# Patient Record
Sex: Male | Born: 1955
Health system: Southern US, Community
[De-identification: ages and names within clinical notes are randomized; demographics above are authoritative.]

## PROBLEM LIST (undated history)

## (undated) DIAGNOSIS — I495 Sick sinus syndrome: Secondary | ICD-10-CM

## (undated) DIAGNOSIS — E785 Hyperlipidemia, unspecified: Secondary | ICD-10-CM

## (undated) DIAGNOSIS — E119 Type 2 diabetes mellitus without complications: Secondary | ICD-10-CM

## (undated) DIAGNOSIS — D649 Anemia, unspecified: Secondary | ICD-10-CM

## (undated) DIAGNOSIS — I10 Essential (primary) hypertension: Secondary | ICD-10-CM

## (undated) DIAGNOSIS — Z9289 Personal history of other medical treatment: Secondary | ICD-10-CM

## (undated) DIAGNOSIS — K219 Gastro-esophageal reflux disease without esophagitis: Secondary | ICD-10-CM

## (undated) DIAGNOSIS — K8020 Calculus of gallbladder without cholecystitis without obstruction: Secondary | ICD-10-CM

## (undated) DIAGNOSIS — I480 Paroxysmal atrial fibrillation: Secondary | ICD-10-CM

## (undated) DIAGNOSIS — I2510 Atherosclerotic heart disease of native coronary artery without angina pectoris: Secondary | ICD-10-CM

## (undated) HISTORY — DX: Paroxysmal atrial fibrillation: I48.0

## (undated) HISTORY — DX: Sick sinus syndrome: I49.5

## (undated) HISTORY — DX: Gastro-esophageal reflux disease without esophagitis: K21.9

## (undated) HISTORY — DX: Calculus of gallbladder without cholecystitis without obstruction: K80.20

## (undated) HISTORY — DX: Personal history of other medical treatment: Z92.89

## (undated) HISTORY — DX: Anemia, unspecified: D64.9

## (undated) HISTORY — DX: Type 2 diabetes mellitus without complications: E11.9

## (undated) HISTORY — PX: OTHER SURGICAL HISTORY: SHX169

## (undated) HISTORY — PX: PACEMAKER INSERTION: SHX728

## (undated) HISTORY — DX: Essential (primary) hypertension: I10

## (undated) HISTORY — DX: Atherosclerotic heart disease of native coronary artery without angina pectoris: I25.10

## (undated) HISTORY — DX: Hyperlipidemia, unspecified: E78.5

## (undated) HISTORY — PX: CORONARY ARTERY BYPASS GRAFT: SHX141

---

## 2001-09-22 ENCOUNTER — Encounter: Payer: Self-pay | Admitting: General Surgery

## 2001-09-29 ENCOUNTER — Ambulatory Visit (HOSPITAL_COMMUNITY): Admission: RE | Admit: 2001-09-29 | Discharge: 2001-09-29 | Payer: Self-pay | Admitting: General Surgery

## 2001-09-29 ENCOUNTER — Encounter (INDEPENDENT_AMBULATORY_CARE_PROVIDER_SITE_OTHER): Payer: Self-pay | Admitting: *Deleted

## 2002-01-12 ENCOUNTER — Ambulatory Visit (HOSPITAL_COMMUNITY): Admission: RE | Admit: 2002-01-12 | Discharge: 2002-01-12 | Payer: Self-pay | Admitting: General Surgery

## 2002-01-12 ENCOUNTER — Encounter (INDEPENDENT_AMBULATORY_CARE_PROVIDER_SITE_OTHER): Payer: Self-pay | Admitting: *Deleted

## 2002-04-23 ENCOUNTER — Observation Stay (HOSPITAL_COMMUNITY): Admission: RE | Admit: 2002-04-23 | Discharge: 2002-04-24 | Payer: Self-pay | Admitting: General Surgery

## 2003-03-25 ENCOUNTER — Ambulatory Visit (HOSPITAL_COMMUNITY): Admission: RE | Admit: 2003-03-25 | Discharge: 2003-03-25 | Payer: Self-pay | Admitting: General Surgery

## 2006-08-25 ENCOUNTER — Inpatient Hospital Stay (HOSPITAL_COMMUNITY): Admission: EM | Admit: 2006-08-25 | Discharge: 2006-09-03 | Payer: Self-pay | Admitting: Emergency Medicine

## 2006-08-25 ENCOUNTER — Ambulatory Visit: Payer: Self-pay | Admitting: Cardiology

## 2006-08-26 ENCOUNTER — Encounter: Payer: Self-pay | Admitting: Cardiothoracic Surgery

## 2006-08-26 ENCOUNTER — Ambulatory Visit: Payer: Self-pay | Admitting: Cardiothoracic Surgery

## 2006-08-29 ENCOUNTER — Encounter: Payer: Self-pay | Admitting: Cardiology

## 2006-09-16 ENCOUNTER — Ambulatory Visit: Payer: Self-pay | Admitting: Cardiothoracic Surgery

## 2006-09-16 ENCOUNTER — Encounter: Admission: RE | Admit: 2006-09-16 | Discharge: 2006-09-16 | Payer: Self-pay | Admitting: Cardiothoracic Surgery

## 2006-09-19 ENCOUNTER — Ambulatory Visit: Payer: Self-pay | Admitting: Cardiology

## 2006-11-11 ENCOUNTER — Ambulatory Visit: Payer: Self-pay | Admitting: Cardiology

## 2006-11-11 LAB — CONVERTED CEMR LAB
ALT: 28 units/L (ref 0–53)
AST: 19 units/L (ref 0–37)
Albumin: 3.9 g/dL (ref 3.5–5.2)
Alkaline Phosphatase: 50 units/L (ref 39–117)
Bilirubin, Direct: 0.1 mg/dL (ref 0.0–0.3)
Cholesterol: 148 mg/dL (ref 0–200)
Direct LDL: 76.4 mg/dL
HDL: 42.9 mg/dL (ref >39.0)
Total Bilirubin: 0.6 mg/dL (ref 0.3–1.2)
Total CHOL/HDL Ratio: 3.4
Total Protein: 7.6 g/dL (ref 6.0–8.3)
Triglycerides: 201 mg/dL (ref 0–149)
VLDL: 40 mg/dL (ref 0–40)

## 2006-11-18 ENCOUNTER — Ambulatory Visit: Payer: Self-pay | Admitting: Cardiology

## 2006-12-02 ENCOUNTER — Encounter: Payer: Self-pay | Admitting: Cardiology

## 2006-12-02 ENCOUNTER — Ambulatory Visit: Payer: Self-pay

## 2007-05-19 ENCOUNTER — Ambulatory Visit: Payer: Self-pay | Admitting: Cardiology

## 2007-07-11 ENCOUNTER — Ambulatory Visit: Payer: Self-pay | Admitting: Cardiology

## 2007-07-21 ENCOUNTER — Ambulatory Visit: Payer: Self-pay

## 2007-12-15 ENCOUNTER — Ambulatory Visit: Payer: Self-pay | Admitting: Cardiology

## 2007-12-15 LAB — CONVERTED CEMR LAB
ALT: 27 units/L (ref 0–53)
AST: 19 units/L (ref 0–37)
Albumin: 3.6 g/dL (ref 3.5–5.2)
Alkaline Phosphatase: 40 units/L (ref 39–117)
Bilirubin, Direct: 0.1 mg/dL (ref 0.0–0.3)
Cholesterol: 138 mg/dL (ref 0–200)
HDL: 42.2 mg/dL (ref >39.0)
LDL Cholesterol: 62 mg/dL (ref 0–99)
Total Bilirubin: 0.7 mg/dL (ref 0.3–1.2)
Total CHOL/HDL Ratio: 3.3
Total Protein: 7.4 g/dL (ref 6.0–8.3)
Triglycerides: 168 mg/dL — ABNORMAL HIGH (ref 0–149)
VLDL: 34 mg/dL (ref 0–40)

## 2007-12-29 ENCOUNTER — Ambulatory Visit: Payer: Self-pay | Admitting: Cardiology

## 2008-01-11 ENCOUNTER — Ambulatory Visit: Payer: Self-pay | Admitting: Cardiology

## 2008-01-11 LAB — CONVERTED CEMR LAB
BUN: 17 mg/dL (ref 6–23)
CO2: 28 meq/L (ref 19–32)
Calcium: 10.2 mg/dL (ref 8.4–10.5)
Chloride: 101 meq/L (ref 96–112)
Creatinine, Ser: 0.9 mg/dL (ref 0.4–1.5)
GFR calc Af Amer: 114 mL/min
GFR calc non Af Amer: 94 mL/min
Glucose, Bld: 122 mg/dL — ABNORMAL HIGH (ref 70–99)
Potassium: 5.2 meq/L — ABNORMAL HIGH (ref 3.5–5.1)
Sodium: 137 meq/L (ref 135–145)

## 2008-02-02 ENCOUNTER — Ambulatory Visit: Payer: Self-pay | Admitting: Cardiology

## 2008-02-02 LAB — CONVERTED CEMR LAB
BUN: 13 mg/dL (ref 6–23)
CO2: 29 meq/L (ref 19–32)
Calcium: 9.7 mg/dL (ref 8.4–10.5)
Chloride: 105 meq/L (ref 96–112)
Creatinine, Ser: 1 mg/dL (ref 0.4–1.5)
GFR calc Af Amer: 101 mL/min
GFR calc non Af Amer: 83 mL/min
Glucose, Bld: 119 mg/dL — ABNORMAL HIGH (ref 70–99)
Potassium: 4.4 meq/L (ref 3.5–5.1)
Sodium: 139 meq/L (ref 135–145)

## 2008-02-09 ENCOUNTER — Ambulatory Visit: Payer: Self-pay | Admitting: Internal Medicine

## 2008-02-09 DIAGNOSIS — K219 Gastro-esophageal reflux disease without esophagitis: Secondary | ICD-10-CM | POA: Insufficient documentation

## 2008-02-09 DIAGNOSIS — I252 Old myocardial infarction: Secondary | ICD-10-CM | POA: Insufficient documentation

## 2008-02-09 DIAGNOSIS — E785 Hyperlipidemia, unspecified: Secondary | ICD-10-CM | POA: Insufficient documentation

## 2008-02-09 DIAGNOSIS — J019 Acute sinusitis, unspecified: Secondary | ICD-10-CM | POA: Insufficient documentation

## 2008-02-09 DIAGNOSIS — I495 Sick sinus syndrome: Secondary | ICD-10-CM | POA: Insufficient documentation

## 2008-02-09 DIAGNOSIS — I2510 Atherosclerotic heart disease of native coronary artery without angina pectoris: Secondary | ICD-10-CM | POA: Insufficient documentation

## 2008-02-09 DIAGNOSIS — I119 Hypertensive heart disease without heart failure: Secondary | ICD-10-CM | POA: Insufficient documentation

## 2008-02-09 LAB — CONVERTED CEMR LAB
ALT: 41 units/L (ref 0–53)
AST: 28 units/L (ref 0–37)
Albumin: 3.8 g/dL (ref 3.5–5.2)
Alkaline Phosphatase: 52 units/L (ref 39–117)
BUN: 13 mg/dL (ref 6–23)
Basophils Absolute: 0.1 10*3/uL (ref 0.0–0.1)
Basophils Relative: 1 % (ref 0.0–3.0)
Bilirubin Urine: NEGATIVE
Bilirubin, Direct: 0.1 mg/dL (ref 0.0–0.3)
CO2: 28 meq/L (ref 19–32)
Calcium: 9.9 mg/dL (ref 8.4–10.5)
Chloride: 106 meq/L (ref 96–112)
Cholesterol: 134 mg/dL (ref 0–200)
Creatinine, Ser: 1 mg/dL (ref 0.4–1.5)
Eosinophils Absolute: 0.3 10*3/uL (ref 0.0–0.7)
Eosinophils Relative: 3.5 % (ref 0.0–5.0)
GFR calc Af Amer: 101 mL/min
GFR calc non Af Amer: 83 mL/min
Glucose, Bld: 141 mg/dL — ABNORMAL HIGH (ref 70–99)
HCT: 40.7 % (ref 39.0–52.0)
HDL: 52.7 mg/dL (ref >39.0)
Hemoglobin, Urine: NEGATIVE
Hemoglobin: 13.9 g/dL (ref 13.0–17.0)
Hgb A1c MFr Bld: 6.4 % — ABNORMAL HIGH (ref 4.6–6.0)
Ketones, ur: NEGATIVE mg/dL
LDL Cholesterol: 55 mg/dL (ref 0–99)
Leukocytes, UA: NEGATIVE
Lymphocytes Relative: 33.8 % (ref 12.0–46.0)
MCHC: 34.2 g/dL (ref 30.0–36.0)
MCV: 92.7 fL (ref 78.0–100.0)
Monocytes Absolute: 0.8 10*3/uL (ref 0.1–1.0)
Monocytes Relative: 9.8 % (ref 3.0–12.0)
Neutro Abs: 4.3 10*3/uL (ref 1.4–7.7)
Neutrophils Relative %: 51.9 % (ref 43.0–77.0)
Nitrite: NEGATIVE
PSA: 0.26 ng/mL (ref 0.10–4.00)
Platelets: 235 10*3/uL (ref 150–400)
Potassium: 5.1 meq/L (ref 3.5–5.1)
RBC: 4.39 M/uL (ref 4.22–5.81)
RDW: 11.8 % (ref 11.5–14.6)
Sodium: 139 meq/L (ref 135–145)
Specific Gravity, Urine: 1.015 (ref 1.000–1.03)
TSH: 2.44 microintl units/mL (ref 0.35–5.50)
Total Bilirubin: 0.5 mg/dL (ref 0.3–1.2)
Total CHOL/HDL Ratio: 2.5
Total Protein, Urine: NEGATIVE mg/dL
Total Protein: 7.3 g/dL (ref 6.0–8.3)
Triglycerides: 133 mg/dL (ref 0–149)
Urine Glucose: NEGATIVE mg/dL
Urobilinogen, UA: 0.2 (ref 0.0–1.0)
VLDL: 27 mg/dL (ref 0–40)
WBC: 8.3 10*3/uL (ref 4.5–10.5)
pH: 5 (ref 5.0–8.0)

## 2008-03-01 ENCOUNTER — Ambulatory Visit: Payer: Self-pay | Admitting: Internal Medicine

## 2008-03-01 DIAGNOSIS — E119 Type 2 diabetes mellitus without complications: Secondary | ICD-10-CM | POA: Insufficient documentation

## 2008-04-05 ENCOUNTER — Ambulatory Visit: Payer: Self-pay | Admitting: Gastroenterology

## 2008-04-26 ENCOUNTER — Ambulatory Visit: Payer: Self-pay | Admitting: Gastroenterology

## 2008-04-26 LAB — HM COLONOSCOPY

## 2008-08-23 ENCOUNTER — Ambulatory Visit: Payer: Self-pay | Admitting: Internal Medicine

## 2008-08-23 LAB — CONVERTED CEMR LAB
BUN: 21 mg/dL (ref 6–23)
CO2: 28 meq/L (ref 19–32)
Calcium: 9.8 mg/dL (ref 8.4–10.5)
Chloride: 105 meq/L (ref 96–112)
Cholesterol: 141 mg/dL (ref 0–200)
Creatinine, Ser: 1.1 mg/dL (ref 0.4–1.5)
GFR calc non Af Amer: 74 mL/min (ref >60)
Glucose, Bld: 115 mg/dL — ABNORMAL HIGH (ref 70–99)
HDL: 53.6 mg/dL (ref >39.00)
Hgb A1c MFr Bld: 6.1 % (ref 4.6–6.5)
LDL Cholesterol: 61 mg/dL (ref 0–99)
Potassium: 5.1 meq/L (ref 3.5–5.1)
Sodium: 141 meq/L (ref 135–145)
Total CHOL/HDL Ratio: 3
Triglycerides: 130 mg/dL (ref 0.0–149.0)
VLDL: 26 mg/dL (ref 0.0–40.0)

## 2008-08-30 ENCOUNTER — Ambulatory Visit: Payer: Self-pay | Admitting: Internal Medicine

## 2008-08-30 DIAGNOSIS — R079 Chest pain, unspecified: Secondary | ICD-10-CM | POA: Insufficient documentation

## 2008-10-02 ENCOUNTER — Ambulatory Visit: Payer: Self-pay | Admitting: Internal Medicine

## 2008-10-02 DIAGNOSIS — R42 Dizziness and giddiness: Secondary | ICD-10-CM | POA: Insufficient documentation

## 2008-11-09 ENCOUNTER — Inpatient Hospital Stay (HOSPITAL_COMMUNITY): Admission: EM | Admit: 2008-11-09 | Discharge: 2008-11-12 | Payer: Self-pay | Admitting: Internal Medicine

## 2008-11-09 ENCOUNTER — Telehealth: Payer: Self-pay | Admitting: Family Medicine

## 2008-11-09 ENCOUNTER — Ambulatory Visit: Payer: Self-pay | Admitting: Diagnostic Radiology

## 2008-11-09 ENCOUNTER — Encounter: Payer: Self-pay | Admitting: Emergency Medicine

## 2008-11-09 ENCOUNTER — Ambulatory Visit: Payer: Self-pay | Admitting: Cardiovascular Disease

## 2008-11-11 ENCOUNTER — Encounter: Payer: Self-pay | Admitting: Internal Medicine

## 2008-11-12 ENCOUNTER — Encounter: Payer: Self-pay | Admitting: Internal Medicine

## 2008-11-18 ENCOUNTER — Encounter: Payer: Self-pay | Admitting: Internal Medicine

## 2008-11-26 ENCOUNTER — Telehealth (INDEPENDENT_AMBULATORY_CARE_PROVIDER_SITE_OTHER): Payer: Self-pay | Admitting: *Deleted

## 2008-11-27 ENCOUNTER — Encounter (HOSPITAL_COMMUNITY): Admission: RE | Admit: 2008-11-27 | Discharge: 2009-01-08 | Payer: Self-pay | Admitting: Internal Medicine

## 2008-11-27 ENCOUNTER — Ambulatory Visit: Payer: Self-pay | Admitting: Cardiology

## 2008-11-27 ENCOUNTER — Encounter: Payer: Self-pay | Admitting: Internal Medicine

## 2008-11-27 ENCOUNTER — Ambulatory Visit: Payer: Self-pay

## 2008-12-11 ENCOUNTER — Ambulatory Visit: Payer: Self-pay | Admitting: Cardiology

## 2008-12-11 DIAGNOSIS — I4891 Unspecified atrial fibrillation: Secondary | ICD-10-CM | POA: Insufficient documentation

## 2008-12-16 LAB — CONVERTED CEMR LAB
ALT: 30 units/L (ref 0–53)
AST: 18 units/L (ref 0–37)
Albumin: 4 g/dL (ref 3.5–5.2)
Alkaline Phosphatase: 36 units/L — ABNORMAL LOW (ref 39–117)
Bilirubin, Direct: 0 mg/dL (ref 0.0–0.3)
Cholesterol: 137 mg/dL (ref 0–200)
HDL: 46.1 mg/dL (ref >39.00)
LDL Cholesterol: 62 mg/dL (ref 0–99)
Total Bilirubin: 0.6 mg/dL (ref 0.3–1.2)
Total CHOL/HDL Ratio: 3
Total Protein: 7.3 g/dL (ref 6.0–8.3)
Triglycerides: 143 mg/dL (ref 0.0–149.0)
VLDL: 28.6 mg/dL (ref 0.0–40.0)

## 2009-02-14 ENCOUNTER — Ambulatory Visit: Payer: Self-pay | Admitting: Internal Medicine

## 2009-02-28 ENCOUNTER — Ambulatory Visit: Payer: Self-pay | Admitting: Internal Medicine

## 2009-02-28 LAB — CONVERTED CEMR LAB
BUN: 16 mg/dL (ref 6–23)
CO2: 27 meq/L (ref 19–32)
Calcium: 9.6 mg/dL (ref 8.4–10.5)
Chloride: 105 meq/L (ref 96–112)
Creatinine, Ser: 0.9 mg/dL (ref 0.4–1.5)
Creatinine,U: 88.2 mg/dL
GFR calc non Af Amer: 93.1 mL/min (ref >60)
Glucose, Bld: 134 mg/dL — ABNORMAL HIGH (ref 70–99)
Hgb A1c MFr Bld: 6.5 % (ref 4.6–6.5)
Microalb Creat Ratio: 5.7 mg/g (ref 0.0–30.0)
Microalb, Ur: 0.5 mg/dL (ref 0.0–1.9)
PSA: 0.3 ng/mL (ref 0.10–4.00)
Potassium: 4.5 meq/L (ref 3.5–5.1)
Sodium: 138 meq/L (ref 135–145)

## 2009-03-26 ENCOUNTER — Encounter (INDEPENDENT_AMBULATORY_CARE_PROVIDER_SITE_OTHER): Payer: Self-pay | Admitting: *Deleted

## 2009-04-21 ENCOUNTER — Encounter (INDEPENDENT_AMBULATORY_CARE_PROVIDER_SITE_OTHER): Payer: Self-pay | Admitting: *Deleted

## 2009-06-27 ENCOUNTER — Ambulatory Visit: Payer: Self-pay | Admitting: Cardiology

## 2009-06-27 LAB — CONVERTED CEMR LAB
ALT: 32 units/L (ref 0–53)
AST: 25 units/L (ref 0–37)
Albumin: 4.2 g/dL (ref 3.5–5.2)
Alkaline Phosphatase: 33 units/L — ABNORMAL LOW (ref 39–117)
Bilirubin, Direct: 0.2 mg/dL (ref 0.0–0.3)
Cholesterol: 156 mg/dL (ref 0–200)
Direct LDL: 64.8 mg/dL
HDL: 61.3 mg/dL (ref >39.00)
Total Bilirubin: 0.9 mg/dL (ref 0.3–1.2)
Total CHOL/HDL Ratio: 3
Total Protein: 7.8 g/dL (ref 6.0–8.3)
Triglycerides: 214 mg/dL — ABNORMAL HIGH (ref 0.0–149.0)
VLDL: 42.8 mg/dL — ABNORMAL HIGH (ref 0.0–40.0)

## 2009-09-05 ENCOUNTER — Ambulatory Visit: Payer: Self-pay | Admitting: Internal Medicine

## 2009-09-05 DIAGNOSIS — J3489 Other specified disorders of nose and nasal sinuses: Secondary | ICD-10-CM | POA: Insufficient documentation

## 2009-12-19 ENCOUNTER — Ambulatory Visit: Payer: Self-pay | Admitting: Internal Medicine

## 2010-01-14 ENCOUNTER — Telehealth: Payer: Self-pay | Admitting: Gastroenterology

## 2010-02-10 NOTE — Progress Notes (Signed)
Summary: Nuclear Pre-Procedure  Phone Note Outgoing Call Call back at [facility] Phone [phone]   Call placed by: Stanton Kidney, EMT-P,  November 26, 2008 1:42 PM Call placed to: Patient Action Taken: Phone Call Completed Summary of Call: Reviewed information on Myoview Information Sheet (see scanned document for further details).  Spoke with Pt.     Nuclear Med Background Indications for Stress Test: Evaluation for Ischemia, Graft Patency, Post Hospital  Indications Comments: 11/11/08 Syncope/symptomatic brady. 11/12/08 Post Op. (PTVP)> A.fib  History: CABG, Echo, Myocardial Infarction, Myocardial Perfusion Study, Pacemaker  History Comments: 8/08 MI: NSTEMI> CABGx4 7/09 MPS: EF= 69%, (-) ischemia 11/10 Echo: EF=60-65% 11/10 Pacemaker  Symptoms: Dizziness, Fatigue, Fatigue with Exertion, Light-Headedness, Near Syncope, Syncope    Nuclear Pre-Procedure Cardiac Risk Factors: Family History - CAD, Hypertension, Lipids, NIDDM, Smoker Height (in): 63

## 2010-02-10 NOTE — Cardiovascular Report (Signed)
Summary: Office Visit   Office Visit   Imported By: Roderic Ovens 02/18/2009 11:34:13  _____________________________________________________________________  External Attachment:    Type:   Image     Comment:   External Document

## 2010-02-10 NOTE — Letter (Signed)
Summary: [facility]  [address] [place], [place] [address]   Phone: [phone]  Fax: [phone]    Today's Date: October 02, 2008  Name of Patient: [name] [name] Authier  The above named patient had a medical visit today at:  2:15 pm.  Please take this into consideration when reviewing the time away from work/school.    Special Instructions:  [ x ] None  [  ] To be off the remainder of today, returning to the normal work / school schedule tomorrow.  [  ] To be off until the next scheduled appointment on ______________________.  [  ] Other ________________________________________________________________ ________________________________________________________________________   Sincerely yours,   Windell Norfolk

## 2010-02-10 NOTE — Assessment & Plan Note (Signed)
Summary: 9 MO F/U MEDTRONIC  Medications Added LISINOPRIL 40 MG TABS (LISINOPRIL) 1/2 po two times a day      Allergies Added: NKDA  Primary Myleen Brailsford:  Corwin Levins MD   History of Present Illness: The patient presents today for routine electrophysiology followup. He reports doing very well since last being seen in our clinic. The patient denies symptoms of palpitations, chest pain, shortness of breath, orthopnea, PND, lower extremity edema, dizziness, presyncope, syncope, or neurologic sequela. The patient is tolerating medications without difficulties and is otherwise without complaint today.   He states that his BP is elevated in the morning but improved later in the day after he takes his medicine.  Current Medications (verified): 1)  Metoprolol Succinate 100 Mg Xr24h-Tab (Metoprolol Succinate) .[name].. 1po Once Daily 2)  Lisinopril 40 Mg Tabs (Lisinopril) .[name].. 1 P Odaily 3)  Lipitor 80 Mg Tabs (Atorvastatin Calcium) .[name].. 1 Tab Daily 4)  Omega-3 Fish Oil 1200 Mg Caps (Omega-3 Fatty Acids) .[name].. 1 Tab Twice Daily 5)  Aspirin 325 Mg  Tabs (Aspirin) .[name].. 1 By Mouth Daily 6)  Acetaminophen 325 Mg  Tabs (Acetaminophen) .... As Needed 7)  Omeprazole 20 Mg Cpdr (Omeprazole) .... 2 By Mouth Once Daily 8)  Mupirocin 2 % Oint (Mupirocin) .... Use Asd Three Times A Day For 7 Days  Allergies (verified): No Known Drug Allergies  Past History:  Past Medical History: Reviewed history from 12/11/2008 and no changes required. 1. Coronary artery disease.  The patient presented in August 2008 with acute coronary syndrome.  He did have a bypass surgery.  He did have 3-vessel disease on heart catheterization and had bypass surgery with LIMA to the LAD, sequential vein graft to the obtuse marginal-1 and obtuse marginal-2, and vein graft to the RCA.  Adenosine myoview (11/10): EF 64%, normal wall motion, normal perfusion.  2. Echocardiogram, November 2008, EF 65-70%.  No regional wall motion abnormalities.   Trivial mitral regurgitation. 3. Gastroesophageal reflux disease. 4. Hypertension. 5. Hyperlipidemia. 6. History of tobacco abuse.  The patient quit smoking in 2008.  7. Sick sinus syndrome with presyncope: Medtronic dual chamber PCM placed 11/10.  8. DIABETES MELLITUS, TYPE II (ICD-250.00) 9. hx of post op mild anemia 8/08 10.  Paroxysmal atrial fibrillation: only episode noted briefly during hospitalizatoin for PCM placement in 11/10.   Past Surgical History: Reviewed history from 02/14/2009 and no changes required. Coronary artery bypass graft s/p rectal abscess 2005 - dr Carolynne Edouard - with interanal sphincterotomy Pacemaker implantation  Social History: Reviewed history from 02/09/2008 and no changes required. 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   Vital Signs:  Patient profile:   55 year old male Height:      64 inches Weight:      163 pounds Pulse rhythm:   68 BP sitting:   160 / 90  Vitals Entered By: Dennis Bast, RN, BSN (December 19, 2009 10:12 AM)  Physical Exam  General:  Well developed, well nourished, in no acute distress. Head:  normocephalic and atraumatic Eyes:  PERRLA/EOM intact; conjunctiva and lids normal. Mouth:  Teeth, gums and palate normal. Oral mucosa normal. Neck:  Neck supple, no JVD. No masses, thyromegaly or abnormal cervical nodes. Chest Wall:  pacemaker site is  well healed Lungs:  Clear bilaterally to auscultation and percussion. Heart:  RRR, no m/r/g Abdomen:  Bowel sounds positive; abdomen soft and non-tender without masses, organomegaly, or hernias noted. No hepatosplenomegaly. Msk:  Back normal, normal gait. Muscle strength and tone normal. Extremities:  No clubbing or cyanosis. Neurologic:  Alert and oriented x 3. Skin:  Intact without  lesions or rashes. Psych:  Normal affect.   PPM Specifications Following MD:  Hillis Range, MD     PPM Vendor:  Medtronic     PPM Model Number:  [id number]     PPM Serial Number:  [id number] PPM DOI:  11/11/2008     PPM Implanting MD:  Hillis Range, MD  Lead 1    Location: RA     DOI: 11/11/2008     Model #: [id number]     Serial #: [id number]     Status: active Lead 2    Location: RV     DOI: 11/11/2008     Model #: [id number]     Serial #: [id number]     Status: active  Magnet Response Rate:  BOL 85 ERI  65  Indications:  Sick sinus syndrome   PPM Follow Up Remote Check?  No Battery Voltage:  2.79 V     Battery Est. Longevity:  13.5 years     Pacer Dependent:  Yes       PPM Device Measurements Atrium  Impedance: 582 ohms, Threshold: 0.75 V at 0.4 msec Right Ventricle  Amplitude: 22.40 mV, Impedance: 500 ohms, Threshold: 0.75 V at 0.4 msec  Episodes MS Episodes:  0     Percent Mode Switch:  0     Coumadin:  No Ventricular High Rate:  0     Atrial Pacing:  80.9%     Ventricular Pacing:  0.2%  Parameters Mode:  MVP (R)     Lower Rate Limit:  60     Upper Rate Limit:  130 Paced AV Delay:  150     Sensed AV Delay:  120 Next Remote Date:  03/19/2010     Next Cardiology Appt Due:  12/12/2010 Tech Comments:  No parameter changes.  Device function normal.  Carelink transmissions every 3 months.  ROV 1 year with Dr. Johney Frame. Altha Harm, LPN  December 19, 2009 10:25 AM  MD Comments:  normal pacemkaer function no changes today  Impression & Recommendations:  Problem # 1:  BRADYCARDIA (ICD-427.89)  stable normal pacemaker function  His updated medication list for this problem includes:    Metoprolol Succinate 100 Mg Xr24h-tab (Metoprolol succinate) .[name]... 1po once daily    Lisinopril 40 Mg Tabs (Lisinopril) .[name]... 1 p odaily    Aspirin 325 Mg Tabs (Aspirin) .[name]... 1 by mouth daily  Problem # 2:  HYPERTENSION (ICD-401.9) above goal in the am, but per pt controlled in the afternoon I have  instructed him to split lisinopril and take 20mg  two times a day if his BP remains elevated, I would recommend HCTZ.  Patient Instructions: 1)   Your physician wants you to follow-up in:12 months with Dr Johney Frame   Bonita Quin will receive a reminder letter in the mail two months in advance. If you don't receive a letter, please call our office to schedule the follow-up appointment. 2)  Your physician has recommended you make the following change in your medication: take Lisinopril 1/2 in the am and 1/2 in the pm

## 2010-02-10 NOTE — Cardiovascular Report (Signed)
Summary: Office Visit   Office Visit   Imported By: Roderic Ovens 12/23/2008 15:42:18  _____________________________________________________________________  External Attachment:    Type:   Image     Comment:   External Document

## 2010-02-10 NOTE — Procedures (Signed)
Summary: Colonoscopy   Colonoscopy  Procedure date:  04/26/2008  Findings:      Location:  [facility].    Procedures Next Due Date:    Colonoscopy: 05/2009  COLONOSCOPY PROCEDURE REPORT PATIENT:  [name] [name], [name] [name]  MR#:  [id number] BIRTHDATE:   [birth year], 53 yrs. old   GENDER:   male ENDOSCOPIST:   Rachael Fee, MD Referred by: Oliver Barre, M.D. PROCEDURE DATE:  04/26/2008 PROCEDURE:  Colonoscopy, diagnostic ASA CLASS:   Class II INDICATIONS: colorectal cancer screening, average risk  MEDICATIONS:    Versed 7 mg IV, Fentanyl 50 mcg IV DESCRIPTION OF PROCEDURE:   After the risks benefits and alternatives of the procedure were thoroughly explained, informed consent was obtained.  Digital rectal exam was performed and revealed no rectal masses.   The LB CF-[id number] endoscope was introduced through the anus and advanced to the cecum, which was identified by both the appendix and ileocecal valve, without limitations.  The quality of the prep was poor, using MoviPrep.  The instrument was then slowly withdrawn as the colon was fully examined. <<PROCEDUREIMAGES>>        <<OLD IMAGES>> FINDINGS:  Poor prep limited this examination (see image1 and image4).  Internal and external hemorrhoids were found. These were small and non-thrombosed.  This was otherwise a normal examination of the colon (see image2, image3, and image5).   Retroflexed views in the rectum revealed no abnormalities.    The scope was then withdrawn from the patient and the procedure completed. COMPLICATIONS:   None  ENDOSCOPIC IMPRESSION:  1) Poor prep (exam limited), small lesions may have been missed  2) Internal and external hemorrhoids  3) Otherwise normal examination  RECOMMENDATIONS:  Given poor prep (he drank both MovePrep doses last night instead of splitting them as recommended) he will need repeat colonoscopy in 1 year.  REPEAT EXAM:   colonoscopy in 1  year  _______________________________ Rachael Fee, MD    CC: Oliver Barre, MD

## 2010-02-10 NOTE — Assessment & Plan Note (Signed)
Summary: PER CHECK OUT/SF      Allergies Added: NKDA  Primary Provider:  Corwin Levins MD   History of Present Illness: 55 yo with history of CAD s/p CABG and bradycardia s/p pacemaker presents for cardiology followup.  Patient has been doing well.  He is working full time.  No exertional dyspnea or chest pain.  No palpitations or lightheadedness. BP is mildly elevated today but he checks it frequently at home and he says that it has always been running systolic < 140.    Labs (12/10): LDL 62, HDL 46, LFTs normal  ECG: a-paced, v-sensed, lateral T wave inversions  Current Medications (verified): 1)  Metoprolol Tartrate 50 Mg Tabs (Metoprolol Tartrate) .... One Tablet Twice A Day 2)  Lisinopril 40 Mg Tabs (Lisinopril) .[name].. 1 P Odaily 3)  Lipitor 80 Mg Tabs (Atorvastatin Calcium) .[name].. 1 Tab Daily 4)  Omega-3 Fish Oil 1200 Mg Caps (Omega-3 Fatty Acids) .[name].. 1 Tab Daily 5)  Aspirin 325 Mg  Tabs (Aspirin) .[name].. 1 By Mouth Daily 6)  Acetaminophen 325 Mg  Tabs (Acetaminophen) .... As Needed  Allergies (verified): No Known Drug Allergies  Past History:  Past Medical History: Reviewed history from 12/11/2008 and no changes required. 1. Coronary artery disease.  The patient presented in August 2008 with acute coronary syndrome.  He did have a bypass surgery.  He did have 3-vessel disease on heart catheterization and had bypass surgery with LIMA to the LAD, sequential vein graft to the obtuse marginal-1 and obtuse marginal-2, and vein graft to the RCA.  Adenosine myoview (11/10): EF 64%, normal wall motion, normal perfusion.  2. Echocardiogram, November 2008, EF 65-70%.  No regional wall motion abnormalities.  Trivial mitral regurgitation. 3. Gastroesophageal reflux disease. 4. Hypertension. 5. Hyperlipidemia. 6. History of tobacco abuse.  The patient quit smoking in 2008.  7. Sick sinus syndrome with presyncope: Medtronic dual chamber PCM placed 11/10.  8. DIABETES MELLITUS, TYPE II  (ICD-250.00) 9. hx of post op mild anemia 8/08 10.  Paroxysmal atrial fibrillation: only episode noted briefly during hospitalizatoin for PCM placement in 11/10.   Family History: Reviewed history from 02/09/2008 and no changes required. family in [place] - history unknown, parents deceased  Social History: Reviewed history from 02/09/2008 and no changes required. 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 Neck:  Neck supple, no JVD. No masses, thyromegaly or abnormal cervical nodes. Lungs:  Clear bilaterally to auscultation and percussion. Heart:  Non-displaced PMI, chest non-tender; regular rate and rhythm, S1, S2 without murmurs, rubs or gallops. Carotid upstroke normal, no bruit. Pedals normal pulses. No edema, no varicosities. Abdomen:  Bowel sounds positive; abdomen soft and non-tender without masses, organomegaly, or hernias noted. No hepatosplenomegaly. Extremities:  No clubbing or cyanosis. Neurologic:  Alert and oriented x 3. Psych:  Normal affect.   PPM Specifications Following MD:  Hillis Range, MD  PPM Vendor:  Medtronic     PPM Model Number:  [id number]     PPM Serial Number:  [id number] PPM DOI:  11/11/2008     PPM Implanting MD:  Hillis Range, MD  Lead 1    Location: RA     DOI: 11/11/2008     Model #: [id number]     Serial #: [id number]     Status: active Lead 2    Location: RV     DOI: 11/11/2008     Model #: 6213     Serial #: [id number]     Status: active  Magnet Response Rate:  BOL 85 ERI   65  Indications:  Sick sinus syndrome   PPM Follow Up Pacer Dependent:  Yes      Episodes Coumadin:  No  Parameters Mode:  MVP (R)     Lower Rate Limit:  60     Upper Rate Limit:  130 Paced AV Delay:  150     Sensed AV Delay:  120  Impression & Recommendations:  Problem # 1:  CORONARY ARTERY DISEASE (ICD-414.00) Stable s/p CABG with no ischemic symptoms.  Continue ASA, ACEI, beta blocker, and statin.   Problem # 2:  HYPERLIPIDEMIA (ICD-272.4) Check lipids/LFTs.   Problem # 3:  HYPERTENSION (ICD-401.9) BP a bit elevated today but has been normal at home.  No change to meds at this time.   Problem # 4:  BRADYCARDIA (ICD-427.89) s/p pacemaker, getting regular followup.   Other Orders: EKG w/ Interpretation (93000) TLB-Lipid Panel (80061-LIPID) TLB-Hepatic/Liver Function Pnl (80076-HEPATIC)  Patient Instructions: 1)  Your physician recommends that you have lab today---lipid/liver profile 414.00  272.4 2)  Your physician wants you to follow-up in: 6 months with Dr Shirlee Latch.  You will receive a reminder letter in the mail two months in advance. If you don't receive a letter, please call our office to schedule the follow-up appointment.

## 2010-02-10 NOTE — Miscellaneous (Signed)
Summary: [facility] Previsit/prep  Clinical Lists Changes  Medications: Added new medication of MOVIPREP 100 GM  SOLR (PEG-KCL-NACL-NASULF-NA ASC-C) As per prep instructions. - Signed Rx of MOVIPREP 100 GM  SOLR (PEG-KCL-NACL-NASULF-NA ASC-C) As per prep instructions.;  #1 x 0;  Signed;  Entered by: Wyona Almas RN;  Authorized by: Rachael Fee MD;  Method used: Electronically to [facility] [address], [place], [place], [place]  [address], Ph: [phone], Fax: [phone] Observations: Added new observation of NKA: T (04/05/2008 9:51)    Prescriptions: MOVIPREP 100 GM  SOLR (PEG-KCL-NACL-NASULF-NA ASC-C) As per prep instructions.  #1 x 0   Entered by:   Wyona Almas RN   Authorized by:   Rachael Fee MD   Signed by:   Wyona Almas RN on 04/05/2008   Method used:   Electronically to        [facility] [address] (retail)       [address]       [place], [place]  [address]       Ph: [phone]       Fax: [phone]   RxID:   [id number]

## 2010-02-10 NOTE — Assessment & Plan Note (Signed)
Summary: 6 MO FU/$50/PN   Vital Signs:  Patient profile:   55 year old male Height:      61 inches Weight:      158.13 pounds BMI:     29.99 O2 Sat:      97 % Temp:     97.8 degrees F oral Pulse rate:   50 / minute BP sitting:   122 / 84  (left arm) Cuff size:   regular  Vitals Entered By: Windell Norfolk (August 30, 2008 9:13 AM) CC: 6 month f/u   CC:  6 month f/u.  History of Present Illness: overall doing well, has some mild fleeting sharp CP tender to palpate, worse to move left shoulder and pleuritic to the mid and left chest, but non radaiating, and no SOB, n/v, diaphoresis, palp or syncope.  No typical angina symtpoms.  Trying to follow lower chol diet, Pt denies polydipsia, polyuria, or low sugar symptoms such as shakiness improved with eating.  Overall good compliance with meds, trying to follow low chol diet, wt stable, little excercise however and cant seem to lose.  Has some mild nasal allergy symptoms as well with mild congestion and sneezing, itching for several weeks.  No reflux symptoms,  does treadmill at home at 3 miles per hour for one hour most days but afraid to go faster or longer   Problems Prior to Update: 1)  Coronary Artery Disease  (ICD-414.00) 2)  Hypertension  (ICD-401.9) 3)  Hyperlipidemia  (ICD-272.4) 4)  Bradycardia  (ICD-427.89) 5)  Gerd  (ICD-530.81) 6)  Diabetes Mellitus, Type II  (ICD-250.00) 7)  Acute Sinusitis, Unspecified  (ICD-461.9) 8)  Myocardial Infarction, Hx of  (ICD-412) 9)  Preventive Health Care  (ICD-V70.0)  Medications Prior to Update: 1)  Metoprolol Tartrate 50 Mg Tabs (Metoprolol Tartrate) .... 1/2 Tab Daily 2)  Lisinopril 40 Mg Tabs (Lisinopril) .[name].. 1  Tab Daily 3)  Lipitor 80 Mg Tabs (Atorvastatin Calcium) .[name].. 1 Tab Daily 4)  Omeprazole 20 Mg Tbec (Omeprazole) .[name].. 1 Tab Daily 5)  Omega-3 Fish Oil 1200 Mg Caps (Omega-3 Fatty Acids) .[name].. 1 Tab Daily 6)  Adult Aspirin Ec Low Strength 81 Mg Tbec (Aspirin) .[name].. 1po Once  Daily 7)  Azithromycin 250 Mg Tabs (Azithromycin) .... 2po Qd For 1 Day, Then 1po Qd For 4days, Then Stop  Current Medications (verified): 1)  Metoprolol Tartrate 50 Mg Tabs (Metoprolol Tartrate) .... 1/2 Tab Daily 2)  Lisinopril 40 Mg Tabs (Lisinopril) .[name].. 1  Tab Daily 3)  Lipitor 80 Mg Tabs (Atorvastatin Calcium) .[name].. 1 Tab Daily 4)  Omeprazole 20 Mg Tbec (Omeprazole) .[name].. 1 Tab Daily 5)  Omega-3 Fish Oil 1200 Mg Caps (Omega-3 Fatty Acids) .[name].. 1 Tab Daily 6)  Adult Aspirin Ec Low Strength 81 Mg Tbec (Aspirin) .[name].. 1po Once Daily 7)  Azithromycin 250 Mg Tabs (Azithromycin) .... 2po Qd For 1 Day, Then 1po Qd For 4days, Then Stop  Allergies (verified): No Known Drug Allergies  Past History:  Past Medical History: Last updated: 05/06/2008 CORONARY ARTERY DISEASE (ICD-414.00) HYPERTENSION (ICD-401.9) HYPERLIPIDEMIA (ICD-272.4) BRADYCARDIA (ICD-427.89) GERD (ICD-530.81) DIABETES MELLITUS, TYPE II (ICD-250.00) ACUTE SINUSITIS, UNSPECIFIED (ICD-461.9) MYOCARDIAL INFARCTION, HX OF (ICD-412) PREVENTIVE HEALTH CARE (ICD-V70.0) hx of post op mild anemia 8/08  Past Surgical History: Last updated: 02/09/2008 Coronary artery bypass graft s/p rectal abscess 2005 - dr Carolynne Edouard - with interanal sphincterotomy  Social History: Last updated: 02/09/2008 Married 3 children 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   Head:  normocephalic and atraumatic.   Eyes:  vision grossly intact, pupils equal, and pupils round.   Ears:  R ear normal and L ear normal.   Nose:  no external deformity and no nasal discharge.   Mouth:  no gingival abnormalities and pharynx pink and moist.   Neck:  supple and no masses.   Lungs:  normal  respiratory effort and normal breath sounds.   Heart:  normal rate and regular rhythm.   Abdomen:  soft, non-tender, and normal bowel sounds.   Msk:  no joint tenderness and no joint swelling.   Extremities:  no edema, no erythema    Impression & Recommendations:  Problem # 1:  CHEST PAIN (ICD-786.50) atypical c/w MSK - ok to follow for now, reassured  Problem # 2:  CORONARY ARTERY DISEASE (ICD-414.00)  His updated medication list for this problem includes:    Metoprolol Tartrate 50 Mg Tabs (Metoprolol tartrate) .[name]... 1/2 tab daily    Lisinopril 40 Mg Tabs (Lisinopril) .[name]... 1  tab daily    Adult Aspirin Ec Low Strength 81 Mg Tbec (Aspirin) .[name]... 1po once daily  Orders: Cardiology Referral (Cardiology) will be soon due for card eval, o/w stable overall by hx and exam, ok to continue meds/tx as is   Problem # 3:  HYPERTENSION (ICD-401.9)  His updated medication list for this problem includes:    Metoprolol Tartrate 50 Mg Tabs (Metoprolol tartrate) .[name]... 1/2 tab daily    Lisinopril 40 Mg Tabs (Lisinopril) .[name]... 1  tab daily  BP today: 122/84 Prior BP: 130/80 (03/01/2008)  Labs Reviewed: K+: 5.1 (08/23/2008) Creat: : 1.1 (08/23/2008)   Chol: 141 (08/23/2008)   HDL: 53.60 (08/23/2008)   LDL: 61 (08/23/2008)   TG: 130.0 (08/23/2008) stable overall by hx and exam, ok to continue meds/tx as is   Problem # 4:  DIABETES MELLITUS, TYPE II (ICD-250.00)  His updated medication list for this problem includes:    Lisinopril 40 Mg Tabs (Lisinopril) .[name]... 1  tab daily    Adult Aspirin Ec Low Strength 81 Mg Tbec (Aspirin) .[name]... 1po once daily does not require OHA at this time - cont diet and wt loss efforts  Problem # 5:  CORONARY ARTERY DISEASE (ICD-414.00)  His updated medication list for this problem includes:    Metoprolol Tartrate 50 Mg Tabs (Metoprolol tartrate) .[name]... 1/2 tab daily    Lisinopril 40 Mg Tabs (Lisinopril) .[name]... 1  tab daily    Adult Aspirin Ec Low Strength 81 Mg  Tbec (Aspirin) .[name]... 1po once daily will ask pt to make ROV to dr Shirlee Latch about dec 2010  Orders: Cardiology Referral (Cardiology)  Complete Medication List: 1)  Metoprolol Tartrate 50 Mg Tabs (Metoprolol tartrate) .... 1/2 tab daily 2)  Lisinopril 40 Mg Tabs (Lisinopril) .[name].. 1  tab daily 3)  Lipitor 80 Mg Tabs (Atorvastatin calcium) .[name].. 1 tab daily 4)  Omeprazole 20 Mg Tbec (Omeprazole) .[name].. 1 tab daily 5)  Omega-3 Fish Oil 1200 Mg Caps (Omega-3 fatty acids) .[name].. 1 tab daily 6)  Adult Aspirin Ec Low Strength 81 Mg Tbec (Aspirin) .[name].. 1po once daily 7)  Azithromycin 250 Mg Tabs (Azithromycin) .... 2po qd for 1 day, then 1po  qd for 4days, then stop  Patient Instructions: 1)  You will be contacted about the referral(s) to: cardiology for december 2010 2)  Continue all previous medications as before this visit  3)  Please schedule a follow-up appointment in 6 months with  CPX labs and: 4)  HbgA1C prior to visit, ICD-9: 250.02 5)  you can take OTC claritin for the allergies as needed

## 2010-02-10 NOTE — Letter (Signed)
Summary: Appointment - Reminder 2  [facility], Main Office  [address]   [place], [place] [address]   Phone: [phone]  Fax: [phone]     April 21, 2009 MRN: [id number]   Adain Grams [address] [place], [place]  [address]   Dear Mr. GASSETT,  Our records indicate that it is time to schedule a follow-up appointment with Dr. Shirlee Latch. It is very important that we reach you to schedule this appointment. We look forward to participating in your health care needs. Please contact us at the number listed above at your earliest convenience to schedule your appointment.  If you are unable to make an appointment at this time, give [place] a call so we can update our records.     Sincerely,    [facility] Scheduling Team

## 2010-02-10 NOTE — Procedures (Signed)
Summary: wound check medtronic      Allergies Added: NKDA  Current Medications (verified): 1)  Metoprolol Tartrate 50 Mg Tabs (Metoprolol Tartrate) .... 1/2 Tab Daily 2)  Lisinopril 20 Mg Tabs (Lisinopril) .[name].. 1po Once Daily 3)  Lipitor 80 Mg Tabs (Atorvastatin Calcium) .[name].. 1 Tab Daily 4)  Omeprazole 20 Mg Tbec (Omeprazole) .[name].. 1 Tab Daily 5)  Omega-3 Fish Oil 1200 Mg Caps (Omega-3 Fatty Acids) .[name].. 1 Tab Daily 6)  Adult Aspirin Ec Low Strength 81 Mg Tbec (Aspirin) .[name].. 1po Once Daily  Allergies (verified): No Known Drug Allergies  PPM Specifications Following MD:  Hillis Range, MD     PPM Vendor:  Medtronic     PPM Model Number:  [id number]     PPM Serial Number:  [id number] PPM DOI:  11/11/2008     PPM Implanting MD:  Hillis Range, MD  Lead 1    Location: RA     DOI: 11/11/2008     Model #: [id number]     Serial #: [id number]     Status: active Lead 2    Location: RV     DOI: 11/11/2008     Model #: [id number]     Serial #: [id number]     Status: active  Magnet Response Rate:  BOL 85 ERI  65  Indications:  Sick sinus syndrome   PPM Follow Up Remote Check?  No Battery Voltage:  2.79 V     Battery Est. Longevity:  8.5 YEARS     Pacer Dependent:  Yes       PPM Device Measurements Atrium  Amplitude: PACED AT 30 mV, Impedance: 628 ohms, Threshold: 0.5 V at 0.4 msec Right Ventricle  Amplitude: 22.4 mV, Impedance: 561 ohms, Threshold: 0.75 V at 0.4 msec  Episodes MS Episodes:  4     Percent Mode Switch:  <0.1%     Coumadin:  No Ventricular High Rate:  0     Atrial Pacing:  90.4%     Ventricular Pacing:  0.4%  Parameters Mode:  MVP (R)     Lower Rate Limit:  60     Upper Rate Limit:  130 Paced AV Delay:  150     Sensed AV Delay:  120 Next Cardiology Appt Due:  02/11/2009 Tech Comments:  Wound check appt today. Steri strips removed.  Wound without redness or edema.  Normal device function.  All mode swith episodes were less than 1 minute, no EGM's available.  Pt educated on wound care and  arm restrictions.  No changes made today.  ROV 2-11 JA as scheduled. Gypsy Balsam RN BSN  November 27, 2008 8:50 AM

## 2010-02-10 NOTE — Consult Note (Signed)
Summary: [facility]   Imported By: Roderic Ovens 12/03/2008 15:24:27  _____________________________________________________________________  External Attachment:    Type:   Image     Comment:   External Document

## 2010-02-10 NOTE — Assessment & Plan Note (Signed)
Summary: [name]  PHONE  STC   Vital Signs:  Patient Profile:   55 Years Old Male Height:     64 inches Weight:      156.13 pounds BMI:     26.90 Temp:     98.5 degrees F oral Pulse rate:   60 / minute Pulse rhythm:   regular Resp:     18 per minute BP sitting:   130 / 80  (left arm) Cuff size:   large  Vitals Entered By: Glendell Docker CMA (March 01, 2008 2:29 PM)                  Chief Complaint:  Cough.  History of Present Illness: c/o chronic dry cough for the past 2 weeks with intermitent throat clearing. patient states worse at night; lost 3 lbs from last visit ingtentionally with better diet and excercise; denies polys or low sugars, cbg's in low 100's, good compliance iwth meds, denies ST,wheezing, sob, doe, orthopnea, pnd or Lowdermilk edema    Updated Prior Medication List: METOPROLOL TARTRATE 50 MG TABS (METOPROLOL TARTRATE) 1/2 tab daily LISINOPRIL 40 MG TABS (LISINOPRIL) 1  tab daily LIPITOR 80 MG TABS (ATORVASTATIN CALCIUM) 1 tab daily OMEPRAZOLE 20 MG TBEC (OMEPRAZOLE) 1 tab daily OMEGA-3 FISH OIL 1200 MG CAPS (OMEGA-3 FATTY ACIDS) 1 tab daily ADULT ASPIRIN EC LOW STRENGTH 81 MG TBEC (ASPIRIN) 1po once daily AZITHROMYCIN 250 MG TABS (AZITHROMYCIN) 2po qd for 1 day, then 1po qd for 4days, then stop  Current Allergies (reviewed today): No known allergies   Past Medical History:    Reviewed history from 02/09/2008 and no changes required:       Hyperlipidemia       Hypertension       Coronary artery disease       Myocardial infarction, hx of       hx of post op mild anemia 8/08       GERD       Diabetes mellitus, type II  Past Surgical History:    Reviewed history from 02/09/2008 and no changes required:       Coronary artery bypass graft       s/p rectal abscess 2005 - dr Carolynne Edouard - with interanal sphincterotomy   Family History:    Reviewed history from 02/09/2008 and no changes required:       family in [place] - history unknown, parents  deceased  Social History:    Reviewed history from 02/09/2008 and no changes required:       Married       3 children       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, advertising        Former Smoker       Alcohol use-yes - social on the weekends       to [place] from [place] 1979    Review of Systems  The patient denies anorexia, fever, weight loss, weight gain, vision loss, decreased hearing, hoarseness, chest pain, syncope, dyspnea on exertion, peripheral edema, prolonged cough, headaches, hemoptysis, abdominal pain, melena, hematochezia, severe indigestion/heartburn, hematuria, incontinence, muscle weakness, suspicious skin lesions, transient blindness, difficulty walking, depression, unusual weight change, abnormal bleeding, enlarged lymph nodes, angioedema, and breast masses.         all otherwise negative    Physical Exam  General:     alert and well-developed., mild ill  Head:     Normocephalic and atraumatic without obvious abnormalities. No apparent alopecia or  balding. Eyes:     No corneal or conjunctival inflammation noted. EOMI. Perrla.  Ears:     bilat tm's red, sinus tender bilat Nose:     nasal dischargemucosal pallor and mucosal erythema.   Mouth:     pharyngeal erythema and fair dentition.   Neck:     supple and no masses.   Lungs:     normal respiratory effort and normal breath sounds.   Heart:     normal rate and regular rhythm.   Abdomen:     soft, non-tender, and normal bowel sounds.   Msk:     no joint tenderness and no joint swelling.   Extremities:     no edema, no ulcers  Neurologic:     cranial nerves II-XII intact and strength normal in all extremities.      Impression & Recommendations:  Problem # 1:  Preventive Health Care (ICD-V70.0) Overall doing well, up to date, counseled on routine health concerns for screening and prevention, immunizations up to date or declined, labs reviewed,also for colonoscopy  Orders: Gastroenterology Referral  (GI)   Problem # 2:  DIABETES MELLITUS, TYPE II (ICD-250.00)  His updated medication list for this problem includes:    Lisinopril 40 Mg Tabs (Lisinopril) .[name]... 1  tab daily    Adult Aspirin Ec Low Strength 81 Mg Tbec (Aspirin) .[name]... 1po once daily  Labs Reviewed: HgBA1c: 6.4 (02/09/2008)   Creat: 1.0 (02/09/2008)    stable overall by hx and exam, ok to continue meds/tx as is - curretnly ok with diet  Problem # 3:  HYPERTENSION (ICD-401.9)  His updated medication list for this problem includes:    Metoprolol Tartrate 50 Mg Tabs (Metoprolol tartrate) .[name]... 1/2 tab daily    Lisinopril 40 Mg Tabs (Lisinopril) .[name]... 1  tab daily stable overall by hx and exam, ok to continue meds/tx as is  BP today: 130/80 Prior BP: 160/90 (02/09/2008)  Labs Reviewed: Creat: 1.0 (02/09/2008) Chol: 134 (02/09/2008)   HDL: 52.7 (02/09/2008)   LDL: 55 (02/09/2008)   TG: 133 (02/09/2008)   Problem # 4:  HYPERLIPIDEMIA (ICD-272.4)  His updated medication list for this problem includes:    Lipitor 80 Mg Tabs (Atorvastatin calcium) .[name]... 1 tab daily  Labs Reviewed: Chol: 134 (02/09/2008)   HDL: 52.7 (02/09/2008)   LDL: 55 (02/09/2008)   TG: 133 (02/09/2008) SGOT: 28 (02/09/2008)   SGPT: 41 (02/09/2008) stable overall by hx and exam, ok to continue meds/tx as is    Problem # 5:  ACUTE SINUSITIS, UNSPECIFIED (ICD-461.9)  His updated medication list for this problem includes:    Azithromycin 250 Mg Tabs (Azithromycin) .[name]... 2po qd for 1 day, then 1po qd for 4days, then stop treat as above, f/u any worsening signs or symptoms   Complete Medication List: 1)  Metoprolol Tartrate 50 Mg Tabs (Metoprolol tartrate) .... 1/2 tab daily 2)  Lisinopril 40 Mg Tabs (Lisinopril) .[name].. 1  tab daily 3)  Lipitor 80 Mg Tabs (Atorvastatin calcium) .[name].. 1 tab daily 4)  Omeprazole 20 Mg Tbec (Omeprazole) .[name].. 1 tab daily 5)  Omega-3 Fish Oil 1200 Mg Caps (Omega-3 fatty acids) .[name].. 1 tab daily 6)  Adult Aspirin Ec Low  Strength 81 Mg Tbec (Aspirin) .[name].. 1po once daily 7)  Azithromycin 250 Mg Tabs (Azithromycin) .... 2po qd for 1 day, then 1po qd for 4days, then stop   Patient Instructions: 1)  Please take all new medications as prescribed 2)  Continue all medications that you  may have been taking previously 3)  You will be contacted about the referral(s) to: colonoscopy 4)  Please schedule a follow-up appointment in 6 months with : 5)  BMP prior to visit, ICD-9: 250.02 6)  Lipid Panel prior to visit, ICD-9: 7)  HbgA1C prior to visit, ICD-9:   Prescriptions: AZITHROMYCIN 250 MG TABS (AZITHROMYCIN) 2po qd for 1 day, then 1po qd for 4days, then stop  #6 x 1   Entered and Authorized by:   Corwin Levins MD   Signed by:   Corwin Levins MD on 03/01/2008   Method used:   Print then Give to Patient   RxID:   [id number]   Current Allergies (reviewed today): No known allergies

## 2010-02-10 NOTE — Assessment & Plan Note (Signed)
Summary: 6 mos f/u $50 cd   Vital Signs:  Patient profile:   55 year old male Height:      64 inches Weight:      160 pounds BMI:     27.56 O2 Sat:      98 % on Room air Temp:     98.1 degrees F oral Pulse rate:   68 / minute BP sitting:   130 / 72  (left arm)  Vitals Entered By: Lucious Groves (February 28, 2009 9:06 AM)  O2 Flow:  Room air  Preventive Care Screening  Last Flu Shot:    Date:  11/11/2008    Results:  given   CC: 6 MO. RTN OV--Pt states that he is doing somewhat better. No med refills needed at this time./kb Is Patient Diabetic? Yes Did you bring your meter with you today? No Pain Assessment Patient in pain? no        Primary Care Provider:  Corwin Levins MD  CC:  6 MO. RTN OV--Pt states that he is doing somewhat better. No med refills needed at this time./kb.  History of Present Illness: overall doing well, no complaints;  Pt denies CP, sob, doe, wheezing, orthopnea, pnd, worsening Betke edema, palps, dizziness or syncope   Pt denies new neuro symptoms such as headache, facial or extremity weakness   Problems Prior to Update: 1)  Atrial Fibrillation  (ICD-427.31) 2)  Dizziness  (ICD-780.4) 3)  Chest Pain  (ICD-786.50) 4)  Coronary Artery Disease  (ICD-414.00) 5)  Hypertension  (ICD-401.9) 6)  Hyperlipidemia  (ICD-272.4) 7)  Bradycardia  (ICD-427.89) 8)  Gerd  (ICD-530.81) 9)  Diabetes Mellitus, Type II  (ICD-250.00) 10)  Acute Sinusitis, Unspecified  (ICD-461.9) 11)  Myocardial Infarction, Hx of  (ICD-412) 12)  Preventive Health Care  (ICD-V70.0)  Medications Prior to Update: 1)  Metoprolol Tartrate 50 Mg Tabs (Metoprolol Tartrate) .... One Tablet Twice A Day 2)  Lisinopril 40 Mg Tabs (Lisinopril) .[name].. 1 P Odaily 3)  Lipitor 80 Mg Tabs (Atorvastatin Calcium) .[name].. 1 Tab Daily 4)  Omega-3 Fish Oil 1200 Mg Caps (Omega-3 Fatty Acids) .[name].. 1 Tab Daily 5)  Aspirin 325 Mg  Tabs (Aspirin) .[name].. 1 By Mouth Daily 6)  Acetaminophen 325 Mg  Tabs  (Acetaminophen) .... As Needed  Current Medications (verified): 1)  Metoprolol Tartrate 50 Mg Tabs (Metoprolol Tartrate) .... One Tablet Twice A Day 2)  Lisinopril 40 Mg Tabs (Lisinopril) .[name].. 1 P Odaily 3)  Lipitor 80 Mg Tabs (Atorvastatin Calcium) .[name].. 1 Tab Daily 4)  Omega-3 Fish Oil 1200 Mg Caps (Omega-3 Fatty Acids) .[name].. 1 Tab Daily 5)  Aspirin 325 Mg  Tabs (Aspirin) .[name].. 1 By Mouth Daily 6)  Acetaminophen 325 Mg  Tabs (Acetaminophen) .... As Needed  Allergies (verified): No Known Drug Allergies  Past History:  Past Medical History: Last updated: 12/11/2008 1. Coronary artery disease.  The patient presented in August 2008 with acute coronary syndrome.  He did have a bypass surgery.  He did have 3-vessel disease on heart catheterization and had bypass surgery with LIMA to the LAD, sequential vein graft to the obtuse marginal-1 and obtuse marginal-2, and vein graft to the RCA.  Adenosine myoview (11/10): EF 64%, normal wall motion, normal perfusion.  2. Echocardiogram, November 2008, EF 65-70%.  No regional wall motion abnormalities.  Trivial mitral regurgitation. 3. Gastroesophageal reflux disease. 4. Hypertension. 5. Hyperlipidemia. 6. History of tobacco abuse.  The patient quit smoking in 2008.  7. Sick  sinus syndrome with presyncope: Medtronic dual chamber PCM placed 11/10.  8. DIABETES MELLITUS, TYPE II (ICD-250.00) 9. hx of post op mild anemia 8/08 10.  Paroxysmal atrial fibrillation: only episode noted briefly during hospitalizatoin for PCM placement in 11/10.   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, advertising  Former Smoker Alcohol use-yes - social  on the weekends to [place] from [place] 1979  Risk Factors: Smoking Status: quit (02/09/2008)  Review of Systems  The patient denies anorexia, fever, weight loss, weight gain, vision loss, decreased hearing, hoarseness, chest pain, syncope, dyspnea on exertion, peripheral edema, prolonged cough, headaches, hemoptysis, abdominal pain, melena, hematochezia, severe indigestion/heartburn, hematuria, incontinence, muscle weakness, suspicious skin lesions, difficulty walking, depression, unusual weight change, abnormal bleeding, enlarged lymph nodes, and angioedema.         all otherwise negative per pt -   Physical Exam  General:  alert and overweight-appearing.   Head:  normocephalic and atraumatic.   Eyes:  vision grossly intact, pupils equal, and pupils round.   Ears:  R ear normal and L ear normal.   Nose:  no external deformity and no nasal discharge.   Mouth:  no gingival abnormalities and pharynx pink and moist.   Neck:  supple and no masses.   Lungs:  normal respiratory effort and normal breath sounds.   Heart:  normal rate and regular rhythm.   Abdomen:  soft, non-tender, and normal bowel sounds.   Msk:  no joint tenderness and no joint swelling.   Extremities:  no edema, no erythema  Neurologic:  cranial nerves II-XII intact and strength normal in all extremities.   Skin:  color normal and no rashes.     Impression & Recommendations:  Problem # 1:  Preventive Health Care (ICD-V70.0)  Overall doing well, age appropriate education and counseling updated and referral for appropriate preventive services done unless declined, immunizations up to date or declined, diet counseling done if overweight, urged to quit smoking if smokes , most recent labs reviewed and current ordered if appropriate, ecg reviewed or declined (interpretation per ECG scanned in the EMR if done); information regarding Medicare Prevention requirements given if appropriate   Orders: TLB-PSA (Prostate Specific  Antigen) (84153-PSA)  Complete Medication List: 1)  Metoprolol Tartrate 50 Mg Tabs (Metoprolol tartrate) .... One tablet twice a day 2)  Lisinopril 40 Mg Tabs (Lisinopril) .[name].. 1 p odaily 3)  Lipitor 80 Mg Tabs (Atorvastatin calcium) .[name].. 1 tab daily 4)  Omega-3 Fish Oil 1200 Mg Caps (Omega-3 fatty acids) .[name].. 1 tab daily 5)  Aspirin 325 Mg Tabs (Aspirin) .[name].. 1 by mouth daily 6)  Acetaminophen 325 Mg Tabs (Acetaminophen) .... As needed  Other Orders: TLB-Microalbumin/Creat Ratio, Urine (82043-MALB) TLB-A1C / Hgb A1C (Glycohemoglobin) (83036-A1C) TLB-BMP (Basic Metabolic Panel-BMET) (80048-METABOL)  Patient Instructions: 1)  Please go to the Lab in the basement for your blood  tests today 2)  Continue all previous medications as before this visit  3)  Please schedule a follow-up appointment in 6 months with: 4)  BMP prior to visit, ICD-9: 250.02 5)  Lipid Panel prior to visit, ICD-9: 6)  HbgA1C prior to visit, ICD-9: 7)  Hepatic Panel prior to visit, ICD-9: v58.69  Preventive Care Screening  Last Flu Shot:    Date:  11/11/2008    Results:  given

## 2010-02-10 NOTE — Letter (Signed)
Summary: Out of Work  [facility]  [address] [place], [place] [address]   Phone: [phone]  Fax: [phone]    October 02, 2008   Employee:  [name] [name]    To Whom It May Concern:   For Medical reasons, please excuse the above named employee from work for the following dates:  Start:    End:    If you need additional information, please feel free to contact our office.         Sincerely,    Windell Norfolk  Appended Document: Out of Work wrong note...Marland KitchenMarland Kitchenre-done second note see EMR

## 2010-02-10 NOTE — Assessment & Plan Note (Signed)
Summary: F6M/JML  Medications Added METOPROLOL TARTRATE 25 MG TABS (METOPROLOL TARTRATE) one  two times a day METOPROLOL TARTRATE 25 MG TABS (METOPROLOL TARTRATE) 1/2 two times a day METOPROLOL TARTRATE 50 MG TABS (METOPROLOL TARTRATE) one tablet twice a day LISINOPRIL 40 MG TABS (LISINOPRIL) 1 p odaily ASPIRIN 325 MG  TABS (ASPIRIN) 1 by mouth daily ACETAMINOPHEN 325 MG  TABS (ACETAMINOPHEN) as needed      Allergies Added: NKDA   Primary Provider:  Corwin Levins MD  CC:  no complaints.  History of Present Illness: 55 yo with history of CAD s/p CABG returns for cardiology followup.  Patient had episode of syncope in 11/10 and found on telemetry to have sinus bradycardia and sinus pauses (sick sinus syndrome).  Patient had a Medtronic dual chamber PCM placed.  Patient had a brief episode of atrial fibrillation in the hospital and declined coumadin.  Pacemaker check on 11/17 showed 4 mode switch episodes but all less than 1 minute and no EGMs available.  No definite recurrent atrial fibrillation.  Patient also had an adenosine myoview in 11/10 with no evidence for ischemia or infarction.    Patient is back at work.  He is feeling well, no more dizziness.  No exertional chest pain or dyspnea.    ECG: a-paced, v-sensed with inferior T wave inversions  Labs (8/10): creatinine 1.1, LDL 61, HDL 54  Current Medications (verified): 1)  Metoprolol Tartrate 50 Mg Tabs (Metoprolol Tartrate) .... One Tablet Twice A Day 2)  Lisinopril 40 Mg Tabs (Lisinopril) .[name].. 1 P Odaily 3)  Lipitor 80 Mg Tabs (Atorvastatin Calcium) .[name].. 1 Tab Daily 4)  Omega-3 Fish Oil 1200 Mg Caps (Omega-3 Fatty Acids) .[name].. 1 Tab Daily 5)  Aspirin 325 Mg  Tabs (Aspirin) .[name].. 1 By Mouth Daily 6)  Acetaminophen 325 Mg  Tabs (Acetaminophen) .... As Needed  Allergies (verified): No Known Drug Allergies  Past History:  Past Medical History: 1. Coronary artery disease.  The patient presented in August 2008 with acute  coronary syndrome.  He did have a bypass surgery.  He did have 3-vessel disease on heart catheterization and had bypass surgery with LIMA to the LAD, sequential vein graft to the obtuse marginal-1 and obtuse marginal-2, and vein graft to the RCA.  Adenosine myoview (11/10): EF 64%, normal wall motion, normal perfusion.  2. Echocardiogram, November 2008, EF 65-70%.  No regional wall motion abnormalities.  Trivial mitral regurgitation. 3. Gastroesophageal reflux disease. 4. Hypertension. 5. Hyperlipidemia. 6. History of tobacco abuse.  The patient quit smoking in 2008.  7. Sick sinus syndrome with presyncope: Medtronic dual chamber PCM placed 11/10.  8. DIABETES MELLITUS, TYPE II (ICD-250.00) 9. hx of post op mild anemia 8/08 10.  Paroxysmal atrial fibrillation: only episode noted briefly during hospitalizatoin for PCM placement in 11/10.   Family History: Reviewed history from 02/09/2008 and no changes required. family in [place] - history unknown, parents deceased  Social History: Reviewed history from 02/09/2008 and no changes required. 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   Vital Signs:  Patient profile:   55 year old male Height:      64 inches Weight:      156 pounds BMI:     26.87 Pulse rate:   73 / minute  Resp:     16 per minute BP sitting:   144 / 84  (left arm)  Vitals Entered By: Marrion Coy, CNA (December 11, 2008 8:23 AM)  Physical Exam  General:  Well developed, well nourished, in no acute distress. Neck:  Neck supple, no JVD. No masses, thyromegaly or abnormal cervical nodes. Lungs:  Clear bilaterally to auscultation and percussion. Heart:  Non-displaced PMI, chest non-tender; regular rate and rhythm, S1, S2 without murmurs, rubs or gallops. Carotid upstroke normal, no bruit.   Pedals normal pulses. No edema, no varicosities. Abdomen:  Bowel sounds positive; abdomen soft and non-tender without masses, organomegaly, or hernias noted. No hepatosplenomegaly. Extremities:  No clubbing or cyanosis. Neurologic:  Alert and oriented x 3. Psych:  Normal affect.   PPM Specifications Following MD:  Hillis Range, MD     PPM Vendor:  Medtronic     PPM Model Number:  [id number]     PPM Serial Number:  [id number] PPM DOI:  11/11/2008     PPM Implanting MD:  Hillis Range, MD  Lead 1    Location: RA     DOI: 11/11/2008     Model #: [id number]     Serial #: [id number]     Status: active Lead 2    Location: RV     DOI: 11/11/2008     Model #: [id number]     Serial #: [id number]     Status: active  Magnet Response Rate:  BOL 85 ERI  65  Indications:  Sick sinus syndrome   PPM Follow Up Pacer Dependent:  Yes      Episodes Coumadin:  No  Parameters Mode:  MVP (R)     Lower Rate Limit:  60     Upper Rate Limit:  130 Paced AV Delay:  150     Sensed AV Delay:  120  Impression & Recommendations:  Problem # 1:  CORONARY ARTERY DISEASE (ICD-414.00) No ischemia/infarction on myoview 11/10.  No chest pain.  Continue ASA, beta blocker, Lipitor, ACEI.   Problem # 2:  BRADYCARDIA (ICD-427.89) Status post Medtronic dual chamber PCM for sick sinus syndrome.  Minimal V-pacing.    Problem # 3:  HYPERTENSION (ICD-401.9) BP high today, increase lopressor from 37.5 mg two times a day to 50 mg two times a day.  I doubt that this will increase V-pacing as his AV nodal conduction seems normal.  He is a-pacing.   Problem # 4:  HYPERLIPIDEMIA (ICD-272.4) Check lipids/LFTs, goal LDL < 70.   Problem # 5:  ATRIAL FIBRILLATION (ICD-427.31) No definite atrial fibrillation on PCM interrogation 11/17.  Will continue to monitor.  If he has significant afib, he should be on coumadin.   Other Orders: EKG w/ Interpretation (93000) TLB-Lipid Panel (80061-LIPID) TLB-Hepatic/Liver Function Pnl  (80076-HEPATIC)  Patient Instructions: 1)  Your physician has recommended you make the following change in your medication:  2)  Increase Metoprolol to 50 two times a day--you can take two 25mg  tablets twice a day--there is a new prescription for a 50mg  tablet at  [facility] 3)  Your physician recommends that you return have a lipid profile/liver profile today  414.05 v58.69  4)  Your physician recommends that you schedule a follow-up appointment in: 6 months with Dr. Marca Ancona --you should get a letter in the mail 2 months before the appointment time-if you do not get a letter call our office to schedule an appointment Prescriptions: METOPROLOL TARTRATE 50 MG TABS (METOPROLOL TARTRATE) one  tablet twice a day  #60 x 6   Entered by:   Katina Dung, RN, BSN   Authorized by:   Marca Ancona, MD   Signed by:   Katina Dung, RN, BSN on 12/11/2008   Method used:   Electronically to        [facility]. #[id number]* (retail)       [address] [place], [place]  [address]       Ph: [phone]       Fax: [phone]   RxID:   [id number] METOPROLOL TARTRATE 25 MG TABS (METOPROLOL TARTRATE) one  two times a day  #60 x 6   Entered by:   Katina Dung, RN, BSN   Authorized by:   Marca Ancona, MD   Signed by:   Katina Dung, RN, BSN on 12/11/2008   Method used:   Electronically to        [facility]. #[id number]* (retail)       [address] [place], [place]  [address]       Ph: [phone]       Fax: [phone]   RxID:   [id number]

## 2010-02-10 NOTE — Progress Notes (Signed)
Summary: Dizzy/passed out  Phone Note Call from Patient Call back at [phone]   Caller: Daughter Call For: [facility] Summary of Call: Patient's daughter called stating that pt passed out at work, he is now complaining of dizziness off and on. This happened around 9:30am and the daugther now states he's not dizzy at the moment. Please advise.  Initial call taken by: Mervin Hack CMA (AAMA),  November 09, 2008 11:00 AM  Follow-up for Phone Call        Send to ER  Follow-up by: Loreen Freud DO,  November 09, 2008 11:37 AM

## 2010-02-10 NOTE — Assessment & Plan Note (Signed)
Summary: pc2/medtronic      Allergies Added: NKDA  Visit Type:  Follow-up Primary Provider:  Corwin Levins MD   History of Present Illness: The patient presents today for routine electrophysiology followup. He reports doing very well since his pacemaker was implanted.  He denies further dizziness or presyncope. The patient denies symptoms of palpitations, chest pain, shortness of breath, orthopnea, PND, lower extremity edema, syncope, or neurologic sequela. The patient is tolerating medications without difficulties and is otherwise without complaint today.   Allergies (verified): No Known Drug Allergies  Past History:  Past Medical History: Reviewed history from 12/11/2008 and no changes required. 1. Coronary artery disease.  The patient presented in August 2008 with acute coronary syndrome.  He did have a bypass surgery.  He did have 3-vessel disease on heart catheterization and had bypass surgery with LIMA to the LAD, sequential vein graft to the obtuse marginal-1 and obtuse marginal-2, and vein graft to the RCA.  Adenosine myoview (11/10): EF 64%, normal wall motion, normal perfusion.  2. Echocardiogram, November 2008, EF 65-70%.  No regional wall motion abnormalities.  Trivial mitral regurgitation. 3. Gastroesophageal reflux disease. 4. Hypertension. 5. Hyperlipidemia. 6. History of tobacco abuse.  The patient quit smoking in 2008.  7. Sick sinus syndrome with presyncope: Medtronic dual chamber PCM placed 11/10.  8. DIABETES MELLITUS, TYPE II (ICD-250.00) 9. hx of post op mild anemia 8/08 10.  Paroxysmal atrial fibrillation: only episode noted briefly during hospitalizatoin for PCM placement in 11/10.   Past Surgical History: Coronary artery bypass graft s/p rectal abscess 2005 - dr Carolynne Edouard - with interanal sphincterotomy Pacemaker implantation  Social History: Reviewed history from 02/09/2008 and no changes required. Married 3 children work - Freight forwarder - Science writer  Former Smoker Alcohol use-yes - social on the weekends to [place] from [place] 1979  Vital Signs:  Patient profile:   55 year old male Height:      64 inches Weight:      160 pounds BMI:     27.56 Pulse rate:   60 / minute BP sitting:   130 / 80  (left arm)  Vitals Entered By: Laurance Flatten CMA (February 14, 2009 9:31 AM)  Physical Exam  General:  Well developed, well nourished, in no acute distress. Head:  normocephalic and atraumatic.   Eyes:  vision grossly intact, pupils equal, and pupils round.   Nose:  no external deformity and no nasal discharge.   Mouth:  no gingival abnormalities and pharynx pink and moist.   Neck:  Neck supple, no JVD. No masses, thyromegaly or abnormal cervical nodes. Chest Wall:  pacemaker site is well healed Lungs:  Clear bilaterally to auscultation and percussion. Heart:  Non-displaced PMI, chest non-tender; regular rate and rhythm, S1, S2 without murmurs, rubs or gallops. Carotid upstroke normal, no bruit. Normal abdominal aortic size, no bruits. Femorals normal pulses, no bruits. Pedals normal pulses. No edema, no varicosities. Abdomen:  Bowel sounds positive; abdomen soft and non-tender without masses, organomegaly, or hernias noted. No hepatosplenomegaly. Msk:  Back normal, normal gait. Muscle strength and tone normal. Pulses:  pulses normal in all 4 extremities Extremities:  No clubbing or cyanosis. Neurologic:  Alert and oriented x 3. Skin:  Intact without lesions or rashes. Cervical Nodes:  no significant adenopathy Psych:  Normal affect.   PPM Specifications Following MD:  Hillis Range, MD     PPM Vendor:  Medtronic     PPM Model Number:  ADDRL1  PPM Serial Number:  [id number] PPM DOI:  11/11/2008     PPM Implanting MD:  Hillis Range, MD  Lead 1    Location: RA     DOI: 11/11/2008     Model #: [id number]     Serial #: [id number]     Status: active Lead 2    Location: RV     DOI: 11/11/2008     Model #: [id number]     Serial #: [id number]      Status: active  Magnet Response Rate:  BOL 85 ERI  65  Indications:  Sick sinus syndrome   PPM Follow Up Remote Check?  No Battery Voltage:  2.8 V     Battery Est. Longevity:  10 years     Pacer Dependent:  Yes       PPM Device Measurements Atrium  Amplitude: 2.0 mV, Impedance: 584 ohms, Threshold: 0.625 V at 0.4 msec Right Ventricle  Amplitude: 15.68 mV, Impedance: 538 ohms, Threshold: 0.625 V at 0.4 msec  Episodes MS Episodes:  0     Percent Mode Switch:  0     Coumadin:  No Ventricular High Rate:  0     Atrial Pacing:  87%     Ventricular Pacing:  0.4%  Parameters Mode:  MVP (R)     Lower Rate Limit:  60     Upper Rate Limit:  130 Paced AV Delay:  150     Sensed AV Delay:  120 Next Cardiology Appt Due:  11/11/2009 Tech Comments:  No parameter changes.  Device function normal.   ROV 11/11 with Dr. Johney Frame. Altha Harm, LPN  February 14, 2009 9:49 AM  MD Comments:  agree  Impression & Recommendations:  Problem # 1:  BRADYCARDIA (ICD-427.89) resolved with pacing. doing well pacemaker reprogrammed as above  His updated medication list for this problem includes:    Metoprolol Tartrate 50 Mg Tabs (Metoprolol tartrate) ..... One tablet twice a day    Lisinopril 40 Mg Tabs (Lisinopril) .[name]... 1 p odaily    Aspirin 325 Mg Tabs (Aspirin) .[name]... 1 by mouth daily  Problem # 2:  HYPERTENSION (ICD-401.9) stable, no changes  Patient Instructions: 1)  return 11/11

## 2010-02-10 NOTE — Letter (Signed)
Summary: Colonoscopy Letter  [facility]  [address] [place], [place] [address]   Phone: [phone]  Fax: [phone]      March 26, 2009 MRN: [id number]   [name] [name] [address] [place], [place]  [address]   Dear [name],   According to your medical record, it is time for you to schedule a Colonoscopy. The American Cancer Society recommends this procedure as a method to detect early colon cancer. Patients with a family history of colon cancer, or a personal history of colon polyps or inflammatory bowel disease are at increased risk.  This letter has beeen generated based on the recommendations made at the time of your procedure. If you feel that in your particular situation this may no longer apply, please contact our office.  Please call our office at [phone] to schedule this appointment or to update your records at your earliest convenience.  Thank you for cooperating with [place] to provide you with the very best care possible.   Sincerely,  Rachael Fee, M.D.  [facility] [phone]

## 2010-02-10 NOTE — Assessment & Plan Note (Signed)
Summary: 6 mos f/u // # cd   Vital Signs:  Patient profile:   55 year old male Height:      64 inches Weight:      160.13 pounds BMI:     27.59 O2 Sat:      99 % on Room air Temp:     97.3 degrees F oral Pulse rate:   77 / minute BP sitting:   120 / 88  (left arm) Cuff size:   regular  Vitals Entered By: Zella Ball Ewing CMA Duncan Dull) (September 05, 2009 8:57 AM)  O2 Flow:  Room air  CC: 6 month ROV/RE   Primary Care Provider:  Corwin Levins MD  CC:  6 month ROV/RE.  History of Present Illness: no wt change from feb;  here to f/u - c/o reflux symptoms 3 to 4 times per day with non prod cough, sometimes more often than that, but  no dysphagia, no wt loss, no abd pain, no bowel change and no blood.  Pt denies other CP, worsening sob, doe, wheezing, orthopnea, pnd, worsening Dahm edema, palps, dizziness or syncope  Pt denies new neuro symptoms such as headache, facial or extremity weakness  No fever, wt loss, night sweats, loss of appetite or other constitutional symptoms  Pt denies polydipsia, polyuria, or low sugar symptoms such as shakiness improved with eating.  Overall good compliance with meds, trying to follow low chol, DM diet, wt stable, little excercise however Does have a recurrent red area inside the left nares quite painful but not ulcerative , over the past few weeks, like a small boil that keeps coming back.  BP does not seem to be as well controleld in the am than in the afternoon, with ave BP in the am about 150  Problems Prior to Update: 1)  Atrial Fibrillation  (ICD-427.31) 2)  Dizziness  (ICD-780.4) 3)  Chest Pain  (ICD-786.50) 4)  Coronary Artery Disease  (ICD-414.00) 5)  Hypertension  (ICD-401.9) 6)  Hyperlipidemia  (ICD-272.4) 7)  Bradycardia  (ICD-427.89) 8)  Gerd  (ICD-530.81) 9)  Diabetes Mellitus, Type II  (ICD-250.00) 10)  Acute Sinusitis, Unspecified  (ICD-461.9) 11)  Myocardial Infarction, Hx of  (ICD-412) 12)  Preventive Health Care  (ICD-V70.0)  Medications  Prior to Update: 1)  Metoprolol Tartrate 50 Mg Tabs (Metoprolol Tartrate) .... One Tablet Twice A Day 2)  Lisinopril 40 Mg Tabs (Lisinopril) .[name].. 1 P Odaily 3)  Lipitor 80 Mg Tabs (Atorvastatin Calcium) .[name].. 1 Tab Daily 4)  Omega-3 Fish Oil 1200 Mg Caps (Omega-3 Fatty Acids) .[name].. 1 Tab Twice Daily 5)  Aspirin 325 Mg  Tabs (Aspirin) .[name].. 1 By Mouth Daily 6)  Acetaminophen 325 Mg  Tabs (Acetaminophen) .... As Needed  Current Medications (verified): 1)  Metoprolol Succinate 100 Mg Xr24h-Tab (Metoprolol Succinate) .[name].. 1po Once Daily 2)  Lisinopril 40 Mg Tabs (Lisinopril) .[name].. 1 P Odaily 3)  Lipitor 80 Mg Tabs (Atorvastatin Calcium) .[name].. 1 Tab Daily 4)  Omega-3 Fish Oil 1200 Mg Caps (Omega-3 Fatty Acids) .[name].. 1 Tab Twice Daily 5)  Aspirin 325 Mg  Tabs (Aspirin) .[name].. 1 By Mouth Daily 6)  Acetaminophen 325 Mg  Tabs (Acetaminophen) .... As Needed 7)  Omeprazole 20 Mg Cpdr (Omeprazole) .... 2 By Mouth Once Daily 8)  Mupirocin 2 % Oint (Mupirocin) .... Use Asd Three Times A Day For 7 Days  Allergies (verified): No Known Drug Allergies  Past History:  Past Medical History: Last updated: 12/11/2008 1. Coronary artery disease.  The patient presented in August 2008 with acute coronary syndrome.  He did have a bypass surgery.  He did have 3-vessel disease on heart catheterization and had bypass surgery with LIMA to the LAD, sequential vein graft to the obtuse marginal-1 and obtuse marginal-2, and vein graft to the RCA.  Adenosine myoview (11/10): EF 64%, normal wall motion, normal perfusion.  2. Echocardiogram, November 2008, EF 65-70%.  No regional wall motion abnormalities.  Trivial mitral regurgitation. 3. Gastroesophageal reflux disease. 4. Hypertension. 5. Hyperlipidemia. 6. History of tobacco abuse.  The patient quit smoking in 2008.  7. Sick sinus syndrome with presyncope: Medtronic dual chamber PCM placed 11/10.  8. DIABETES MELLITUS, TYPE II (ICD-250.00) 9. hx of post op mild anemia  8/08 10.  Paroxysmal atrial fibrillation: only episode noted briefly during hospitalizatoin for PCM placement in 11/10.   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   Head:  normocephalic and atraumatic.   Eyes:  vision grossly intact, pupils equal, and pupils round.   Ears:  R ear normal and L ear normal.   Nose:  no external deformity and no nasal discharge.   Mouth:  no gingival abnormalities and pharynx pink and moist.   Neck:  supple and no masses.   Lungs:  normal respiratory effort.   Heart:  normal rate and regular rhythm.   Extremities:  no edema, no erythema    Impression & Recommendations:  Problem # 1:  HYPERTENSION (ICD-401.9)  His updated medication list for this problem includes:    Metoprolol Succinate 100 Mg Xr24h-tab (Metoprolol succinate) .[name]... 1po once daily    Lisinopril 40 Mg Tabs (Lisinopril) .[name]... 1 p odaily  BP today: 120/88 Prior BP: 154/82 (06/27/2009)  Labs Reviewed: K+: 4.5 (02/28/2009) Creat: : 0.9 (02/28/2009)   Chol: 156 (06/27/2009)   HDL: 61.30 (06/27/2009)   LDL: 62 (12/11/2008)   TG: 214.0 (06/27/2009) AM BP 's elev per pt in the 150's -- will try to change to once daily metoprolol to see if this improves his overall control  Problem # 2:  HYPERLIPIDEMIA (ICD-272.4)  His updated medication list for this problem includes:    Lipitor 80 Mg Tabs (Atorvastatin calcium) .[name]... 1 tab daily  Labs Reviewed: SGOT: 25 (06/27/2009)   SGPT: 32 (06/27/2009)   HDL:61.30  (06/27/2009), 46.10 (12/11/2008)  LDL:62 (12/11/2008), 61 (08/23/2008)  Chol:156 (06/27/2009), 137 (12/11/2008)  Trig:214.0 (06/27/2009), 143.0 (12/11/2008) stable overall by hx and exam, ok to continue meds/tx as is   Problem # 3:  DIABETES MELLITUS, TYPE II (ICD-250.00)  His updated medication list for this problem includes:    Lisinopril 40 Mg Tabs (Lisinopril) .[name]... 1 p odaily    Aspirin 325 Mg Tabs (Aspirin) .[name]... 1 by mouth daily  Labs Reviewed: Creat: 0.9 (02/28/2009)    Reviewed HgBA1c results: 6.5 (02/28/2009)  6.1 (08/23/2008) stable overall by hx and exam, ok to continue meds/tx as is - does not require OHA at this time, Pt to cont DM diet, excercise, wt loss efforts  Problem #  4:  GERD (ICD-530.81)  to start PPI, f/u any cough or persistent symptoms as needed   His updated medication list for this problem includes:    Omeprazole 20 Mg Cpdr (Omeprazole) .[name]... 2 by mouth once daily  Problem # 5:  OTHER DISEASES OF NASAL CAVITY AND SINUSES (ICD-478.19) for empiric mupirocin asd   Complete Medication List: 1)  Metoprolol Succinate 100 Mg Xr24h-tab (Metoprolol succinate) .[name].. 1po once daily 2)  Lisinopril 40 Mg Tabs (Lisinopril) .[name].. 1 p odaily 3)  Lipitor 80 Mg Tabs (Atorvastatin calcium) .[name].. 1 tab daily 4)  Omega-3 Fish Oil 1200 Mg Caps (Omega-3 fatty acids) .[name].. 1 tab twice daily 5)  Aspirin 325 Mg Tabs (Aspirin) .[name].. 1 by mouth daily 6)  Acetaminophen 325 Mg Tabs (Acetaminophen) .... As needed 7)  Omeprazole 20 Mg Cpdr (Omeprazole) .... 2 by mouth once daily 8)  Mupirocin 2 % Oint (Mupirocin) .... Use asd three times a day for 7 days  Patient Instructions: 1)  stop the metoprolol 50 mg twice per day 2)  start the metoprolol 100 mg  (ER) 1 per day in the AM 3)  Please take all new medications as prescribed  - the antibiotic for the nose, and the omeprazole 20 mg (2 per day) 4)  Continue all previous medications as before this visit 5)  Please schedule a follow-up  appointment in 6 months with CPX labs and: 6)  HbgA1C prior to visit, ICD-9: 250.02 Prescriptions: MUPIROCIN 2 % OINT (MUPIROCIN) use asd three times a day for 7 days  #7 days x 0   Entered and Authorized by:   Corwin Levins MD   Signed by:   Corwin Levins MD on 09/05/2009   Method used:   Electronically to        [facility] [id number]* (retail)       [address] [place], [place]  [address]       Ph: [phone]       Fax: [phone]   RxID:   [id number] METOPROLOL SUCCINATE 100 MG XR24H-TAB (METOPROLOL SUCCINATE) 1po once daily  #90 x 3   Entered and Authorized by:   Corwin Levins MD   Signed by:   Corwin Levins MD on 09/05/2009   Method used:   Electronically to        [facility] #[id number]* (retail)       [address] [place], [place]  [address]       Ph: [phone]       Fax: [phone]   RxID:   [id number] OMEPRAZOLE 20 MG CPDR (OMEPRAZOLE) 2 by mouth once daily  #180 x 3   Entered and Authorized by:   Corwin Levins MD   Signed by:   Corwin Levins MD on 09/05/2009   Method used:   Electronically to        [facility] [id number]* (retail)       [address] [place], [place]  [address]       Ph: [phone]       Fax: [phone]   RxID:   [id number]

## 2010-02-10 NOTE — Assessment & Plan Note (Signed)
Summary: Cardiology Nuclear Study  Nuclear Med Background Indications for Stress Test: Evaluation for Ischemia, Graft Patency, Post Hospital  Indications Comments: 11/11/08 Syncope>11/12/08 PTVP with post op afib w/RVR  History: CABG, Echo, Heart Catheterization, Myocardial Infarction, Myocardial Perfusion Study, Pacemaker  History Comments: '08 NSTEMI>CABG x 4; '09 MPS:no ischemia, EF=69%; 11/11/08 Echo: EF=60-65%;11/12/08 PTVP  Symptoms: Dizziness, Fatigue, Fatigue with Exertion, Light-Headedness, Near Syncope, Syncope  Symptoms Comments: Sx's prior to PTVP   Nuclear Pre-Procedure Cardiac Risk Factors: Family History - CAD, Hypertension, Lipids, NIDDM, Smoker Caffeine/Decaff Intake: None NPO After: 7:30 PM Lungs: Clear IV 0.9% NS with Angio Cath: 22g     IV Site: (R) AC IV Started by: Irean Hong RN Chest Size (in) 40     Height (in): 64 Weight (lb): 151 BMI: 26.01  Nuclear Med Study 1 or 2 day study:  1 day     Stress Test Type:  Adenosine Reading MD:  Marca Ancona, MD     Referring MD:  Hillis Range, MD Resting Radionuclide:  Technetium 3m Tetrofosmin     Resting Radionuclide Dose:  11 mCi  Stress Radionuclide:  Technetium 20m Tetrofosmin     Stress Radionuclide Dose:  33 mCi   Stress Protocol  Dose of Adenosine:  38.4 mg    Stress Test Technologist:  Rea College CMA-N     Nuclear Technologist:  Domenic Polite CNMT  Rest Procedure  Myocardial perfusion imaging was performed at rest 45 minutes following the intravenous administration of Myoview Technetium 71m Tetrofosmin.  Stress Procedure  The patient received IV adenosine at 140 mcg/kg/min for 4 minutes. There were no significant changes with infusion. Myoview was injected at the 2 minute mark and quantitative spect images were obtained after a 45 minute delay.  QPS Raw Data Images:  Normal; no motion artifact; normal heart/lung ratio. Stress Images:  NI: Uniform and normal uptake of tracer in all myocardial  segments. Rest Images:  Normal homogeneous uptake in all areas of the myocardium. Subtraction (SDS):  There is no evidence of scar or ischemia. Transient Ischemic Dilatation:  1.04  (Normal <1.22)  Lung/Heart Ratio:  .23  (Normal <0.45)  Quantitative Gated Spect Images QGS EDV:  86 ml QGS ESV:  31 ml QGS EF:  64 % QGS cine images:  Normal wall motion.    Overall Impression  Exercise Capacity: Adenosine study with no exercise. BP Response: Normal blood pressure response. Clinical Symptoms: Stomach pressure ECG Impression: No significant ST segment change suggestive of ischemia. Overall Impression: Normal stress nuclear study.  Appended Document: Cardiology Nuclear Study please inform pt of his normal nuclear study  Appended Document: Cardiology Nuclear Study LMOM w nl results

## 2010-02-10 NOTE — Assessment & Plan Note (Signed)
Summary: NEW -PER SHARONDA--$50--PKG-[employer]-STC   Vital Signs:  Patient Profile:   55 Years Old Male Weight:      159.25 pounds Temp:     98.4 degrees F oral Pulse rate:   48 / minute BP sitting:   160 / 90  (right arm) Cuff size:   regular  Vitals Entered By: Windell Norfolk (February 09, 2008 8:26 AM)                 Preventive Care Screening  Last Pneumovax:    Date:  08/12/2006    Next Due:  08/2011    Results:  given   Last Tetanus Booster:    Date:  01/11/2001    Next Due:  01/2011    Results:  given      decline h1n1 shot   Chief Complaint:  new pt .  History of Present Illness: overall doing ok, here to establish, cont meds - needs refills, BP at work usually < 140/90; no orthostatic symptoms, no CP, sob, doe, orthopnea, pnd or Lasater edema    Updated Prior Medication List: METOPROLOL TARTRATE 50 MG TABS (METOPROLOL TARTRATE) 1/2 tab daily LISINOPRIL 40 MG TABS (LISINOPRIL) 1  tab daily LIPITOR 80 MG TABS (ATORVASTATIN CALCIUM) 1 tab daily OMEPRAZOLE 20 MG TBEC (OMEPRAZOLE) 1 tab daily OMEGA-3 FISH OIL 1200 MG CAPS (OMEGA-3 FATTY ACIDS) 1 tab daily ADULT ASPIRIN EC LOW STRENGTH 81 MG TBEC (ASPIRIN) 1po once daily  Current Allergies (reviewed today): No known allergies   Past Medical History:    Reviewed history and no changes required:       Hyperlipidemia       Hypertension       Coronary artery disease       Myocardial infarction, hx of       hx of post op mild anemia 8/08       GERD  Past Surgical History:    Reviewed history and no changes required:       Coronary artery bypass graft       s/p rectal abscess 2005 - dr toth - with interanal sphincterotomy   Family History:    Reviewed history and no changes required:       family in [place] - history unknown, parents deceased  Social History:    Reviewed history and no changes required:       Married       3 children       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, advertising        Former Smoker     Alcohol use-yes - social on the weekends       to [place] from [place] 1979   Risk Factors:  Tobacco use:  quit Alcohol use:  yes   Review of Systems  The patient denies anorexia, fever, weight loss, weight gain, vision loss, decreased hearing, hoarseness, chest pain, syncope, dyspnea on exertion, peripheral edema, prolonged cough, headaches, hemoptysis, abdominal pain, melena, hematochezia, severe indigestion/heartburn, hematuria, incontinence, muscle weakness, suspicious skin lesions, transient blindness, difficulty walking, depression, unusual weight change, abnormal bleeding, enlarged lymph nodes, angioedema, and breast masses.         all otherwise negative    Physical Exam  General:     alert and overweight-appearing.   Head:     Normocephalic and atraumatic without obvious abnormalities. No apparent alopecia or balding. Eyes:     No corneal or conjunctival inflammation noted. EOMI. Perrla. Funduscopic exam benign, without hemorrhages, exudates  or papilledema. Vision grossly normal. Ears:     External ear exam shows no significant lesions or deformities.  Otoscopic examination reveals clear canals, tympanic membranes are intact bilaterally without bulging, retraction, inflammation or discharge. Hearing is grossly normal bilaterally. Nose:     External nasal examination shows no deformity or inflammation. Nasal mucosa are pink and moist without lesions or exudates. Mouth:     Oral mucosa and oropharynx without lesions or exudates.  Teeth in good repair. Neck:     No deformities, masses, or tenderness noted. Lungs:     Normal respiratory effort, chest expands symmetrically. Lungs are clear to auscultation, no crackles or wheezes. Heart:     Normal rate and regular rhythm. S1 and S2 normal without gallop, murmur, click, rub or other extra sounds. Abdomen:     soft, non-tender, and normal bowel sounds.   Msk:     no joint tenderness and no joint swelling.   Extremities:      no edema, no ulcers  Neurologic:     cranial nerves II-XII intact and strength normal in all extremities.      Impression & Recommendations:  Problem # 1:  Preventive Health Care (ICD-V70.0) Overall doing well, up to date, counseled on routine health concerns for screening and prevention, immunizations up to date or declined, labs ordered, due for colonoscopy   Orders: TLB-BMP (Basic Metabolic Panel-BMET) (80048-METABOL) TLB-CBC Platelet - w/Differential (85025-CBCD) TLB-Hepatic/Liver Function Pnl (80076-HEPATIC) TLB-Lipid Panel (80061-LIPID) TLB-PSA (Prostate Specific Antigen) (84153-PSA) TLB-TSH (Thyroid Stimulating Hormone) (84443-TSH) TLB-Udip ONLY (81003-UDIP)   Problem # 2:  GLUCOSE INTOLERANCE (ICD-271.3)  Orders: TLB-A1C / Hgb A1C (Glycohemoglobin) (83036-A1C) to check labs - asymtp  Problem # 3:  HYPERTENSION (ICD-401.9)  His updated medication list for this problem includes:    Metoprolol Tartrate 50 Mg Tabs (Metoprolol tartrate) .[name]... 1/2 tab daily    Lisinopril 40 Mg Tabs (Lisinopril) .[name]... 1  tab daily  BP today: 160/90  Labs Reviewed: Creat: 1.0 (02/02/2008) Chol: 138 (12/15/2007)   HDL: 42.2 (12/15/2007)   LDL: 62 (12/15/2007)   TG: 168 (12/15/2007) to cont same meds for now - suspect some white coat HTN, instructed to check regularly at work per nurse at work  Problem # 4:  HYPERLIPIDEMIA (ICD-272.4)  His updated medication list for this problem includes:    Lipitor 80 Mg Tabs (Atorvastatin calcium) .[name]... 1 tab daily  Labs Reviewed: Chol: 138 (12/15/2007)   HDL: 42.2 (12/15/2007)   LDL: 62 (12/15/2007)   TG: 168 (12/15/2007) SGOT: 19 (12/15/2007)   SGPT: 27 (12/15/2007) to check lpids today - goal ldl less than 70  Problem # 5:  BRADYCARDIA (ICD-427.89)  His updated medication list for this problem includes:    Metoprolol Tartrate 50 Mg Tabs (Metoprolol tartrate) .[name]... 1/2 tab daily    Adult Aspirin Ec Low Strength 81 Mg Tbec (Aspirin) .[name]... 1po  once daily asympt - cont meds for now, to report any orthostasis or weakness  Complete Medication List: 1)  Metoprolol Tartrate 50 Mg Tabs (Metoprolol tartrate) .... 1/2 tab daily 2)  Lisinopril 40 Mg Tabs (Lisinopril) .[name].. 1  tab daily 3)  Lipitor 80 Mg Tabs (Atorvastatin calcium) .[name].. 1 tab daily 4)  Omeprazole 20 Mg Tbec (Omeprazole) .[name].. 1 tab daily 5)  Omega-3 Fish Oil 1200 Mg Caps (Omega-3 fatty acids) .[name].. 1 tab daily 6)  Adult Aspirin Ec Low Strength 81 Mg Tbec (Aspirin) .[name].. 1po once daily   Patient Instructions: 1)  Continue all medications that you  may have been taking previously  2)  Please go to the Lab in the basement for your blood tests today 3)  You will be contacted about the referral(s) to: colonoscopy 4)  Please schedule a follow-up appointment in 1 month. 5)  Check your Blood Pressure regularly. If it is above 140/90: you should make an appointment.

## 2010-02-10 NOTE — Cardiovascular Report (Signed)
Summary: Office Visit   Office Visit   Imported By: Roderic Ovens 12/19/2009 15:15:01  _____________________________________________________________________  External Attachment:    Type:   Image     Comment:   External Document

## 2010-02-10 NOTE — Assessment & Plan Note (Signed)
Summary: DIZZY--X 2 DYS--#---STC   Vital Signs:  Patient profile:   55 year old male Height:      63 inches Weight:      158.25 pounds BMI:     28.13 O2 Sat:      97 % Temp:     97.5 degrees F oral Pulse rate:   42 / minute BP supine:   100 / 48 BP sitting:   98 / 50  (right arm) BP standing:   102 / 48 Cuff size:   regular  Vitals Entered By: Windell Norfolk (October 02, 2008 2:12 PM) CC: dizzy x 2 days   CC:  dizzy x 2 days.  History of Present Illness: here with recurrent "flashes" of lightheadedness off an on , mild without significant decrease in function overall, and certainly no falls,  Pt denies CP, sob, doe, wheezing, orthopnea, pnd, worsening Steve edema, palps, dizziness or syncope   Pt denies new neuro symptoms such as headache, facial or extremity weakness   No fever, pain or other new symtpoms as well. Good compliacne overall wtih meds.  Works in [employer] but tries to drink plenty of fluids.  No recent signfiicant wt loss. Pt denies polydipsia, polyuria, or low sugar symptoms such as shakiness improved with eating.  Overall good compliance with meds, trying to follow low chol, DM diet, wt stable, little excercise however   Problems Prior to Update: 1)  Chest Pain  (ICD-786.50) 2)  Coronary Artery Disease  (ICD-414.00) 3)  Hypertension  (ICD-401.9) 4)  Hyperlipidemia  (ICD-272.4) 5)  Bradycardia  (ICD-427.89) 6)  Gerd  (ICD-530.81) 7)  Diabetes Mellitus, Type II  (ICD-250.00) 8)  Acute Sinusitis, Unspecified  (ICD-461.9) 9)  Myocardial Infarction, Hx of  (ICD-412) 10)  Preventive Health Care  (ICD-V70.0)  Medications Prior to Update: 1)  Metoprolol Tartrate 50 Mg Tabs (Metoprolol Tartrate) .... 1/2 Tab Daily 2)  Lisinopril 40 Mg Tabs (Lisinopril) .[name].. 1  Tab Daily 3)  Lipitor 80 Mg Tabs (Atorvastatin Calcium) .[name].. 1 Tab Daily 4)  Omeprazole 20 Mg Tbec (Omeprazole) .[name].. 1 Tab Daily 5)  Omega-3 Fish Oil 1200 Mg Caps (Omega-3 Fatty Acids) .[name].. 1 Tab  Daily 6)  Adult Aspirin Ec Low Strength 81 Mg Tbec (Aspirin) .[name].. 1po Once Daily 7)  Azithromycin 250 Mg Tabs (Azithromycin) .... 2po Qd For 1 Day, Then 1po Qd For 4days, Then Stop  Current Medications (verified): 1)  Metoprolol Tartrate 50 Mg Tabs (Metoprolol Tartrate) .... 1/2 Tab Daily 2)  Lisinopril 20 Mg Tabs (Lisinopril) .[name].. 1po Once Daily 3)  Lipitor 80 Mg Tabs (Atorvastatin Calcium) .[name].. 1 Tab Daily 4)  Omeprazole 20 Mg Tbec (Omeprazole) .[name].. 1 Tab Daily 5)  Omega-3 Fish Oil 1200 Mg Caps (Omega-3 Fatty Acids) .[name].. 1 Tab Daily 6)  Adult Aspirin Ec Low Strength 81 Mg Tbec (Aspirin) .[name].. 1po Once Daily 7)  Azithromycin 250 Mg Tabs (Azithromycin) .... 2po Qd For 1 Day, Then 1po Qd For 4days, Then Stop  Allergies (verified): No Known Drug Allergies  Past History:  Past Medical History: Last updated: 05/06/2008 CORONARY ARTERY DISEASE (ICD-414.00) HYPERTENSION (ICD-401.9) HYPERLIPIDEMIA (ICD-272.4) BRADYCARDIA (ICD-427.89) GERD (ICD-530.81) DIABETES MELLITUS, TYPE II (ICD-250.00) ACUTE SINUSITIS, UNSPECIFIED (ICD-461.9) MYOCARDIAL INFARCTION, HX OF (ICD-412) PREVENTIVE HEALTH CARE (ICD-V70.0) hx of post op mild anemia 8/08  Past Surgical History: Last updated: 02/09/2008 Coronary artery bypass graft s/p rectal abscess 2005 - dr Carolynne Edouard - with interanal sphincterotomy  Social History: Last updated: 02/09/2008 Married 3 children 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   Head:  normocephalic and atraumatic.   Eyes:  vision grossly intact, pupils equal, and pupils round.   Ears:  R ear normal and L ear normal.   Nose:  no external deformity and no nasal discharge.   Mouth:  no gingival abnormalities and  pharynx pink and moist.   Neck:  supple and no masses.   Lungs:  normal respiratory effort and normal breath sounds.   Heart:  normal rate and regular rhythm.   Abdomen:  soft, non-tender, and normal bowel sounds.   Msk:  no joint tenderness and no joint swelling.   Extremities:  no edema, no erythema  Neurologic:  alert & oriented X3, cranial nerves II-XII intact, and strength normal in all extremities.     Impression & Recommendations:  Problem # 1:  DIZZINESS (ICD-780.4) etiolgy not exactly clear, exam benign; most likly due to BP overcontrolled - see below  Problem # 2:  HYPERTENSION (ICD-401.9)  His updated medication list for this problem includes:    Metoprolol Tartrate 50 Mg Tabs (Metoprolol tartrate) .[name]... 1/2 tab daily    Lisinopril 20 Mg Tabs (Lisinopril) .[name]... 1po once daily  BP today: 98/50 Prior BP: 122/84 (08/30/2008)  Labs Reviewed: K+: 5.1 (08/23/2008) Creat: : 1.1 (08/23/2008)   Chol: 141 (08/23/2008)   HDL: 53.60 (08/23/2008)   LDL: 61 (08/23/2008)   TG: 130.0 (08/23/2008) decrease the ACE as above, f/u any worsening or persistent s/s  Problem # 3:  DIABETES MELLITUS, TYPE II (ICD-250.00) Assessment: Unchanged  His updated medication list for this problem includes:    Lisinopril 20 Mg Tabs (Lisinopril) .[name]... 1po once daily    Adult Aspirin Ec Low Strength 81 Mg Tbec (Aspirin) .[name]... 1po once daily  Labs Reviewed: Creat: 1.1 (08/23/2008)    Reviewed HgBA1c results: 6.1 (08/23/2008)  6.4 (02/09/2008) stable overall by hx and exam, ok to continue meds/tx as is, no OHA needed at this time  Complete Medication List: 1)  Metoprolol Tartrate 50 Mg Tabs (Metoprolol tartrate) .... 1/2 tab daily 2)  Lisinopril 20 Mg Tabs (Lisinopril) .[name].. 1po once daily 3)  Lipitor 80 Mg Tabs (Atorvastatin calcium) .[name].. 1 tab daily 4)  Omeprazole 20 Mg Tbec (Omeprazole) .[name].. 1 tab daily 5)  Omega-3 Fish Oil 1200 Mg Caps (Omega-3 fatty acids) .[name].. 1 tab daily 6)  Adult Aspirin  Ec Low Strength 81 Mg Tbec (Aspirin) .[name].. 1po once daily 7)  Azithromycin 250 Mg Tabs (Azithromycin) .... 2po qd for 1 day, then 1po qd for 4days, then stop  Patient Instructions: 1)  there is no dehydration today 2)  take on HALF of the lisinopril 40 mg  pill until the pills you have are used up and the bottle is empty 3)  THEN, change to the new prescription for the Lisinopril 20 mg per day (lower strength) 4)  Continue all previous medications as before this visit 5)  followup as as recommended at your last office visit 6)  Check your Blood Pressure regularly. If it is above 130/90: you should make an appointment. Prescriptions: LISINOPRIL 20 MG TABS (LISINOPRIL) 1po once daily  #90 x 3   Entered and Authorized by:  Corwin Levins MD   Signed by:   Corwin Levins MD on 10/02/2008   Method used:   Print then Give to Patient   RxID:   [id number]

## 2010-02-10 NOTE — Miscellaneous (Signed)
Summary: Device preload  Clinical Lists Changes  Observations: Added new observation of PPM INDICATN: Sick sinus syndrome (11/18/2008 13:41) Added new observation of MAGNET RTE: BOL 85 ERI  65 (11/18/2008 13:41) Added new observation of PPMLEADSTAT2: active (11/18/2008 13:41) Added new observation of PPMLEADSER2: [id number] (11/18/2008 13:41) Added new observation of PPMLEADMOD2: 5076  (11/18/2008 13:41) Added new observation of PPMLEADLOC2: RV  (11/18/2008 13:41) Added new observation of PPMLEADSTAT1: active  (11/18/2008 13:41) Added new observation of PPMLEADSER1: [id number]  (11/18/2008 13:41) Added new observation of PPMLEADMOD1: 5076  (11/18/2008 13:41) Added new observation of PPMLEADLOC1: RA  (11/18/2008 13:41) Added new observation of PPM IMP MD: Hillis Range, MD  (11/18/2008 13:41) Added new observation of PPMLEADDOI2: 11/11/2008  (11/18/2008 13:41) Added new observation of PPMLEADDOI1: 11/11/2008  (11/18/2008 13:41) Added new observation of PPM DOI: 11/11/2008  (11/18/2008 13:41) Added new observation of PPM SERL#: [id number]  (11/18/2008 13:41) Added new observation of PPM MODL#: [id number]  (11/18/2008 65:78) Added new observation of PACEMAKERMFG: Medtronic  (11/18/2008 13:41) Added new observation of PACEMAKER MD: Hillis Range, MD  (11/18/2008 13:41)      PPM Specifications Following MD:  Hillis Range, MD     PPM Vendor:  Medtronic     PPM Model Number:  [id number]     PPM Serial Number:  [id number] PPM DOI:  11/11/2008     PPM Implanting MD:  Hillis Range, MD  Lead 1    Location: RA     DOI: 11/11/2008     Model #: [id number]     Serial #: [id number]     Status: active Lead 2    Location: RV     DOI: 11/11/2008     Model #: [id number]     Serial #: [id number]     Status: active  Magnet Response Rate:  BOL 85 ERI  65  Indications:  Sick sinus syndrome

## 2010-02-12 NOTE — Progress Notes (Addendum)
Summary: Schedule Colonoscopy   Phone Note Outgoing Call Call back at Home Phone [phone]   Call placed by: Harlow Mares CMA Duncan Dull),  January 14, 2010 8:35 AM Call placed to: Patient Summary of Call: patient states he never recieved the letter we mailed him and he will schedule his colonoscopy in March or April. I will send myself a flg and call the patient in feb. Initial call taken by: Harlow Mares CMA (AAMA),  January 14, 2010 8:36 AM     Appended Document: Schedule Colonoscopy called pt Left a message on patients machine to call back.

## 2010-02-27 ENCOUNTER — Other Ambulatory Visit: Payer: Self-pay

## 2010-03-03 ENCOUNTER — Other Ambulatory Visit: Payer: Self-pay | Admitting: Internal Medicine

## 2010-03-03 ENCOUNTER — Encounter (INDEPENDENT_AMBULATORY_CARE_PROVIDER_SITE_OTHER): Payer: Self-pay | Admitting: *Deleted

## 2010-03-03 ENCOUNTER — Other Ambulatory Visit: Payer: Self-pay

## 2010-03-03 DIAGNOSIS — E785 Hyperlipidemia, unspecified: Secondary | ICD-10-CM

## 2010-03-03 DIAGNOSIS — Z0000 Encounter for general adult medical examination without abnormal findings: Secondary | ICD-10-CM

## 2010-03-03 DIAGNOSIS — Z0389 Encounter for observation for other suspected diseases and conditions ruled out: Secondary | ICD-10-CM

## 2010-03-03 DIAGNOSIS — E119 Type 2 diabetes mellitus without complications: Secondary | ICD-10-CM

## 2010-03-03 LAB — URINALYSIS
Bilirubin Urine: NEGATIVE
Hgb urine dipstick: NEGATIVE
Ketones, ur: NEGATIVE
Leukocytes, UA: NEGATIVE
Nitrite: NEGATIVE
Specific Gravity, Urine: >=1.03 (ref 1.000–1.030)
Total Protein, Urine: NEGATIVE
Urine Glucose: NEGATIVE
Urobilinogen, UA: 0.2 (ref 0.0–1.0)
pH: 6 (ref 5.0–8.0)

## 2010-03-03 LAB — CBC WITH DIFFERENTIAL/PLATELET
Basophils Absolute: 0.1 10*3/uL (ref 0.0–0.1)
Basophils Relative: 0.7 % (ref 0.0–3.0)
Eosinophils Absolute: 0.4 10*3/uL (ref 0.0–0.7)
Eosinophils Relative: 5 % (ref 0.0–5.0)
HCT: 39.2 % (ref 39.0–52.0)
Hemoglobin: 13.4 g/dL (ref 13.0–17.0)
Lymphocytes Relative: 30.1 % (ref 12.0–46.0)
Lymphs Abs: 2.5 10*3/uL (ref 0.7–4.0)
MCHC: 34.2 g/dL (ref 30.0–36.0)
MCV: 91.8 fl (ref 78.0–100.0)
Monocytes Absolute: 0.7 10*3/uL (ref 0.1–1.0)
Monocytes Relative: 8.6 % (ref 3.0–12.0)
Neutro Abs: 4.5 10*3/uL (ref 1.4–7.7)
Neutrophils Relative %: 55.6 % (ref 43.0–77.0)
Platelets: 215 10*3/uL (ref 150.0–400.0)
RBC: 4.27 Mil/uL (ref 4.22–5.81)
RDW: 12.6 % (ref 11.5–14.6)
WBC: 8.2 10*3/uL (ref 4.5–10.5)

## 2010-03-03 LAB — PSA: PSA: 0.26 ng/mL (ref 0.10–4.00)

## 2010-03-03 LAB — TSH: TSH: 2.66 u[IU]/mL (ref 0.35–5.50)

## 2010-03-03 LAB — BASIC METABOLIC PANEL
BUN: 19 mg/dL (ref 6–23)
CO2: 28 mEq/L (ref 19–32)
Calcium: 9.7 mg/dL (ref 8.4–10.5)
Chloride: 105 mEq/L (ref 96–112)
Creatinine, Ser: 1.1 mg/dL (ref 0.4–1.5)
GFR: 76.8 mL/min (ref >60.00)
Glucose, Bld: 132 mg/dL — ABNORMAL HIGH (ref 70–99)
Potassium: 4.7 mEq/L (ref 3.5–5.1)
Sodium: 139 mEq/L (ref 135–145)

## 2010-03-03 LAB — HEPATIC FUNCTION PANEL
ALT: 39 U/L (ref 0–53)
AST: 32 U/L (ref 0–37)
Albumin: 3.6 g/dL (ref 3.5–5.2)
Alkaline Phosphatase: 42 U/L (ref 39–117)
Bilirubin, Direct: 0.1 mg/dL (ref 0.0–0.3)
Total Bilirubin: 0.2 mg/dL — ABNORMAL LOW (ref 0.3–1.2)
Total Protein: 6.8 g/dL (ref 6.0–8.3)

## 2010-03-03 LAB — LIPID PANEL
Cholesterol: 148 mg/dL (ref 0–200)
HDL: 51.5 mg/dL (ref >39.00)
Total CHOL/HDL Ratio: 3
Triglycerides: 448 mg/dL — ABNORMAL HIGH (ref 0.0–149.0)
VLDL: 89.6 mg/dL — ABNORMAL HIGH (ref 0.0–40.0)

## 2010-03-03 LAB — LDL CHOLESTEROL, DIRECT: Direct LDL: 47.8 mg/dL

## 2010-03-03 LAB — HEMOGLOBIN A1C: Hgb A1c MFr Bld: 6.8 % — ABNORMAL HIGH (ref 4.6–6.5)

## 2010-03-06 ENCOUNTER — Encounter: Payer: Self-pay | Admitting: Internal Medicine

## 2010-03-06 ENCOUNTER — Ambulatory Visit (INDEPENDENT_AMBULATORY_CARE_PROVIDER_SITE_OTHER): Payer: BC Managed Care – PPO | Admitting: Internal Medicine

## 2010-03-06 DIAGNOSIS — Z0000 Encounter for general adult medical examination without abnormal findings: Secondary | ICD-10-CM

## 2010-03-10 NOTE — Assessment & Plan Note (Signed)
Summary: 6 MOS F/U # /CD   Vital Signs:  Patient profile:   55 year old male Height:      64 inches Weight:      164.25 pounds BMI:     28.30 O2 Sat:      97 % on Room air Temp:     98 degrees F oral Pulse rate:   68 / minute BP sitting:   100 / 70  (left arm) Cuff size:   regular  Vitals Entered By: Zella Ball Ewing CMA Duncan Dull) (March 06, 2010 8:56 AM)  O2 Flow:  Room air  CC: 6 month ROV/RE, Back Pain   Primary Care Provider:  Corwin Levins MD  CC:  6 month ROV/RE and Back Pain.  History of Present Illness: here for /u - overall doing ok;  Pt denies CP, worsening sob, doe, wheezing, orthopnea, pnd, worsening Quiles edema, palps, dizziness or syncope  Pt denies new neuro symptoms such as headache, facial or extremity weakness  Pt denies polydipsia, polyuria, or low sugar symptoms such as shakiness improved with eating.  Overall good compliance with meds, trying to follow low chol, DM diet, wt up 4 lbs from last yr, little excercise however  CBG's in the lower 100's.  No fever, wt loss, night sweats, loss of appetite or other constitutional symptoms  Denies worsening depressive symptoms, suicidal ideation, or panic.   Overall good compliance with meds, and good tolerability.  Pt states good ability with ADL's, low fall risk, home safety reviewed and adequate, no significant change in hearing or vision, trying to follow lower chol diet, and  active with regular excercise - walks 45 min for 5 days/wk at 3.0 on the treadmill  No new complaints.    Preventive Screening-Counseling & Management      Drug Use:  no.    Problems Prior to Update: 1)  Other Diseases of Nasal Cavity and Sinuses  (ICD-478.19) 2)  Atrial Fibrillation  (ICD-427.31) 3)  Dizziness  (ICD-780.4) 4)  Chest Pain  (ICD-786.50) 5)  Coronary Artery Disease  (ICD-414.00) 6)  Hypertension  (ICD-401.9) 7)  Hyperlipidemia  (ICD-272.4) 8)  Bradycardia  (ICD-427.89) 9)  Gerd  (ICD-530.81) 10)  Diabetes Mellitus, Type II   (ICD-250.00) 11)  Acute Sinusitis, Unspecified  (ICD-461.9) 12)  Myocardial Infarction, Hx of  (ICD-412) 13)  Preventive Health Care  (ICD-V70.0)  Medications Prior to Update: 1)  Metoprolol Succinate 100 Mg Xr24h-Tab (Metoprolol Succinate) .[name].. 1po Once Daily 2)  Lisinopril 40 Mg Tabs (Lisinopril) .... 1/2 Po Two Times A Day 3)  Lipitor 80 Mg Tabs (Atorvastatin Calcium) .[name].. 1 Tab Daily 4)  Omega-3 Fish Oil 1200 Mg Caps (Omega-3 Fatty Acids) .[name].. 1 Tab Twice Daily 5)  Aspirin 325 Mg  Tabs (Aspirin) .[name].. 1 By Mouth Daily 6)  Acetaminophen 325 Mg  Tabs (Acetaminophen) .... As Needed 7)  Omeprazole 20 Mg Cpdr (Omeprazole) .... 2 By Mouth Once Daily 8)  Mupirocin 2 % Oint (Mupirocin) .... Use Asd Three Times A Day For 7 Days  Current Medications (verified): 1)  Metoprolol Succinate 100 Mg Xr24h-Tab (Metoprolol Succinate) .[name].. 1po Once Daily 2)  Lisinopril 40 Mg Tabs (Lisinopril) .... 1/2 Po Two Times A Day 3)  Lipitor 80 Mg Tabs (Atorvastatin Calcium) .[name].. 1 Tab Daily 4)  Omega-3 Fish Oil 1200 Mg Caps (Omega-3 Fatty Acids) .[name].. 1 Tab Twice Daily 5)  Aspirin 325 Mg  Tabs (Aspirin) .[name].. 1 By Mouth Daily 6)  Acetaminophen 325 Mg  Tabs (  Acetaminophen) .... As Needed 7)  Omeprazole 20 Mg Cpdr (Omeprazole) .... 2 By Mouth Once Daily 8)  Mupirocin 2 % Oint (Mupirocin) .... Use Asd Three Times A Day For 7 Days  Allergies (verified): No Known Drug Allergies  Past History:  Past Medical History: Last updated: 12/11/2008 1. Coronary artery disease.  The patient presented in August 2008 with acute coronary syndrome.  He did have a bypass surgery.  He did have 3-vessel disease on heart catheterization and had bypass surgery with LIMA to the LAD, sequential vein graft to the obtuse marginal-1 and obtuse marginal-2, and vein graft to the RCA.  Adenosine myoview (11/10): EF 64%, normal wall motion, normal perfusion.  2. Echocardiogram, November 2008, EF 65-70%.  No regional wall motion abnormalities.   Trivial mitral regurgitation. 3. Gastroesophageal reflux disease. 4. Hypertension. 5. Hyperlipidemia. 6. History of tobacco abuse.  The patient quit smoking in 2008.  7. Sick sinus syndrome with presyncope: Medtronic dual chamber PCM placed 11/10.  8. DIABETES MELLITUS, TYPE II (ICD-250.00) 9. hx of post op mild anemia 8/08 10.  Paroxysmal atrial fibrillation: only episode noted briefly during hospitalizatoin for PCM placement in 11/10.   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         all otherwise negative per pt -    Physical Exam  General:  alert and overweight-appearing.   Head:  normocephalic and atraumatic.   Eyes:  vision grossly intact, pupils equal, and pupils round.   Ears:  R ear normal and L ear normal.   Nose:  no external deformity and no nasal discharge.     Mouth:  no gingival abnormalities and pharynx pink and moist.   Neck:  supple and no masses.   Lungs:  normal respiratory effort.   Heart:  normal rate and regular rhythm.   Abdomen:  soft, non-tender, and normal bowel sounds.   Msk:  no joint tenderness and no joint swelling.   Extremities:  no edema, no erythema  Neurologic:  cranial nerves II-XII intact and strength normal in all extremities.   Skin:  color normal and no rashes.   Psych:  not anxious appearing and not depressed appearing.     Impression & Recommendations:  Problem # 1:  Preventive Health Care (ICD-V70.0)  Overall doing well, age appropriate education and counseling updated, referral for preventive services and immunizations addressed, dietary counseling and smoking status adressed , most recent labs reviewed, ecg reviewed I have personally reviewed and have noted 1.The patient's medical and social history 2.Their use of alcohol, tobacco or  illicit drugs 3.Their current medications and supplements 4. Functional ability including ADL's, fall risk, home safety risk, hearing & visual impairment  5.Diet and physical activities 6.Evidence for depression or mood disorders The patients weight, height, BMI  have been recorded in the chart I have made referrals, counseling and provided education to the patient based review of the above   Orders: Gastroenterology Referral (GI)  Problem # 2:  DIABETES MELLITUS, TYPE II (ICD-250.00)  His updated medication list for this problem includes:    Lisinopril 40 Mg Tabs (Lisinopril) .[name]... 1/2 po two times a day    Aspirin 325 Mg Tabs (Aspirin) .[name]... 1 by mouth daily  Labs Reviewed: Creat: 1.1 (03/03/2010)    Reviewed HgBA1c results: 6.8 (03/03/2010)  6.5 (02/28/2009) mild increased overal a1c, but still < 7/0;  Pt to cont DM diet, excercise, wt control efforts; to check labs again next visit, declines dietary referral today  Complete Medication List: 1)  Metoprolol  Succinate 100 Mg Xr24h-tab (Metoprolol succinate) .[name].. 1po once daily 2)  Lisinopril 40 Mg Tabs (Lisinopril) .... 1/2 po two times a day 3)  Lipitor 80 Mg Tabs (Atorvastatin calcium) .[name].. 1 tab daily 4)  Omega-3 Fish Oil 1200 Mg Caps (Omega-3 fatty acids) .[name].. 1 tab twice daily 5)  Aspirin 325 Mg Tabs (Aspirin) .[name].. 1 by mouth daily 6)  Acetaminophen 325 Mg Tabs (Acetaminophen) .... As needed 7)  Omeprazole 20 Mg Cpdr (Omeprazole) .... 2 by mouth once daily 8)  Mupirocin 2 % Oint (Mupirocin) .... Use asd three times a day for 7 days   Patient Instructions: 1)  Continue all previous medications as before this visit  2)  You will be contacted about the referral(s) to: colonoscopy 3)  Please schedule a follow-up appointment in 6 months with: 4)  BMP prior to visit, ICD-9: 250.02 5)  Lipid Panel prior to visit, ICD-9: 6)  HbgA1C prior to visit, ICD-9:   Orders Added: 1)  Gastroenterology Referral [GI] 2)  Est. Patient 40-64 years [phone]

## 2010-03-17 ENCOUNTER — Encounter: Payer: Self-pay | Admitting: Gastroenterology

## 2010-03-19 ENCOUNTER — Encounter (INDEPENDENT_AMBULATORY_CARE_PROVIDER_SITE_OTHER): Payer: BC Managed Care – PPO

## 2010-03-19 ENCOUNTER — Encounter: Payer: Self-pay | Admitting: Internal Medicine

## 2010-03-19 DIAGNOSIS — I495 Sick sinus syndrome: Secondary | ICD-10-CM

## 2010-03-24 NOTE — Letter (Signed)
Summary: Colonoscopy Letter  [facility]  [address] [place], [place] [address]   Phone: [phone]  Fax: [phone]      March 17, 2010 MRN: [id number]   [name] [name] [address] [place], [place]  [address]   Dear Mr. NAVARRETE,   According to your medical record, it is time for you to schedule a Colonoscopy. The American Cancer Society recommends this procedure as a method to detect early colon cancer. Patients with a family history of colon cancer, or a personal history of colon polyps or inflammatory bowel disease are at increased risk.  This letter has been generated based on the recommendations made at the time of your procedure. If you feel that in your particular situation this may no longer apply, please contact our office.  Please call our office at [phone] to schedule this appointment or to update your records at your earliest convenience.  Thank you for cooperating with [place] to provide you with the very best care possible.   Sincerely,  Rachael Fee, M.D.  [facility] [phone]

## 2010-03-30 ENCOUNTER — Encounter: Payer: Self-pay | Admitting: *Deleted

## 2010-04-03 ENCOUNTER — Encounter (INDEPENDENT_AMBULATORY_CARE_PROVIDER_SITE_OTHER): Payer: Self-pay | Admitting: *Deleted

## 2010-04-09 NOTE — Cardiovascular Report (Signed)
Summary: Office Visit   Office Visit   Imported By: Roderic Ovens 03/31/2010 14:08:21  _____________________________________________________________________  External Attachment:    Type:   Image     Comment:   External Document

## 2010-04-09 NOTE — Letter (Signed)
Summary: Remote Device Check  [facility], Main Office  [address]   [place], [place] [address]   Phone: [phone]  Fax: [phone]     March 30, 2010 MRN: [id number]   [name] [name] [address] [place], [place]  [address]   Dear [name],   Your remote transmission was recieved and reviewed by your physician.  All diagnostics were within normal limits for you.  __X___Your next transmission is scheduled for:  06-18-10.  Please transmit at any time this day.  If you have a wireless device your transmission will be sent automatically.  Sincerely,  Vella Kohler

## 2010-04-09 NOTE — Letter (Signed)
Summary: Referral - not able to see patient  [facility]  [address]   [place], [place] [address]   Phone: [phone]  Fax: [phone]    April 03, 2010   Re:   [name] [name] DOB:  Jun 30, 1955 MRN:   [id number]    Dear Dr. Oliver Barre:  Thank you for your kind referral of the above patient.  We have attempted to schedule the recommended procedure colonoscopy but have not been able to schedule because:  _x__ The patient was not available by phone and/or has not returned our calls.  ___ The patient declined to schedule the procedure at this time.  We appreciate the referral and hope that we will have the opportunity to treat this patient in the future.    Sincerely,    [facility] [phone]

## 2010-04-15 LAB — CBC
HCT: 39.4 % (ref 39.0–52.0)
HCT: 39.8 % (ref 39.0–52.0)
Hemoglobin: 13.6 g/dL (ref 13.0–17.0)
Hemoglobin: 13.8 g/dL (ref 13.0–17.0)
MCHC: 34.6 g/dL (ref 30.0–36.0)
MCHC: 34.6 g/dL (ref 30.0–36.0)
MCV: 94.7 fL (ref 78.0–100.0)
MCV: 95 fL (ref 78.0–100.0)
Platelets: 227 10*3/uL (ref 150–400)
Platelets: 252 10*3/uL (ref 150–400)
RBC: 4.16 MIL/uL — ABNORMAL LOW (ref 4.22–5.81)
RBC: 4.19 MIL/uL — ABNORMAL LOW (ref 4.22–5.81)
RDW: 11.7 % (ref 11.5–15.5)
RDW: 11.9 % (ref 11.5–15.5)
WBC: 12.9 10*3/uL — ABNORMAL HIGH (ref 4.0–10.5)
WBC: 6.8 10*3/uL (ref 4.0–10.5)

## 2010-04-15 LAB — GLUCOSE, CAPILLARY
Glucose-Capillary: 108 mg/dL — ABNORMAL HIGH (ref 70–99)
Glucose-Capillary: 121 mg/dL — ABNORMAL HIGH (ref 70–99)
Glucose-Capillary: 124 mg/dL — ABNORMAL HIGH (ref 70–99)
Glucose-Capillary: 126 mg/dL — ABNORMAL HIGH (ref 70–99)
Glucose-Capillary: 205 mg/dL — ABNORMAL HIGH (ref 70–99)
Glucose-Capillary: 325 mg/dL — ABNORMAL HIGH (ref 70–99)

## 2010-04-15 LAB — CARDIAC PANEL(CRET KIN+CKTOT+MB+TROPI)
CK, MB: 1.4 ng/mL (ref 0.3–4.0)
CK, MB: 1.4 ng/mL (ref 0.3–4.0)
CK, MB: 1.5 ng/mL (ref 0.3–4.0)
Relative Index: INVALID (ref 0.0–2.5)
Relative Index: INVALID (ref 0.0–2.5)
Relative Index: INVALID (ref 0.0–2.5)
Total CK: 54 U/L (ref 7–232)
Total CK: 63 U/L (ref 7–232)
Total CK: 73 U/L (ref 7–232)
Troponin I: 0.04 ng/mL (ref 0.00–0.06)
Troponin I: 0.04 ng/mL (ref 0.00–0.06)
Troponin I: 0.05 ng/mL (ref 0.00–0.06)

## 2010-04-15 LAB — APTT: aPTT: 28 seconds (ref 24–37)

## 2010-04-15 LAB — BASIC METABOLIC PANEL
BUN: 10 mg/dL (ref 6–23)
BUN: 11 mg/dL (ref 6–23)
CO2: 26 mEq/L (ref 19–32)
CO2: 29 mEq/L (ref 19–32)
Calcium: 9.3 mg/dL (ref 8.4–10.5)
Calcium: 9.4 mg/dL (ref 8.4–10.5)
Chloride: 104 mEq/L (ref 96–112)
Chloride: 105 mEq/L (ref 96–112)
Creatinine, Ser: 0.91 mg/dL (ref 0.4–1.5)
Creatinine, Ser: 1 mg/dL (ref 0.4–1.5)
GFR calc Af Amer: >60 mL/min (ref >60)
GFR calc Af Amer: >60 mL/min (ref >60)
GFR calc non Af Amer: >60 mL/min (ref >60)
GFR calc non Af Amer: >60 mL/min (ref >60)
Glucose, Bld: 130 mg/dL — ABNORMAL HIGH (ref 70–99)
Glucose, Bld: 136 mg/dL — ABNORMAL HIGH (ref 70–99)
Potassium: 4.1 mEq/L (ref 3.5–5.1)
Potassium: 4.4 mEq/L (ref 3.5–5.1)
Sodium: 137 mEq/L (ref 135–145)
Sodium: 139 mEq/L (ref 135–145)

## 2010-04-16 LAB — TSH: TSH: 3.488 u[IU]/mL (ref 0.350–4.500)

## 2010-04-16 LAB — BASIC METABOLIC PANEL
BUN: 19 mg/dL (ref 6–23)
CO2: 27 mEq/L (ref 19–32)
Calcium: 9.6 mg/dL (ref 8.4–10.5)
Chloride: 102 mEq/L (ref 96–112)
Creatinine, Ser: 0.9 mg/dL (ref 0.4–1.5)
GFR calc Af Amer: >60 mL/min (ref >60)
GFR calc non Af Amer: >60 mL/min (ref >60)
Glucose, Bld: 133 mg/dL — ABNORMAL HIGH (ref 70–99)
Potassium: 4.4 mEq/L (ref 3.5–5.1)
Sodium: 141 mEq/L (ref 135–145)

## 2010-04-16 LAB — CARDIAC PANEL(CRET KIN+CKTOT+MB+TROPI)
CK, MB: 1.4 ng/mL (ref 0.3–4.0)
CK, MB: 1.7 ng/mL (ref 0.3–4.0)
CK, MB: 2.2 ng/mL (ref 0.3–4.0)
CK, MB: <0.1 ng/mL — ABNORMAL LOW (ref 0.3–4.0)
Relative Index: INVALID (ref 0.0–2.5)
Relative Index: INVALID (ref 0.0–2.5)
Relative Index: INVALID (ref 0.0–2.5)
Total CK: 105 U/L (ref 7–232)
Total CK: 70 U/L (ref 7–232)
Total CK: 75 U/L (ref 7–232)
Total CK: 88 U/L (ref 7–232)
Troponin I: 0.05 ng/mL (ref 0.00–0.06)
Troponin I: 0.05 ng/mL (ref 0.00–0.06)
Troponin I: 0.06 ng/mL (ref 0.00–0.06)
Troponin I: 0.07 ng/mL — ABNORMAL HIGH (ref 0.00–0.06)

## 2010-04-16 LAB — COMPREHENSIVE METABOLIC PANEL
ALT: 36 U/L (ref 0–53)
AST: 25 U/L (ref 0–37)
Albumin: 3.7 g/dL (ref 3.5–5.2)
Alkaline Phosphatase: 36 U/L — ABNORMAL LOW (ref 39–117)
BUN: 18 mg/dL (ref 6–23)
CO2: 26 mEq/L (ref 19–32)
Calcium: 9.9 mg/dL (ref 8.4–10.5)
Chloride: 103 mEq/L (ref 96–112)
Creatinine, Ser: 1.57 mg/dL — ABNORMAL HIGH (ref 0.4–1.5)
GFR calc Af Amer: 56 mL/min — ABNORMAL LOW (ref >60)
GFR calc non Af Amer: 46 mL/min — ABNORMAL LOW (ref >60)
Glucose, Bld: 94 mg/dL (ref 70–99)
Potassium: 4 mEq/L (ref 3.5–5.1)
Sodium: 138 mEq/L (ref 135–145)
Total Bilirubin: 0.6 mg/dL (ref 0.3–1.2)
Total Protein: 7.1 g/dL (ref 6.0–8.3)

## 2010-04-16 LAB — GLUCOSE, CAPILLARY
Glucose-Capillary: 123 mg/dL — ABNORMAL HIGH (ref 70–99)
Glucose-Capillary: 136 mg/dL — ABNORMAL HIGH (ref 70–99)
Glucose-Capillary: 148 mg/dL — ABNORMAL HIGH (ref 70–99)
Glucose-Capillary: 160 mg/dL — ABNORMAL HIGH (ref 70–99)
Glucose-Capillary: 235 mg/dL — ABNORMAL HIGH (ref 70–99)

## 2010-04-16 LAB — CBC
HCT: 42.5 % (ref 39.0–52.0)
Hemoglobin: 14.6 g/dL (ref 13.0–17.0)
MCHC: 34.5 g/dL (ref 30.0–36.0)
MCV: 93.7 fL (ref 78.0–100.0)
Platelets: 256 10*3/uL (ref 150–400)
RBC: 4.53 MIL/uL (ref 4.22–5.81)
RDW: 11 % — ABNORMAL LOW (ref 11.5–15.5)
WBC: 7.8 10*3/uL (ref 4.0–10.5)

## 2010-04-16 LAB — LIPID PANEL
Cholesterol: 130 mg/dL (ref 0–200)
HDL: 55 mg/dL (ref >39)
LDL Cholesterol: 42 mg/dL (ref 0–99)
Total CHOL/HDL Ratio: 2.4 RATIO
Triglycerides: 165 mg/dL — ABNORMAL HIGH (ref <150)
VLDL: 33 mg/dL (ref 0–40)

## 2010-04-16 LAB — DIFFERENTIAL
Basophils Absolute: 0.1 10*3/uL (ref 0.0–0.1)
Basophils Relative: 1 % (ref 0–1)
Eosinophils Absolute: 0.2 10*3/uL (ref 0.0–0.7)
Eosinophils Relative: 3 % (ref 0–5)
Lymphocytes Relative: 19 % (ref 12–46)
Lymphs Abs: 1.4 10*3/uL (ref 0.7–4.0)
Monocytes Absolute: 0.5 10*3/uL (ref 0.1–1.0)
Monocytes Relative: 7 % (ref 3–12)
Neutro Abs: 5.6 10*3/uL (ref 1.7–7.7)
Neutrophils Relative %: 71 % (ref 43–77)

## 2010-04-16 LAB — PROTIME-INR
INR: 0.94 (ref 0.00–1.49)
Prothrombin Time: 12.5 seconds (ref 11.6–15.2)

## 2010-04-16 LAB — POCT CARDIAC MARKERS
CKMB, poc: 4.1 ng/mL (ref 1.0–8.0)
Myoglobin, poc: 57.4 ng/mL (ref 12–200)
Troponin i, poc: <0.05 ng/mL (ref 0.00–0.09)

## 2010-04-16 LAB — MAGNESIUM: Magnesium: 2.6 mg/dL — ABNORMAL HIGH (ref 1.5–2.5)

## 2010-05-26 NOTE — Assessment & Plan Note (Signed)
[facility]                            CARDIOLOGY OFFICE NOTE   NAME:Impson, ZAYVIEN CANNING                            MRN:          [id number]  DATE:07/11/2007                            DOB:          07-29-1955    REASON FOR VISIT:  Chest pain and questions about Plavix.   HISTORY OF PRESENT ILLNESS:  I just recently saw Mr. Bollig in May.  He was  doing well at that time.  He scheduled a visit to come into the office  today asking about Plavix (a friend mentioned it to him) and also  described some chest discomfort that he has experienced perhaps once or  twice weekly.  He feels a pulling discomfort in his upper chest, usually  when he lifts heavy things at work, but nothing particularly troublesome  he says.  He states that these symptoms last less than a minute, and he  has not had been using nitroglycerin for this.  He is worried in general  about another heart attack.  We spoke about Plavix some today.  He had  an acute coronary syndrome back in August 2008 and underwent a coronary  artery bypass grafting at that time.  He has not undergone any previous  stent placement and is now a year out from this event.  We talked about  the pros and cons of Plavix including its expense and the fact that his  medical regimen at baseline is actually fairly reasonable.  I did not  feel strongly that we needed to add this medicine, although I told him  that I would if he wanted to.  More importantly, we talked about his  chest pain, and he remains very concerned about this.  I felt that a  followup stress test would be in order.   ALLERGIES:  No known drug allergies.   PRESENT MEDICATIONS:  1. Enteric-coated aspirin 81 mg p.o. daily.  2. Omeprazole 20 mg p.o. daily.  3. Metoprolol 25 mg p.o. b.i.d.  4. Lipitor 80 mg p.o. daily.  5. Lisinopril 20 mg p.o. daily.  6. Nitroglycerin 0.5 mg sublingual p.r.n.   REVIEW OF SYSTEMS:  As per history of present illness.   PHYSICAL EXAMINATION:  VITAL SIGNS:  Blood pressure is 134/81, heart  rate is 52, and weight is 159 pounds.  GENERAL:  The patient is comfortable and in no acute distress.  HEENT:  Conjunctivae somewhat injected.  Lids normal.  Oropharynx clear.  NECK:  Supple.  No elevated jugular venous pressure.  No audible bruits.  LUNGS:  Clear without labored breathing.  No egophony.  No wheezing.  CARDIAC:  Regular rate and rhythm. No loud murmur or gallop.  ABDOMEN:  Soft and nontender.  Normoactive bowel sounds.  EXTREMITIES:  Exhibit no frank pitting edema.  Distal pulses are 2+.  SKIN:  Warm and dry.  MUSCULOSKELETAL:  Kyphosis noted.  NEUROPSYCHIATRIC:  The patient is alert and oriented x3.  Affect is  normal.   IMPRESSION AND RECOMMENDATIONS:  Multivessel cardiovascular disease  status post acute coronary syndrome in August  2008 with subsequent  coronary artery bypass grafting and subsequent normalization of left  ventricular systolic function.  I did not feel strongly about adding  Plavix in light of the time course from his event and in the absence of  any percutaneous intervention.  His medical regimen is reasonable, and I  felt that a followup adenosine Myoview with low level ambulation on  medications to exclude any significant degree of residual ischemia is  reasonable.  Assuming this is low risk, I anticipate medical therapy,  and plan to schedule followup in our clinic over the next 6 months with  Dr. Shirlee Latch given my transition to the [facility].  He  does describe symptoms of gastroesophageal reflux; he is already on a  proton pump inhibitor.  This might need to be further investigated if  things persist.     Jonelle Sidle, MD  Electronically Signed    SGM/MedQ  DD: 07/11/2007  DT: 07/12/2007  Job #: [id number]

## 2010-05-26 NOTE — Assessment & Plan Note (Signed)
[name]                            CARDIOLOGY OFFICE NOTE   [name], [name] [name]                            MRN:          [id number]  DATE:09/19/2006                            DOB:          [birth year]    REASON FOR VISIT:  Post-surgical followup.   HISTORY OF PRESENT ILLNESS:  [name] [name] is a 55 year old Asian male with  history of hypertension, tobacco use and hyperlipidemia.  He was  admitted to the hospital in mid-August, with an acute coronary syndrome  and underwent cardiac catheterization, ultimately revealing multivessel  coronary artery disease.  He had an ejection fraction estimated at 40%  and ultimately underwent coronary artery bypass grafting, with a LIMA to  the left anterior descending, sequential saphenous vein graft to the  first and second obtuse marginal and sequential saphenous vein graft to  the right coronary artery.  The patient tolerated the procedure well and  follows up today to discuss his progress.   He saw Dr. Donata Clay back in the office on the 5th of September.  He was  doing well at that time and cleared to drive, as well as return to work  with some lifting restrictions in mid-October.  He is walking on a  regular basis and states that he overall feels quite well, and that he  is recuperating well.  He is tolerating his medicines well.  Today, I  reviewed his medications.  He states that he stopped smoking.  His  electrocardiogram shows sinus bradycardia at 40 beats per minute, with  left ventricular hypertrophy and probably repolarization abnormalities.   ALLERGIES:  NO KNOWN DRUG ALLERGIES.   PRESENT MEDICATIONS:  1. Aspirin 325 mg p.o. q.d.  2. Lopressor 25 mg p.o. b.i.d.  3. Lisinopril 20 mg p.o. q.d.  4. Lipitor 80 mg p.o. q.d.   REVIEW OF SYSTEMS:  As described in history of present illness.  It was  negative.   PHYSICAL EXAMINATION:  VITAL SIGNS:  Blood pressure is 116/70, heart  rate is 48, weight is 145  pounds.  GENERAL:  The patient is comfortable, in no acute distress.  NECK:  Shows no elevated jugular venous pressure, no loud bruits.  LUNGS:  Clear without labored breathing.  CARDIAC:  Reveals a regular rate and rhythm.  No S3 gallop or  pericardial rub.  Chest wall is stable with well-healed sternal  incision.  EXTREMITIES:  Show no significant pitting edema.   IMPRESSION:  1. Multi-vessel coronary artery disease with an ejection fraction of      approximately 40%, now status post coronary artery bypass grafting      and on medical therapy.  I will plan to see him back over the next      month.  He will ultimately need a follow-up echocardiogram to re-      assess left ventricular function following revascularization.  He      will have liver and lipid testing done      around the time of his next visit, and we can adjust the Statin  therapy from there.  2. Further plans to follow.     Jonelle Sidle, MD  Electronically Signed    SGM/MedQ  DD: 09/19/2006  DT: 09/20/2006  Job #: [id number]   cc:   Kerin Perna, M.D.

## 2010-05-26 NOTE — Op Note (Signed)
NAMEKEYMARI, SATO                     ACCOUNT NO.:  [id number]   MEDICAL RECORD NO.:  [id number]          PATIENT TYPE:  INP   LOCATION:  2315                         FACILITY:  [facility]   PHYSICIAN:  Kerin Perna, M.D.  DATE OF BIRTH:  March 19, 1955   DATE OF PROCEDURE:  08/30/2006  DATE OF DISCHARGE:                               OPERATIVE REPORT   OPERATIONS:  1. Coronary artery bypass grafting x4 (left internal mammary artery to      the LAD, sequential saphenous vein graft to OM1 and OM2, saphenous      vein graft to right coronary artery).  2. Endoscopic vein harvest of the greater saphenous vein from the      right leg.   PREOPERATIVE DIAGNOSIS:  Class IV post infarction unstable angina, with  three-vessel coronary artery disease, and ejection fraction of 40%.   POSTOPERATIVE DIAGNOSIS:  Class IV post infarction unstable angina, with  three-vessel coronary artery disease, and ejection fraction of 40%.   SURGEON:  Kerin Perna, M.D.   ASSISTANT:  Jacklynn Bue, S.A.   ANESTHESIA:  General.   INDICATIONS:  The patient is a 55 year old smoker who presented with  unstable angina and ruled in with positive cardiac enzymes.  Cardiac  catheterization demonstrated severe three-vessel coronary artery  disease, with ejection fraction of 40%.  He is felt to be a candidate  for surgical revascularization.  Prior to surgery, I examined the  patient in the CCU and reviewed the results of the cardiac  catheterization with the patient and family.  I discussed the  indications and expected benefits of coronary bypass surgery for  treatment of his coronary disease, as well as alternatives to surgery,  the benefits, and risks.  After reviewing these issues, he demonstrated  his understanding and agreed to proceed with the operation under what I  felt was an informed consent.   OPERATIVE FINDINGS:  The patient's coronaries were small and diffusely  diseased.  The vein was of good quality.   The mammary artery was small  but after papaverine had good flow.  The patient did not need any blood  products for the operation.   PROCEDURE:  The patient was brought to the operating room and placed  supine on the operating table, where general anesthesia was induced  under invasive hemodynamic monitoring.  The chest, abdomen, and legs  were prepped with Betadine and draped as a sterile field.  A sternal  incision was made as the saphenous vein was harvested endoscopically  from the right leg.  The left internal mammary artery was harvested as a  pedicle graft from its origin at the subclavian vessels.  Heparin was  administered after the vein was inspected and found to be adequate.  The  sternal retractor was placed, and the pericardium was suspended.  Pursestrings were placed in the ascending aorta and right atrium, and  the patient was cannulated and placed on bypass.  The coronaries were  identified for grafting, and the mammary artery and vein grafts were  prepared for the distal anastomoses.  Cardioplegia catheters were placed  for both antegrade and retrograde cold blood cardioplegia.  The patient  was cooled to 32 degrees, and the aortic crossclamp was applied.  800 cc  of cold blood cardioplegia was delivered between the antegrade and  retrograde cardioplegia catheters.  There was good cardioplegic arrest,  and septal temperature dropped less than 12 degrees.   The distal coronary anastomoses were then performed.  The first distal  anastomosis was to the distal right.  This was a 1.5 mm vessel, with a  proximal 70% stenosis.  A reverse saphenous vein was sewn end-to-side  with a running 7-0 Prolene, with good flow through graft.  The second  and third distal anastomoses consisted of a sequential vein graft to the  OM1 and OM2.  The OM1 was diffusely and heavily diseased, with a  proximal 80% stenosis.  A side-to-side anastomosis with a reverse  saphenous vein was sewn using  a running 7-0 Prolene.  The third distal  anastomosis was the continuation of the sequential graft to the OM2.  This was a large 1.5 mm vessel, and the end of the vein was sewn end-to-  side with a running 7-0 Prolene, and there was good flow through graft.  Cardioplegia was redosed.  The fourth distal anastomosis was to the mid-  LAD.  It had diffuse disease, with a proximal 90% stenosis.  The left  IMA pedicle was brought through an opening created in the left lateral  pericardium and was brought down onto the LAD and sewn end-to-side with  a running 8-0 Prolene.  There was good flow through the anastomosis  after briefly releasing the pedicle bulldog clamp on the mammary  pedicle.  The bulldog was reapplied, and the pedicle was secured to the  epicardium.  Cardioplegia was redosed.   While the crossclamp was still in place, two proximal vein anastomoses  were performed on the ascending aorta using a 4 mm punch with a running  6-0 Prolene.  Prior to tying down the final proximal anastomoses, air  was vented from the coronaries and the left side of heart using a dose  of retrograde warm blood cardioplegia and the usual filling maneuvers on  bypass.  The final proximal was tied, and the crossclamp was removed.   The heart resumed a spontaneous rhythm.  Air was aspirated from the vein  grafts.  Each had good flow.  The cardioplegia catheters were removed.  The proximal and distal anastomoses were checked and found to be  hemostatic.  Temporary pacing wires were applied.  The patient was  rewarmed to 37 degrees.  The lungs re-expanded, and the ventilator was  resumed.  The patient was weaned from bypass without difficulty.  Cardiac output and blood pressure were stable.  Protamine was  administered without adverse reaction.  The cannula was removed.  The  mediastinum was irrigated with warm antibiotic irrigation.  The superior  pericardial fat was closed.  Two mediastinal and a left  pleural chest  tube were placed and brought out through separate incisions.  The  sternum was closed with interrupted steel wire.  The pectoralis fascia  was closed with a running #1 Vicryl.  The subcutaneous and skin layers  were closed with a running Vicryl, and sterile dressings were applied.  Total bypass time was 120 minutes, with a crossclamp time of 88 minutes.      Kerin Perna, M.D.  Electronically Signed     PV/MEDQ  D:  08/30/2006  T:  08/31/2006  Job:  [id number]   cc:   [facility]

## 2010-05-26 NOTE — Cardiovascular Report (Signed)
[name] [name], [name] [name]                     ACCOUNT NO.:  [id number]   MEDICAL RECORD NO.:  [id number]          PATIENT TYPE:  INP   LOCATION:  2923                         FACILITY:  [facility]   PHYSICIAN:  Veverly Fells. Excell Seltzer, MD  DATE OF BIRTH:  1955/09/22   DATE OF PROCEDURE:  DATE OF DISCHARGE:                            CARDIAC CATHETERIZATION   PROCEDURE:  Left heart catheterization, selective coronary angiography,  left ventricular angiography.   INDICATIONS:  [name] [name] is a 55 year old gentleman, who has had stuttering  chest pain over the last 2 weeks.  He presented with a non-ST elevation  MI.  In the setting of multiple cardiac risk factors and significant  elevation of his troponin, he was referred for cardiac catheterization.   Risks and indications of the procedure were explained in detail to the  patient.  Informed consent was obtained.  The right groin was prepped,  draped, anesthetized with 1% lidocaine.  Using the modified Seldinger  technique, a 6-French sheath was placed in the right femoral artery  without difficulty.  Multiple views left and right coronary arteries  were taken, using standard Judkins pre-formed catheters.  Following  selective coronary angiography, an angled pigtail catheter was inserted  into the left ventricle and pressures were recorded.  Left  ventriculogram was performed.  Pullback across the aortic valve was  done.  At the conclusion of the procedure, the patient was transferred  to the holding area in stable condition.   FINDINGS:  Aortic pressure 110/67 with a mean of 87, left ventricular  pressure 107/23.   CORONARY ANGIOGRAPHY:  The left mainstem is angiographically normal.  It  bifurcates into the LAD and left circumflex.   The LAD is a large vessel at the ostium.  It immediately tapers, and I  suspect there is diffuse 50% stenosis throughout the proximal portion of  the LAD.  The vessel then courses down and wraps around the left  ventricular  apex.  In the midportion of the LAD, at the origin of the  second diagonal branch, there is a focal 90% stenosis.  Just beyond  that, there is diffuse non-obstructive disease.  The third diagonal  branch is a medium-size vessel.  The second diagonal branch is small and  has severe ostial stenosis of 90%.   The left circumflex courses down the AV groove and has moderate disease  in it's proximal portion of 50%.  Just prior to the second OM branch,  there is a 75% to 80% focal stenosis.  Second OM branch is medium size,  and there is no significant angiographic disease.  The circumflex then  courses down and supplies a third OM in a left posterolateral branch.  In that portion of the circumflex,  there is a 40% stenosis.   The right coronary artery is dominant.  The mid-portion of the right  coronary artery has 50% stenosis, followed by an area of 75% stenosis.  Distally, the vessel has no significant angiographic stenosis.  It  bifurcates into PDA and posterior AV segment.  The posterior AV segment  has an area of 60% stenosis, prior to supplying two posterolateral  branches.   Left ventricular function assessed by RAO ventriculography shows global  LV dysfunction with an estimated LVEF of 40%.  There is no mitral  regurgitation.   ASSESSMENT:  1. Severe left anterior descending stenosis.  2. Moderate left circumflex stenosis.  3. Moderately severe right coronary artery stenosis.  4. Moderate global left ventricular dysfunction.   SUMMARY:  I am going to review the revascularization options with [name].  While his multiple stenoses would be treatable percutaneously, he  would require multiple drug-eluting stents, and I really think he would  do better long-term with coronary bypass.  I will review this with the  patient and his family, and I am going to recommend coronary bypass  surgery first line, for more complete revascularization in this  gentleman with diffuse disease and a  high atheroma burden.  I am going  to resume heparin, 6 hours after his sheath is out.      Veverly Fells. Excell Seltzer, MD  Electronically Signed     MDC/MEDQ  D:  08/26/2006  T:  08/26/2006  Job:  [id number]

## 2010-05-26 NOTE — Assessment & Plan Note (Signed)
[facility]                            CARDIOLOGY OFFICE NOTE   NAME:[name], [name] DIVIS                            MRN:          [id number]  DATE:11/18/2006                            DOB:          August 27, 1955    REASON FOR VISIT:  Cardiac followup.   HISTORY OF PRESENT ILLNESS:  [name] [name] returns for a post surgical visit.  He is doing well, recuperating from coronary artery bypass surgery back  in August.  I reviewed his medications today and we talked about some  adjustments that would be made.  He did have followup lipids obtained in  late October demonstrating good LDL control at 76 and normal liver  function tests on Lipitor.  His electrocardiogram today shows marked  sinus bradycardia with nonspecific ST-T wave changes, specifically  effecting the lateral leads which are old.  He is back at work, and we  talked about some general lifting restrictions which should not exceed  50 pounds.   ALLERGIES:  No known drug allergies.   PRESENT MEDICATIONS:  1. Aspirin 325 mg p.o. daily.  2. Lisinopril 20 mg p.o. daily.  3. Lipitor 80 mg p.o. daily.  4. Imdur 15 mg p.o. daily.  5. Metoprolol 25 mg p.o. b.i.d.   REVIEW OF SYSTEMS:  As described in the history of present illness,  otherwise negative.   PHYSICAL EXAMINATION:  Blood pressure is 118/67, heart rate is 48,  weight is 150 pounds.  The patient is comfortable and in no acute distress.  HEENT:  Conjunctivae are normal, oropharynx clear.  NECK:  Supple no elevated jugular venous pressure, no loud bruits.  No  thyromegaly is noted.  LUNGS:  Clear without labored breathing at rest.  CARDIAC EXAM:  Reveals a regular rate and rhythm, no loud murmur or no  S3 gallop.  Chest wall is healing well, stable sternum.  ABDOMEN:  Soft, nontender, normoactive bowel sounds.  EXTREMITIES:  Exhibit no significant pitting edema.  Distal pulses are  2+.  SKIN:  Warm and dry.  MUSCULOSKELETAL:  No kyphosis is noted.  NEURO/PSYCHIATRIC:  Patient is alert and oriented x3.  Affect is normal.   IMPRESSION/RECOMMENDATIONS:  1. Multivessel coronary artery disease with ejection fraction of 40%,      status post coronary artery bypass grafting in August.  I have      asked [name] [name] to discontinue his Imdur and provided him a      prescription for p.r.n. sublingual nitroglycerin.  We will also      schedule him for a followup echocardiogram to reassess left      ventricular function, status post revascularization.  We can      continue to modify therapy from there.  We will see him back      over the next 6 months.  2. Hyperlipidemia, well controlled on Lipitor.     Jonelle Sidle, MD  Electronically Signed    SGM/MedQ  DD: 11/18/2006  DT: 11/18/2006  Job #: [id number]

## 2010-05-26 NOTE — Assessment & Plan Note (Signed)
[facility]                            CARDIOLOGY OFFICE NOTE   NAME:Bilek, VENNIE WAYMIRE                            MRN:          [id number]  DATE:05/19/2007                            DOB:          February 28, 1955    REASON FOR VISIT:  Cardiac followup.   HISTORY OF PRESENT ILLNESS:  Mr. Yaffe comes in for a 75-month visit.  He is  doing well.  He is not reporting any significant angina or  breathlessness.  He states he has been walking usually up to 30 minutes  at a time most days of the week.  He has also stopped smoking.  His LDL  was in good control last time I saw him at 76 and normal liver function  tests on Lipitor.  Electrocardiogram shows sinus bradycardia with left  ventricular hypertrophy.  He also had subsequent normalization of  ventricular function following revascularization with an ejection  fraction of 65-70% by followup echocardiography in November.   ALLERGIES:  No known drug allergies.   MEDICATIONS:  1. Lisinopril 20 mg p.o. daily.  2. Lipitor 80 mg p.o. daily.  3. Metoprolol 25 mg p.o. b.i.d.  4. Omeprazole 20 mg p.o. daily.  5. Enteric-coated aspirin 81 mg p.o. daily.  6. Sublingual nitroglycerin 0.4 mg p.r.n.   REVIEW OF SYSTEMS:  As described in the history of present illness.   EXAMINATION:  Blood pressure is 149/76, heart rate is 60, weight 158  pounds.  The patient is comfortable in no acute distress.  Examination neck reveals no elevated was pressure no loud bruits.  Lungs are clear without labored breathing.  CARDIAC:  Exam was a regular rate and rhythm murmur or gallop.  ABDOMEN:  Soft, no bruits.  Extremity show no pitting edema.  Distal pulses 2+.   IMPRESSION AND RECOMMENDATIONS:  Multivessel cardiovascular disease  status post coronary artery bypass grafting with subsequent improvement  in left ventricular systolic function from 40% to 91% on concurrent  medical therapy.  The patient is doing well without progressive  symptoms.  I encouraged him to continue his walking regimen.  He will  stay on his present dose of Lipitor and will plan a followup in 6 months  with a liver function and lipids.     Jonelle Sidle, MD  Electronically Signed   SGM/MedQ  DD: 05/19/2007  DT: 05/19/2007  Job #: [id number]

## 2010-05-26 NOTE — Assessment & Plan Note (Signed)
OFFICE VISIT   Camarena, Hao M  DOB:  04/28/1955                                        September 16, 2006  CHART #:  [id number]   CURRENT PROBLEMS:  1. Status post CABG x4 08/30/2006 for class 4 post-infarction angina      with three vessel disease and an EF of 40%.  2. Hypertension.  3. Recently reformed smoker.   PRESENT ILLNESS:  [name] is a 55 year old Asian male who presented with  unstable angina and positive cardiac enzymes. His EF was 40% and he had  severe three vessel disease. He underwent bypass grafts to his LAD, OM1,  OM2, and right coronary artery. He has done well following surgery and  continues to progress at home. He has had no recurrent angina, CHF, or  difficulty with his surgical incisions. He has been successful in  stopping smoking.   He remains on his hospital discharge medications as previously listed  including  1. Aspirin.  2. Lopressor 25 mg b.i.d.  3. Lisinopril 20 mg daily.  4. Lipitor 80 mg daily.  5. Oxycodone p.r.n.   PHYSICAL EXAMINATION:  VITAL SIGNS:  Blood pressure 114/60, pulse 65,  respirations 18, saturations 99%.  GENERAL:  He is alert and comfortable.  LUNGS:  His breath sounds are clear and equal.  CHEST:  The sternum is well healed.  CARDIAC:  Rhythm is regular and there is no rub or gallop.  EXTREMITIES:  His leg incisions are well healed and there is no  peripheral edema.   A PA and lateral chest x-ray shows clear lung fields without plural  effusion and a stable cardiac shadow.   IMPRESSION:  I told the patient that he could resume driving. He will be  able to return to work in early to mid-October as he has a fairly light  job. He knows not to lift more than 20 pounds until three months after  surgery which will be mid-November. He will continue his current  medications although he can stop the Imdur which was given because his  mammary artery was small and had a tendency to have some spasm in the  operating room. The danger for this now has passed. He will otherwise  return here as needed.   Kerin Perna, M.D.  Electronically Signed   PV/MEDQ  D:  09/16/2006  T:  09/17/2006  Job:  [id number]   cc:   Jonelle Sidle, MD

## 2010-05-26 NOTE — Discharge Summary (Signed)
NAMEJADIAN, KARMAN                     ACCOUNT NO.:  [id number]   MEDICAL RECORD NO.:  [id number]          PATIENT TYPE:  INP   LOCATION:  2017                         FACILITY:  [facility]   PHYSICIAN:  Kerin Perna, M.D.  DATE OF BIRTH:  28-Jan-1955   DATE OF ADMISSION:  08/25/2006  DATE OF DISCHARGE:  09/03/2006                               DISCHARGE SUMMARY   HISTORY OF PRESENT ILLNESS:  The patient is a 55 year old [place] male  with a reported history of hypertension that has only been treated for  the approximately last 2 weeks prior to admission.  He also has a  history of longstanding tobacco abuse.  He has a possible history of  hyperlipidemia as well but the information was somewhat limited.  He  states that he was referred for a routine physical to include pulmonary  function tests approximately 2 weeks ago at [facility].  Since that time  he reported having intermittent anterior chest pain described as  heartburn that would last approximately 10-15 seconds, but within the  last 24 hours prior to admission it would last more in the range of 5-10  minutes.  He denied any specific trigger and specifically denied this  with exertion and, in fact, his complaints are essentially all at rest.  He denied any cough and he denied any pleuritic nature to the pain.  He  had no fevers or chills.  He denied hemoptysis.  He became concerned and  returned back to [facility] on the date of admission.  An  electrocardiogram was obtained, which showed left ventricular  hypertrophy with inferolateral ST-T wave changes including approximately  0.5-1 mm ST-segment depression.  He was referred to the emergency  department, where he presented for further evaluation and treatment.  He  was seen by cardiology as well.  His cardiac markers initially were  abnormal showing a total CK of 237 with an MB band of 22.1 with a  relative index of 9.3 and a troponin I of 5.9.  It was cardiology's  opinion that he  should require further evaluation treatment and he was  admitted.   ALLERGIES:  No known drug allergies.   MEDICATIONS AT TIME OF PRESENTATION:  Lisinopril/hydrochlorothiazide  10/12.5 mg daily.   PAST MEDICAL HISTORY:  As outlined above.  He is also status post  internal sphincterotomy with treatment of rectal abscess in 2005 by Dr.  Carolynne Edouard.  He had problems with his back dating back to 2003 as well.  He  denied any other surgeries.   Family history, social history, review of systems and physical exam:  Please see the history and physical done at the time of admission.   HOSPITAL COURSE:  The patient was admitted with enzymatic evidence of a  non-ST elevation myocardial infarction with ST-segment changes outlined  above with stuttering/progressive symptoms in the last 2 weeks.  The  patient was admitted to the step-down unit, placed on aspirin, heparin,  low-dose beta blocker, and continuation of his lisinopril.  Lipid  studies were obtained and he was scheduled as well for  cardiac  catheterization.   HOSPITAL COURSE:  On August 26, 2006, he was taken to the cardiac  catheterization lab, where he was found to have three-vessel coronary  disease with moderate left ventricular systolic dysfunction with a left  ventricular ejection fraction of 40%.  No mitral regurgitation.  There  was also evidence of global hypokinesis.  Due to these findings surgical  revascularization was felt to be his best option.  Consultation was  obtained with Kerin Perna, M.D., who evaluated the patient and his  studies and agreed to proceed with surgery.   PROCEDURE:  On August 30, 2006, he was taken to the operating room, at  which time he underwent the following procedure:  Coronary bypass  grafting x4.  The following grafts were placed.   1. Left internal mammary artery to the LAD.  2. Sequential saphenous vein graft to obtuse marginal #1 and obtuse      marginal II.  3. Also a saphenous vein  graft to the right coronary artery.   The patient tolerated the procedure well and was taken to the surgical  intensive care unit in stable condition.   Postoperative hospital course:  The patient has overall done quite well.  He remained hemodynamically stable.  He remained neurologically intact.  He was weaned from the ventilator and inotropes without difficulty.  All  routine lines, monitors and drainage devices have been discontinued in a  standard fashion.  He does have a moderate postoperative anemia, most  recent hemoglobin and hematocrit 9 and 25.9 dated September 02, 2006.  Electrolytes, BUN and creatinine are within normal limits.  His  incisions are healing without evidence of infection.  He is tolerating  routine cardiac rehabilitation phase 1 modalities.  Postoperative day #3  he did have some mild fever.  A urinalysis is currently pending.  He is  responding slowly to pulmonary toilet and he has continued to work on  this.  Tentatively he is felt to be stable for discharge in the morning  of September 03, 2006, if he remains afebrile and results of his urinalysis  are unremarkable and he is otherwise stable.   INSTRUCTIONS:  The patient received written instructions regarding  medications, activity, diet, wound care and follow-up.  Follow-up will  be with Dr. Donata Clay on September 16, 2006, at 1:15 p.m. with a chest x-  ray from [facility].  Additionally, he is instructed  to see Dr. Simona Huh at [facility] in 2 weeks.  He is  instructed to call for this appointment.   MEDICATIONS ON DISCHARGE:  1. Aspirin 81 mg daily.  2. Nicotine patch p.r.n. as needed.  3. Imdur 15 mg daily.  4. Lopressor 25 mg twice daily.  5. Lisinopril 20 mg daily.  6. Lipitor 80 mg daily.  7. Oxycodone 5 mg one or two every 4-6 hours as needed,   INSTRUCTIONS:  The patient received written instructions regarding  medications, activity, diet, wound care and follow-up  up.   FINAL DIAGNOSES:  1. Severe three-vessel coronary artery disease in the setting of      myocardial infarction all as described above.  Ejection fraction of      40%.  2. Hypertension.  3. Active tobacco abuse.  4. History of four operations for perirectal abscess between September      2003 and March 2005.  5. Moderate postoperative anemia.      Rowe Clack, P.A.-C.      Theron Arista  Donata Clay, M.D.  Electronically Signed    WEG/MEDQ  D:  09/02/2006  T:  09/03/2006  Job:  [id number]   cc:   Jonelle Sidle, MD

## 2010-05-26 NOTE — H&P (Signed)
NAMEKARANVEER, RAMAKRISHNAN                     ACCOUNT NO.:  [id number]   MEDICAL RECORD NO.:  [id number]          PATIENT TYPE:  EMS   LOCATION:  MAJO                         FACILITY:  [facility]   PHYSICIAN:  Jonelle Sidle, MD DATE OF BIRTH:  10-30-1955   DATE OF ADMISSION:  08/25/2006  DATE OF DISCHARGE:                              HISTORY & PHYSICAL   PRIMARY CARE:  [facility].   REASON FOR PRESENTATION:  Chest pain and abnormal electrocardiogram.   HISTORY OF PRESENT ILLNESS:  Mr. Mullarkey is a 55 year old male with a  reported history of hypertension treated with medications for the last 2  weeks as well as a longstanding history of tobacco use.  He may also  have hyperlipidemia based on limited information.  He states that he was  referred for a routine physical which included pulmonary function tests  approximately 2 weeks ago at [facility].  Since that time he reports  having intermittent anterior chest pain described as heartburn that  began lasting only 10 to 15 seconds and within the last 24 hours has  been lasting anywhere from 5 to 10 minutes.  He notes no specific  trigger and specifically denies this with exertion, in fact having  essentially all his complaints at rest.  He has had no cough with this  and denies any pleuritic nature.  He has had no fevers or chills.  Denies any hemoptysis.  He became concerned and went back to [facility]  today.  An electrocardiogram was obtained which showed left ventricular  hypertrophy with inferolateral ST-T wave changes including approximately  1/2 to 1 mm ST segment depression.  He was referred subsequently to the  emergency department for further evaluation.   On my examination now he denies any significant chest pain.  His initial  cardiac markers are abnormal showing a total CK of 237, CK-MB of 22.1,  relative index of 9.3, and a troponin-I of 5.9.  Chest x-ray is pending.  He denies any prior cardiac risk stratification and no known  history of  cardiovascular disease or myocardial infarction.  I reviewed the  information with him and discussed the implications.   ALLERGIES:  No known drug allergies.   PRESENT MEDICATIONS:  Include lisinopril/HCTZ 10/12.5 mg p.o. daily.   FAMILY HISTORY:  Reviewed.  The patient denies any clear history of  premature cardiovascular disease in his family members.   SOCIAL HISTORY:  The patient is married.  He lives in [place].  He  has 3 children.  He has a longstanding tobacco use history of 1 pack per  day for approximately 30 years.  He works at [employer] doing [employer] work.   PAST MEDICAL HISTORY:  As outlined above.  He is also status post  previous internal sphincterotomy with treatment of rectal abscess in  2005 by Dr. Carolynne Edouard.  He had problems with this back in 2003 as well.  He  denies any other surgeries.   REVIEW OF SYSTEMS:  As described in the history of present illness.  He  denies any typical exertional chest pain or breathlessness.  No  orthopnea, PND, palpitations, or syncope.  No obvious bleeding  diathesis.  No lower extremity edema.   PHYSICAL EXAMINATION:  VITAL SIGNS:  Heart rate is in the 60s.  Blood  pressure 130/70.  Respirations 20, non-labored.  GENERAL:  The patient is comfortable and in no acute distress without  active chest pain.  HEENT:  Conjunctivae normal.  Oropharynx clear.  NECK:  Supple.  No elevated jugular venous pressure.  No bruits.  No  thyromegaly noted.  LUNGS:  Clear.  No labored breathing at rest.  CARDIAC:  Regular rate and rhythm.  Soft S4.  No S3 gallop or  pericardial rub.  No loud systolic murmur.  ABDOMEN:  Soft, nontender, normoactive bowel sounds.  EXTREMITIES:  No significant pedal edema.  Distal pulses are 2+.  SKIN:  Warm and dry.  MUSCULOSKELETAL:  No kyphosis is noted.  NEUROPSYCHIATRIC:  The patient is alert and oriented x3.   LABORATORY DATA:  WBC is 8.6.  Hemoglobin 13.8.  Hematocrit 41.9.  Platelets  297.  INR 0.8.  Sodium 139.  Potassium 4.2.  Chloride 103.  Bicarb 30.  Glucose 103.  BUN 16.  Creatinine 0.8.   IMPRESSION:  1. Enzymatic evidence of a non-ST elevation myocardial infarction with      ST segment changes as outlined and stuttering/progressive symptoms      for the last 2 weeks.  The patient is hemodynamically stable and      chest pain free at this point.  2. Hypertension, recently placed on lisinopril/HCTZ.  3. Possible hyperlipidemia based on limited information.  4. Ongoing tobacco use.   PLAN:  I have reviewed the situation with the patient.  He will be  admitted to the step-down unit.  We will begin aspirin, heparin, low-  dose beta blocker and continue lisinopril.  Statin therapy will be added  with followup fasting lipid profile.  I reviewed the risks and benefits  of a diagnostic cardiac catheterization, and he agrees to proceed.  This  will be scheduled for tomorrow to define the coronary anatomy and assess  re-vascularization options.  Further plans to follow.      Jonelle Sidle, MD  Electronically Signed     SGM/MEDQ  D:  08/25/2006  T:  08/26/2006  Job:  [id number] [id number]

## 2010-05-26 NOTE — Consult Note (Signed)
[name] [name], [name] [name]                     ACCOUNT NO.:  [id number]   MEDICAL RECORD NO.:  [id number]          PATIENT TYPE:  INP   LOCATION:  2923                         FACILITY:  [facility]   PHYSICIAN:  Kerin Perna, M.D.  DATE OF BIRTH:  1955-07-16   DATE OF CONSULTATION:  08/26/2006  DATE OF DISCHARGE:                                 CONSULTATION   REASON FOR CONSULTATION:  Severe three-vessel coronary disease with  unstable angina and subendocardial MI.   CHIEF COMPLAINT:  Chest pain.   HISTORY OF PRESENT ILLNESS:  I was asked to evaluate this 55 year old  [place] male smoker for possible surgical coronary revascularization  for recently diagnosed severe three-vessel coronary artery disease.  The  patient has had progressive symptoms of burning chest pain over the past  several days which increased in intensity and frequency leading to his  presentation at the emergency department.  He had ST-segment changes of  1 mm depression in the inferior and lateral leads.  His cardiac enzymes  were positive with an MB of 22 and a troponin of 5.9.  He was evaluated  by cardiology and given heparin and nitroglycerin with resolution of the  pain.  He subsequently underwent cardiac catheterization by Dr. Excell Seltzer  which demonstrated severe three-vessel coronary artery disease with EF  of 40%.  LVEDP was 18, and there was no evidence of aortic stenosis or  mitral regurgitation.  Based on his coronary anatomy, he is felt to be a  candidate for surgical revascularization.  He is currently stable on the  CCU on IV heparin.   PAST MEDICAL HISTORY:  1. Hypertension.  2. Active smoking.  3. Status post four operations for a perirectal abscess between      September 2003 and March 2005.   MEDICATIONS:  Lisinopril/hydrochlorothiazide 10/12.5 mg daily   ALLERGIES:  None   SOCIAL HISTORY:  The patient smokes a pack of cigarettes a day.  He is  married and lives in [place].  He works for a [employer].  He does not drink alcohol.   FAMILY HISTORY:  Negative for coronary artery disease or diabetes.   REVIEW OF SYSTEMS:  CONSTITUTIONAL:  Review is negative for weight  change or fever.  ENT:  Review is negative for dental symptoms or  difficulty swallowing. THORACIC:  Review is negative chest trauma or  abnormal chest x-ray.  He does smoke, but he has had no symptoms of URI  recently.  GI:  Review is negative for hepatitis, jaundice or blood per  rectum.  NEUROLOGIC:  Review is negative for kidney stones.  ENDOCRINE:  Review is negative.  Diabetes.  VASCULAR:  Review is negative DVT,  claudication or TIA.  NEUROLOGIC:  Review is negative stroke or seizure.   PHYSICAL EXAMINATION:  VITAL SIGNS:  The patient is 5 feet 5 inches and  weighs 140 pounds. Blood pressure 130/70, pulse 70 and regular in sinus.  GENERAL APPEARANCE:  That of a pleasant Asian male in no distress.  HEENT:  Exam is  normocephalic.  NECK:  Supple without JVD, mass or carotid bruit.  LYMPHATICS:  Show no palpable supraclavicular or cervical adenopathy.  LUNGS:  Breath sounds are clear, and there is no thoracic deformity.  CARDIAC:  Exam is regular rhythm without S3, gallop or murmur.  ABDOMEN:  Exam is soft, nontender without pulsatile mass.  EXTREMITIES:  Reveal no clubbing, cyanosis or edema.  VASCULAR:  Exam reveals 2+ pulses in all extremities with no evidence of  venous insufficiency of the lower extremities.  NEUROLOGIC:  Exam is alert, oriented, nonfocal.   LABORATORY DATA:  I reviewed the cardiac catheterization  performed by  Dr. Excell Seltzer and agree with interpretation of severe three-vessel disease  with a mild to moderate reduction in LV function.   His chest x-ray shows no acute disease but some evidence of COPD.   His cardiac enzymes are mildly elevated.   IMPRESSION AND PLAN:  The patient has post infarction unstable angina  after a recent non-Q-wave subendocardial  myocardial infarction.  He has  three-vessel disease and would benefit from surgical revascularization.  This will be scheduled for Tuesday, August 19.  The plan for surgery and  details of the procedure were discussed with the patient and family.  They understand and agree.   Thank you for the consultation.      Kerin Perna, M.D.  Electronically Signed     PV/MEDQ  D:  08/27/2006  T:  08/28/2006  Job:  [id number]

## 2010-05-26 NOTE — Assessment & Plan Note (Signed)
[facility]                            CARDIOLOGY OFFICE NOTE   NAME:[name] [name], [name] [name]                            MRN:          [id number]  DATE:12/29/2007                            DOB:          [birth year]    PRIMARY CARE PHYSICIAN:  The patient does not have a primary care  physician.   HISTORY OF PRESENT ILLNESS:  This is a 55 year old with a history of  coronary artery disease status post coronary artery bypass grafting who  presents to Cardiology Clinic for followup of his heart disease.  In  general, the patient has been doing well lately.  When he saw Dr.  Diona Browner last back in July, he described a feeling of pulling in his  chest when he lifts a heavy load.  This pulling sensation would tend to  last for a couple of hours after lifting, and he would be able to  reproduce it with palpation of his chest.  He did actually have an  adenosine Myoview in July 2009 because of these symptoms and he was  found to have a normal perfusion and it was overall a normal study.  The  patient does continue to have the same pulling sensation in his chest  when lifting; however, he is able to walk as far he wants and climb  stairs without any chest pain with exertion.  He has no dyspnea on  exertion.  He is fairly active.  He is a Education administrator.  His blood pressure is  elevated today at 170/81.   PAST MEDICAL HISTORY:  1. Coronary artery disease.  The patient presented in August 2008 with      acute coronary syndrome.  He did have a bypass surgery.  He did      have 3-vessel disease on heart catheterization and had bypass      surgery with LIMA to the LAD, sequential vein graft to the obtuse      marginal-1 and obtuse marginal-2, and vein graft to the RCA.  He      did have an adenosine Myoview in July 2009 with an EF of 69% and      normal perfusion.  This was a normal stress test.  2. Echocardiogram, November 2008, EF 65-70%.  No regional wall motion  abnormalities.  Trivial mitral regurgitation.  3. Gastroesophageal reflux disease.  4. Hypertension.  5. Hyperlipidemia.  6. History of tobacco abuse.  The patient quit smoking in 2008.   SOCIAL HISTORY:  The patient no longer smokes.  He quit in 2008.  He  drinks approximately 2 beers a week.  He is a Education administrator for a living.  The  patient is originally from [place].   MEDICATIONS:  1. Lisinopril 20 mg daily.  2. Lipitor 80 mg daily.  3. Metoprolol 25 mg b.i.d.  4. Omeprazole 20 mg daily.  5. Aspirin 81 mg daily.   Most recent labs, December 2009, LFTs are normal, LDL 62, HDL 42,  triglycerides 168.   PHYSICAL EXAMINATION:  VITAL SIGNS:  Blood pressure is 170/81, heart  rate  is 63 and regular, weight 159 pounds.  GENERAL:  This is a well-developed male in no apparent distress.  NEUROLOGIC:  Alert and oriented x3.  Normal affect.  NECK:  JVP is 8 cm of water.  There is no thyromegaly or thyroid nodule.  LUNGS:  Clear to auscultation bilaterally with normal respiratory  effort.  CARDIOVASCULAR:  Heart regular S1 and S2.  No S3.  There is a  soft S4.  No murmur.  Posterior tibial pulses 2+ bilaterally.  No edema.  No peripheral edema.  There is no carotid bruit.  ABDOMEN:  Soft, nontender.  No hepatosplenomegaly.  EXTREMITIES:  No clubbing or cyanosis.   ASSESSMENT AND PLAN:  This is a 55 year old with history of coronary  artery disease status post coronary artery bypass grafting,  hypertension, and hyperlipidemia, who presents to Cardiology Clinic for  followup.  1. Coronary artery disease.  The patient does appear stable without      any significant ischemic symptoms.  I do think the pulling the      patient feels in his chest when he lifts something is likely due to      some scar tissue in his chest.  He did have a recent Myoview that      showed no evidence of ischemia or infarction.  He will continue on      his aspirin, ACE inhibitor, beta-blocker, and statin.  2.  Hyperlipidemia.  The patient's lipids were under excellent control      in December 2009.  We will continue him on his current dose of      statin.  I did ask him to start taking fish oil 2 g a day.  3. Hypertension.  The patient's blood pressure is running high today.      We will increase his lisinopril to 40 mg daily.  We will have him      back in 2 weeks for a blood pressure check, and we will check a      Chem-7 at that same time as we are increasing his lisinopril.  4. We will refer the patient to a primary care physician.     Marca Ancona, MD  Electronically Signed    DM/MedQ  DD: 12/29/2007  DT: 12/30/2007  Job #: [id number]

## 2010-05-29 NOTE — Op Note (Signed)
   NAMECARRELL, RAHMANI                               ACCOUNT NO.:  [id number]   MEDICAL RECORD NO.:  [id number]                   PATIENT TYPE:  AMB   LOCATION:  DAY                                  FACILITY:  [facility]   PHYSICIAN:  Ollen Gross. Vernell Morgans, M.D.              DATE OF BIRTH:  04-Sep-1955   DATE OF PROCEDURE:  01/12/2002  DATE OF DISCHARGE:                                 OPERATIVE REPORT   PREOPERATIVE DIAGNOSIS:  Persistent perirectal abscess.   POSTOPERATIVE DIAGNOSIS:  Persistent perirectal abscess.   PROCEDURE:  I&D of perirectal abscess.   SURGEON:  Ollen Gross. Carolynne Edouard, M.D.   ANESTHESIA:  General via LMA.   DESCRIPTION OF PROCEDURE:  After informed consent was obtained, the patient  was brought to the operating room and placed in a supine position on the  operating room table.  After adequate induction of general anesthesia, the  patient was placed in lithotomy position, and his perirectal area was  prepped with Betadine and draped in the usual sterile manner.  He had two  areas of significance along his left perirectal area.  The small area  situated more posterior was opened with a 15 blade knife.  The cavity was  shallow and self-contained.  The cavity was curetted, and hemostasis was  achieved using the Bovie electrocautery, and the cavity was packed with  gauze.  The anterior area of concern was cored out superficially with the 15  blade knife.  The area had a sinus tract that went deep above the sphincter  muscles but did not appear to communicate with the rectum.,  Again, this  cavity was curetted, and hemostasis was achieved with the electrocautery.  A  quarter-inch Penrose  was placed deep into this tract and anchored to the  skin with a 3-0 nylon stitch.  On examination using the anoscope, no  communications with the rectum or anal canal could be identified.  Sterile  dressings were then applied.  The patient tolerated the procedure well.  At  the end of the case, all  needle, sponge, and instrument counts were correct.  During the procedure, I asked Timothy E. Earlene Plater, M.D. to also render an  opinion and examine the patient. and he agreed with the above treatment  plan.  The patient was then awakened and taken to the recovery room in  stable condition.                                               Ollen Gross. Vernell Morgans, M.D.    PST/MEDQ  D:  01/12/2002  T:  01/12/2002  Job:  [id number]
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
## 2010-05-29 NOTE — Op Note (Signed)
   NAMECOLBEY, WIRTANEN                               ACCOUNT NO.:  [id number]   MEDICAL RECORD NO.:  [id number]                   PATIENT TYPE:  OBV   LOCATION:  0456                                 FACILITY:  [facility]   PHYSICIAN:  Ollen Gross. Vernell Morgans, M.D.              DATE OF BIRTH:  Jun 06, 1955   DATE OF PROCEDURE:  04/23/2002  DATE OF DISCHARGE:  04/24/2002                                 OPERATIVE REPORT   PREOPERATIVE DIAGNOSIS:  Persistent perirectal abscess.   POSTOPERATIVE DIAGNOSIS:  Persistent perirectal abscess.   PROCEDURES:  1. Examination under anesthesia.  2. Endorectal ultrasound.  3. Incision and drainage of perirectal abscess and placement of a seton.   SURGEON:  Ollen Gross. Carolynne Edouard, M.D.   ASSISTANT:  Sheppard Plumber. Earlene Plater, M.D.   ANESTHESIA:  General via LMA.   DESCRIPTION OF PROCEDURE:  After informed consent was obtained, the patient  was brought to the operating room and placed in the supine position on the  operating table.  After placement of general anesthesia, the patient was  placed in the lithotomy position and his perirectal area was prepped with  Betadine and draped in the usual sterile manner.  An endorectal ultrasound  was performed that showed the evidence for the persistent tract in question,  which seemed to communicate in a horseshoe-type fashion around the rectum.  The fistula tract was probed with a narrow probe, and there was evidence  that this fistula tract now communicated with the rectum and we were able to  demonstrate this with the probe.  The left perirectal area had several small  spots that were draining, and these were all opened up in continuity and the  area was widely opened, the tracts were curetted, and then hemostasis was  achieved using the electrocautery.  A hemostat was able to be placed through  the fistula tract and the doubled vessel loop was able to be passed through  the fistula tract and anchored externally by snugging the vessel  loop up and  tying it with an 0 silk tie.  The opened abscess cavity was passed with  gauze and the areas were all infiltrated with 0.25% Marcaine with  epinephrine.  Sterile dressings were then applied.  The patient tolerated  the procedure well.  At the end of the case all needle, sponge, and  instrument counts were correct.  The patient was then awakened and taken to  the recovery room in stable condition.                                               Ollen Gross. Vernell Morgans, M.D.    PST/MEDQ  D:  04/25/2002  T:  04/25/2002  Job:  [id number]
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
## 2010-06-11 ENCOUNTER — Encounter: Payer: Self-pay | Admitting: Internal Medicine

## 2010-06-18 ENCOUNTER — Encounter: Payer: BC Managed Care – PPO | Admitting: *Deleted

## 2010-06-23 ENCOUNTER — Encounter: Payer: Self-pay | Admitting: *Deleted

## 2010-06-25 ENCOUNTER — Ambulatory Visit (INDEPENDENT_AMBULATORY_CARE_PROVIDER_SITE_OTHER): Payer: BC Managed Care – PPO | Admitting: Internal Medicine

## 2010-06-25 ENCOUNTER — Encounter: Payer: Self-pay | Admitting: Internal Medicine

## 2010-06-25 VITALS — BP 136/72 | HR 87 | Resp 18 | Ht 64.0 in | Wt 165.0 lb

## 2010-06-25 DIAGNOSIS — E785 Hyperlipidemia, unspecified: Secondary | ICD-10-CM

## 2010-06-25 DIAGNOSIS — I10 Essential (primary) hypertension: Secondary | ICD-10-CM

## 2010-06-25 DIAGNOSIS — I2510 Atherosclerotic heart disease of native coronary artery without angina pectoris: Secondary | ICD-10-CM

## 2010-06-25 MED ORDER — AMLODIPINE BESYLATE 5 MG PO TABS: 2.5000 mg | ORAL_TABLET | Freq: Every day | ORAL | Status: DC

## 2010-06-25 MED ORDER — AMLODIPINE BESYLATE 5 MG PO TABS: 5.0000 mg | ORAL_TABLET | Freq: Every day | ORAL | Status: DC

## 2010-06-25 NOTE — Patient Instructions (Signed)
Your physician recommends that you schedule a follow-up appointment in: 6 MONTHS WITH DR [facility]  Your physician has recommended you make the following change in your medication: ADD AMLODIPINE 2.5 MG  BEFORE BED TIME

## 2010-06-25 NOTE — Assessment & Plan Note (Addendum)
Followed by Dr. Jonny Ruiz. Consider increasing fish oil for hyperTGs. Also diet and exercise.

## 2010-06-25 NOTE — Progress Notes (Signed)
HPI:  [name] is a 55 yo with male with history of CAD s/p CABG and bradycardia s/p Medtronic pacemaker presents for cardiology followup. He typically is followed by Dr. Shirlee Latch. He was schedule with me inadvertently.   He says he has been doing well.  He is working full time as a Librarian, academic.  No exertional dyspnea or chest pain. Still with some incisional soreness.  No palpitations or lightheadedness. Takes BP every morning before he takes his medicine and it is usually around 150. Rechecks in afternoon and SBP usually around 120.   Wife says when he is tired he snores a lot. Denies daytime fatigue. Doesn't fall asleep while driving.   Dr. Jonny Ruiz following lipids.   ROS: All systems negative except as listed in HPI, PMH and Problem List.  Past Medical History  Diagnosis Date  . CAD (coronary artery disease)     The patient presented in Aug 2008 with acute cornary syndrome. He did have a bypass surgeru. He did have a 3-vessel disease on heart cathiterization and had bypass surgery with LIMA to the LAD, sequential vein graft to the obtuse marginal - 1 and obtuse marginal -2, and vein graft to the RCA. Adenosine myoview (11/10): EF 64%, normal wall motion, normal perfusion.   [name] History of echocardiogram     November 2008. EF 65-70%. no regional wall abnormalities. Trivial mitral regurgitation.  [name] GERD (gastroesophageal reflux disease)   . HTN (hypertension)   . Hyperlipidemia   . Sick sinus syndrome     with presyncope. medtronic dual chamber PCM placed 11/10  . Type II or unspecified type diabetes mellitus without mention of complication, not stated as uncontrolled   . Mild anemia     Hx of post op. 8/08  . Paroxysmal atrial fibrillation     only episode noted breifly during hospitalization for PCM placement 11/10.     Current Outpatient Prescriptions  Medication Sig Dispense Refill  . acetaminophen (TYLENOL) 325 MG tablet Take 650 mg by mouth every 6 (six) hours as needed.        [name]  aspirin 325 MG tablet Take 325 mg by mouth daily.        [name] atorvastatin (LIPITOR) 80 MG tablet Take 80 mg by mouth daily.        [name] lisinopril (PRINIVIL,ZESTRIL) 40 MG tablet Take 20 mg by mouth 2 (two) times daily.        . metoprolol (TOPROL-XL) 100 MG 24 hr tablet Take 100 mg by mouth daily.        . Omega-3 Fatty Acids (OMEGA-3 FISH OIL) 1200 MG CAPS Take by mouth 2 (two) times daily.        [name] omeprazole (PRILOSEC) 20 MG capsule Take 40 mg by mouth daily.        [name] DISCONTD: mupirocin (BACTROBAN) 2 % ointment Apply topically 3 (three) times daily. Use for 1 week          PHYSICAL EXAM: Filed Vitals:   06/25/10 1057  BP: 147/90  Pulse: 87  Resp: 18   General:  Well appearing. No resp difficulty HEENT: normal Neck: supple. JVP flat. Carotids 2+ bilaterally; no bruits. No lymphadenopathy or thryomegaly appreciated. Cor: PMI normal. Regular rate & rhythm. No rubs, gallops or murmurs. Lungs: clear Abdomen: soft, nontender, nondistended. No hepatosplenomegaly. No bruits or masses. Good bowel sounds. Extremities: no cyanosis, clubbing, rash, edema Neuro: alert & orientedx3, cranial nerves grossly intact. Moves all 4 extremities w/o difficulty. Affect pleasant.  ECG:  A paced 64 minimal ST scooping    ASSESSMENT & PLAN:

## 2010-06-25 NOTE — Assessment & Plan Note (Addendum)
Morning BPs consistently elevated. Add amlodipine 2.5 at bedtime. I suspect he may have OSA but doesn't seem severe. May need referral for sleep study at some point.

## 2010-06-25 NOTE — Assessment & Plan Note (Signed)
No evidence of ischemia. Continue current regimen.   

## 2010-08-27 ENCOUNTER — Other Ambulatory Visit: Payer: Self-pay | Admitting: Internal Medicine

## 2010-08-27 DIAGNOSIS — IMO0001 Reserved for inherently not codable concepts without codable children: Secondary | ICD-10-CM

## 2010-08-27 DIAGNOSIS — E785 Hyperlipidemia, unspecified: Secondary | ICD-10-CM

## 2010-08-28 ENCOUNTER — Other Ambulatory Visit (INDEPENDENT_AMBULATORY_CARE_PROVIDER_SITE_OTHER): Payer: BC Managed Care – PPO

## 2010-08-28 ENCOUNTER — Other Ambulatory Visit: Payer: BC Managed Care – PPO

## 2010-08-28 DIAGNOSIS — E785 Hyperlipidemia, unspecified: Secondary | ICD-10-CM

## 2010-08-28 DIAGNOSIS — IMO0001 Reserved for inherently not codable concepts without codable children: Secondary | ICD-10-CM

## 2010-08-28 LAB — LIPID PANEL
Cholesterol: 127 mg/dL (ref 0–200)
HDL: 63.8 mg/dL (ref >39.00)
LDL Cholesterol: 36 mg/dL (ref 0–99)
Total CHOL/HDL Ratio: 2
Triglycerides: 138 mg/dL (ref 0.0–149.0)
VLDL: 27.6 mg/dL (ref 0.0–40.0)

## 2010-08-28 LAB — BASIC METABOLIC PANEL
BUN: 19 mg/dL (ref 6–23)
CO2: 26 mEq/L (ref 19–32)
Calcium: 9.6 mg/dL (ref 8.4–10.5)
Chloride: 108 mEq/L (ref 96–112)
Creatinine, Ser: 1 mg/dL (ref 0.4–1.5)
GFR: 80.14 mL/min (ref >60.00)
Glucose, Bld: 150 mg/dL — ABNORMAL HIGH (ref 70–99)
Potassium: 4.7 mEq/L (ref 3.5–5.1)
Sodium: 141 mEq/L (ref 135–145)

## 2010-08-28 LAB — HEMOGLOBIN A1C: Hgb A1c MFr Bld: 6.6 % — ABNORMAL HIGH (ref 4.6–6.5)

## 2010-08-30 ENCOUNTER — Encounter: Payer: Self-pay | Admitting: Internal Medicine

## 2010-08-30 DIAGNOSIS — Z0001 Encounter for general adult medical examination with abnormal findings: Secondary | ICD-10-CM | POA: Insufficient documentation

## 2010-08-30 DIAGNOSIS — Z0000 Encounter for general adult medical examination without abnormal findings: Secondary | ICD-10-CM | POA: Insufficient documentation

## 2010-08-31 ENCOUNTER — Other Ambulatory Visit: Payer: Self-pay | Admitting: Cardiology

## 2010-09-04 ENCOUNTER — Ambulatory Visit (INDEPENDENT_AMBULATORY_CARE_PROVIDER_SITE_OTHER): Payer: BC Managed Care – PPO | Admitting: Internal Medicine

## 2010-09-04 ENCOUNTER — Encounter: Payer: Self-pay | Admitting: Internal Medicine

## 2010-09-04 DIAGNOSIS — Z0000 Encounter for general adult medical examination without abnormal findings: Secondary | ICD-10-CM

## 2010-09-04 DIAGNOSIS — E785 Hyperlipidemia, unspecified: Secondary | ICD-10-CM

## 2010-09-04 DIAGNOSIS — M25519 Pain in unspecified shoulder: Secondary | ICD-10-CM

## 2010-09-04 DIAGNOSIS — M25511 Pain in right shoulder: Secondary | ICD-10-CM | POA: Insufficient documentation

## 2010-09-04 DIAGNOSIS — E119 Type 2 diabetes mellitus without complications: Secondary | ICD-10-CM

## 2010-09-04 DIAGNOSIS — I10 Essential (primary) hypertension: Secondary | ICD-10-CM

## 2010-09-04 MED ORDER — ATORVASTATIN CALCIUM 40 MG PO TABS: 40.0000 mg | ORAL_TABLET | Freq: Every day | ORAL | Status: DC

## 2010-09-04 MED ORDER — METOPROLOL TARTRATE 50 MG PO TABS: 50.0000 mg | ORAL_TABLET | Freq: Two times a day (BID) | ORAL | Status: DC

## 2010-09-04 NOTE — Patient Instructions (Addendum)
Decrease the lipitor generic to 40 mg per day (you can take half of the 80 mg you have until finished, then start the new prescription) Please stop taking the metoprolol ER 100 mg that you take at night You DO want to continue the metoprolol 50 mg twice per day  You are given the new prescriptions today (the liptor and metoprolo 50 mg twice per day prescriptions were sent to the pharmacy) Please have the pharmacy call if you need other refills You can also take tylenol 8 hr as needed for the right shoulder pain;  Please call if you feel you would like to see orthopedic for this Continue all other medications as before Please keep your appointments with your specialists as you have planned - cardiology as planned Please return in 6 mo with Lab testing done 3-5 days before

## 2010-09-04 NOTE — Assessment & Plan Note (Signed)
Has really been working on lower chol diet,  Now with LDL 36 - ok to decrease the lipitor 80 to 40 mg; f/u next visit  Lab Results  Component Value Date   LDLCALC 36 08/28/2010

## 2010-09-04 NOTE — Progress Notes (Signed)
Subjective:    Patient ID: [name] [name], male    DOB: 21-Apr-1955, 55 y.o.   MRN: [id number]  HPI  Here to f/u; overall doing ok,  Pt denies chest pain, increased sob or doe, wheezing, orthopnea, PND, increased [place] swelling, palpitations, dizziness or syncope.  Pt denies new neurological symptoms such as new headache, or facial or extremity weakness or numbness   Pt denies polydipsia, polyuria, or low sugar symptoms such as weakness or confusion improved with po intake.  Pt states overall good compliance with meds, trying to follow lower cholesterol, diabetic diet, wt overall stable but little exercise however.  Does have some mild right shoulder pain and slight tenderness to the post aspect without swelling, neck or other more distal arm pain, or decreased ROM, tylenol helps ok, ongoing intermittent for 2 mo.  Also somewhat confused about his meds in that he found a toprol xl 100 mg 1 per day bottle at home, as well as his metoprolol 50 qd bottle, so he "split the difference" and has been taking metoprolol 50 qam, as well as the toprol xl 100 qhs. Past Medical History  Diagnosis Date  . CAD (coronary artery disease)     The patient presented in Aug 2008 with acute cornary syndrome. He did have a bypass surgeru. He did have a 3-vessel disease on heart cathiterization and had bypass surgery with LIMA to the LAD, sequential vein graft to the obtuse marginal - 1 and obtuse marginal -2, and vein graft to the RCA. Adenosine myoview (11/10): EF 64%, normal wall motion, normal perfusion.   [name] History of echocardiogram     November 2008. EF 65-70%. no regional wall abnormalities. Trivial mitral regurgitation.  [name] GERD (gastroesophageal reflux disease)   . HTN (hypertension)   . Hyperlipidemia   . Sick sinus syndrome     with presyncope. medtronic dual chamber PCM placed 11/10  . Type II or unspecified type diabetes mellitus without mention of complication, not stated as uncontrolled   . Mild anemia     Hx of post  op. 8/08  . Paroxysmal atrial fibrillation     only episode noted breifly during hospitalization for PCM placement 11/10.    Past Surgical History  Procedure Date  . Coronary artery bypass graft   . Rectal abcess     2005. Dr. Carolynne Edouard with interanal sphincterotomy  . Phrenic nerve pacemaker implantation     reports that he has quit smoking. He does not have any smokeless tobacco history on file. He reports that he drinks alcohol. He reports that he does not use illicit drugs. family history is not on file. No Known Allergies Current Outpatient Prescriptions on File Prior to Visit  Medication Sig Dispense Refill  . amLODipine (NORVASC) 5 MG tablet Take 0.5 tablets (2.5 mg total) by mouth daily.  30 tablet  11  . aspirin 325 MG tablet Take 325 mg by mouth daily.        [name] atorvastatin (LIPITOR) 80 MG tablet Take 80 mg by mouth daily.        [name] lisinopril (PRINIVIL,ZESTRIL) 40 MG tablet Take 20 mg by mouth 2 (two) times daily.        . metoprolol (LOPRESSOR) 50 MG tablet TAKE 1 TABLET BY MOUTH TWICE DAILY  60 tablet  3  . metoprolol (TOPROL-XL) 100 MG 24 hr tablet Take 100 mg by mouth daily.        . Omega-3 Fatty Acids (OMEGA-3 FISH OIL) 1200 MG CAPS  Take by mouth 2 (two) times daily.        [name] omeprazole (PRILOSEC) 20 MG capsule Take 40 mg by mouth daily.        [name] acetaminophen (TYLENOL) 325 MG tablet Take 650 mg by mouth every 6 (six) hours as needed.         Review of Systems Review of Systems  Constitutional: Negative for diaphoresis and unexpected weight change.  HENT: Negative for drooling and tinnitus.   Eyes: Negative for photophobia and visual disturbance.  Respiratory: Negative for choking and stridor.   Gastrointestinal: Negative for vomiting and blood in stool.  Genitourinary: Negative for hematuria and decreased urine volume.       Objective:   Physical Exam BP 140/80  Pulse 74  Temp(Src) 98.2 F (36.8 C) (Oral)  Ht 5\' 4"  (1.626 m)  Wt 165 lb 4 oz (74.957 kg)  BMI  28.37 kg/m2  SpO2 98% Physical Exam  VS noted Constitutional: Pt appears well-developed and well-nourished.  HENT: Head: Normocephalic.  Right Ear: External ear normal.  Left Ear: External ear normal.  Eyes: Conjunctivae and EOM are normal. Pupils are equal, round, and reactive to light.  Neck: Normal range of motion. Neck supple.  Cardiovascular: Normal rate and regular rhythm.   Pulmonary/Chest: Effort normal and breath sounds normal.  Abd:  Soft, NT, non-distended, + BS Neurological: Pt is alert. No cranial nerve deficit.  Skin: Skin is warm. No erythema.  Psychiatric: Pt behavior is normal. Thought content normal.  Right shoulder NT, no rash, swelling, and has FROM       Assessment & Plan:

## 2010-09-04 NOTE — Assessment & Plan Note (Signed)
As per HPI, has some confusion about his metoprolol prescriptions  - for now should take the metoprolol 50  Bid only (d/c the toprol xl 100 qhs he has also been taking), and follow closely, HR today well controlled, has f/u with cardiology dec 2012 as well

## 2010-09-04 NOTE — Assessment & Plan Note (Signed)
63mo persistent, mild, exam benign today - suspect low grade rotater cuff tendonitis, to avoid nsaids with hx of CV dz, for tylenol as needed

## 2010-09-04 NOTE — Assessment & Plan Note (Signed)
Excellent control with diet only   - Continue all other medications as before    Lab Results  Component Value Date   HGBA1C 6.6* 08/28/2010

## 2010-09-19 ENCOUNTER — Other Ambulatory Visit: Payer: Self-pay | Admitting: Internal Medicine

## 2010-09-29 ENCOUNTER — Other Ambulatory Visit: Payer: Self-pay | Admitting: Internal Medicine

## 2010-10-23 LAB — BASIC METABOLIC PANEL
BUN: 12
BUN: 13
BUN: 7
BUN: 8
BUN: 8
BUN: 8
BUN: 9
CO2: 26
CO2: 26
CO2: 26
CO2: 27
CO2: 28
CO2: 28
CO2: 29
Calcium: 8.5
Calcium: 8.6
Calcium: 8.8
Calcium: 8.9
Calcium: 9
Calcium: 9.3
Calcium: 9.5
Chloride: 102
Chloride: 104
Chloride: 105
Chloride: 106
Chloride: 108
Chloride: 108
Chloride: 109
Creatinine, Ser: 0.82
Creatinine, Ser: 0.84
Creatinine, Ser: 0.91
Creatinine, Ser: 0.91
Creatinine, Ser: 0.94
Creatinine, Ser: 0.97
Creatinine, Ser: 0.99
GFR calc Af Amer: >60
GFR calc Af Amer: >60
GFR calc Af Amer: >60
GFR calc Af Amer: >60
GFR calc Af Amer: >60
GFR calc Af Amer: >60
GFR calc Af Amer: >60
GFR calc non Af Amer: >60
GFR calc non Af Amer: >60
GFR calc non Af Amer: >60
GFR calc non Af Amer: >60
GFR calc non Af Amer: >60
GFR calc non Af Amer: >60
GFR calc non Af Amer: >60
Glucose, Bld: 104 — ABNORMAL HIGH
Glucose, Bld: 116 — ABNORMAL HIGH
Glucose, Bld: 122 — ABNORMAL HIGH
Glucose, Bld: 129 — ABNORMAL HIGH
Glucose, Bld: 177 — ABNORMAL HIGH
Glucose, Bld: 89
Glucose, Bld: 96
Potassium: 3.5
Potassium: 3.6
Potassium: 3.7
Potassium: 3.7
Potassium: 4
Potassium: 4.1
Potassium: 4.4
Sodium: 137
Sodium: 138
Sodium: 138
Sodium: 139
Sodium: 139
Sodium: 140
Sodium: 141

## 2010-10-23 LAB — CBC
HCT: 25.7 — ABNORMAL LOW
HCT: 25.9 — ABNORMAL LOW
HCT: 28.2 — ABNORMAL LOW
HCT: 28.7 — ABNORMAL LOW
HCT: 29.9 — ABNORMAL LOW
HCT: 32.2 — ABNORMAL LOW
HCT: 32.9 — ABNORMAL LOW
HCT: 37.3 — ABNORMAL LOW
HCT: 37.4 — ABNORMAL LOW
HCT: 37.5 — ABNORMAL LOW
HCT: 38.4 — ABNORMAL LOW
Hemoglobin: 10.5 — ABNORMAL LOW
Hemoglobin: 11.1 — ABNORMAL LOW
Hemoglobin: 11.4 — ABNORMAL LOW
Hemoglobin: 12.9 — ABNORMAL LOW
Hemoglobin: 12.9 — ABNORMAL LOW
Hemoglobin: 13
Hemoglobin: 13.2
Hemoglobin: 8.9 — ABNORMAL LOW
Hemoglobin: 9 — ABNORMAL LOW
Hemoglobin: 9.8 — ABNORMAL LOW
Hemoglobin: 9.9 — ABNORMAL LOW
MCHC: 34.3
MCHC: 34.4
MCHC: 34.5
MCHC: 34.6
MCHC: 34.6
MCHC: 34.6
MCHC: 34.7
MCHC: 34.7
MCHC: 34.7
MCHC: 34.8
MCHC: 35.2
MCV: 93.1
MCV: 93.5
MCV: 93.6
MCV: 93.9
MCV: 93.9
MCV: 94
MCV: 94.3
MCV: 94.8
MCV: 94.8
MCV: 95.1
MCV: 95.4
Platelets: 189
Platelets: 194
Platelets: 198
Platelets: 199
Platelets: 202
Platelets: 208
Platelets: 256
Platelets: 285
Platelets: 285
Platelets: 286
Platelets: 287
RBC: 2.71 — ABNORMAL LOW
RBC: 2.73 — ABNORMAL LOW
RBC: 3.02 — ABNORMAL LOW
RBC: 3.03 — ABNORMAL LOW
RBC: 3.2 — ABNORMAL LOW
RBC: 3.42 — ABNORMAL LOW
RBC: 3.5 — ABNORMAL LOW
RBC: 3.98 — ABNORMAL LOW
RBC: 3.98 — ABNORMAL LOW
RBC: 3.99 — ABNORMAL LOW
RBC: 4.03 — ABNORMAL LOW
RDW: 11.8
RDW: 11.8
RDW: 11.8
RDW: 11.9
RDW: 11.9
RDW: 12
RDW: 12
RDW: 12.1
RDW: 12.1
RDW: 12.3
RDW: 12.3
WBC: 10.8 — ABNORMAL HIGH
WBC: 11.8 — ABNORMAL HIGH
WBC: 11.9 — ABNORMAL HIGH
WBC: 14.2 — ABNORMAL HIGH
WBC: 14.4 — ABNORMAL HIGH
WBC: 14.7 — ABNORMAL HIGH
WBC: 16.1 — ABNORMAL HIGH
WBC: 17.7 — ABNORMAL HIGH
WBC: 8.8
WBC: 9
WBC: 9.1

## 2010-10-23 LAB — CROSSMATCH
ABO/RH(D): AB POS
Antibody Screen: NEGATIVE

## 2010-10-23 LAB — HEPARIN LEVEL (UNFRACTIONATED)
Heparin Unfractionated: 0.26 — ABNORMAL LOW
Heparin Unfractionated: 0.4
Heparin Unfractionated: 0.42
Heparin Unfractionated: 0.44
Heparin Unfractionated: 0.5
Heparin Unfractionated: 0.64

## 2010-10-23 LAB — POCT I-STAT 4, (NA,K, GLUC, HGB,HCT)
Glucose, Bld: 104 — ABNORMAL HIGH
Glucose, Bld: 109 — ABNORMAL HIGH
Glucose, Bld: 112 — ABNORMAL HIGH
Glucose, Bld: 117 — ABNORMAL HIGH
Glucose, Bld: 118 — ABNORMAL HIGH
Glucose, Bld: 85
HCT: 21 — ABNORMAL LOW
HCT: 24 — ABNORMAL LOW
HCT: 25 — ABNORMAL LOW
HCT: 32 — ABNORMAL LOW
HCT: 35 — ABNORMAL LOW
HCT: 38 — ABNORMAL LOW
Hemoglobin: 10.9 — ABNORMAL LOW
Hemoglobin: 11.9 — ABNORMAL LOW
Hemoglobin: 12.9 — ABNORMAL LOW
Hemoglobin: 7.1 — CL
Hemoglobin: 8.2 — ABNORMAL LOW
Hemoglobin: 8.5 — ABNORMAL LOW
Operator id: 252761
Operator id: 3342
Operator id: 3342
Operator id: 3342
Operator id: 3342
Operator id: 3342
Potassium: 3.6
Potassium: 3.8
Potassium: 3.9
Potassium: 4.1
Potassium: 4.1
Potassium: 5.4 — ABNORMAL HIGH
Sodium: 133 — ABNORMAL LOW
Sodium: 134 — ABNORMAL LOW
Sodium: 135
Sodium: 136
Sodium: 136
Sodium: 139

## 2010-10-23 LAB — I-STAT 8, (EC8 V) (CONVERTED LAB)
Acid-base deficit: 1
BUN: 8
Bicarbonate: 25.2 — ABNORMAL HIGH
Chloride: 102
Glucose, Bld: 122 — ABNORMAL HIGH
HCT: 33 — ABNORMAL LOW
Hemoglobin: 11.2 — ABNORMAL LOW
Operator id: 274841
Potassium: 4.1
Sodium: 135
TCO2: 27
pCO2, Ven: 47.6
pH, Ven: 7.332 — ABNORMAL HIGH

## 2010-10-23 LAB — POCT I-STAT 3, ART BLOOD GAS (G3+)
Acid-base deficit: 1
Acid-base deficit: 1
Acid-base deficit: 4 — ABNORMAL HIGH
Bicarbonate: 21.3
Bicarbonate: 23.7
Bicarbonate: 24.2 — ABNORMAL HIGH
Bicarbonate: 24.3 — ABNORMAL HIGH
O2 Saturation: 100
O2 Saturation: 100
O2 Saturation: 97
O2 Saturation: 99
Operator id: 252761
Operator id: 294401
Operator id: 3342
Operator id: 3342
Patient temperature: 35.7
Patient temperature: 37.5
TCO2: 22
TCO2: 25
TCO2: 25
TCO2: 26
pCO2 arterial: 37.4
pCO2 arterial: 38.1
pCO2 arterial: 40
pCO2 arterial: 42.7
pH, Arterial: 7.336 — ABNORMAL LOW
pH, Arterial: 7.364
pH, Arterial: 7.396
pH, Arterial: 7.42
pO2, Arterial: 126 — ABNORMAL HIGH
pO2, Arterial: 332 — ABNORMAL HIGH
pO2, Arterial: 433 — ABNORMAL HIGH
pO2, Arterial: 97

## 2010-10-23 LAB — URINALYSIS, ROUTINE W REFLEX MICROSCOPIC
Bilirubin Urine: NEGATIVE
Glucose, UA: NEGATIVE
Hgb urine dipstick: NEGATIVE
Ketones, ur: NEGATIVE
Nitrite: NEGATIVE
Protein, ur: NEGATIVE
Specific Gravity, Urine: 1.013
Urobilinogen, UA: 0.2
pH: 7

## 2010-10-23 LAB — URINE CULTURE
Colony Count: NO GROWTH
Culture: NO GROWTH

## 2010-10-23 LAB — POCT I-STAT 3, VENOUS BLOOD GAS (G3P V)
Acid-base deficit: 3 — ABNORMAL HIGH
Bicarbonate: 23.2
O2 Saturation: 81
Operator id: 3342
TCO2: 25
pCO2, Ven: 47.2
pH, Ven: 7.299
pO2, Ven: 51 — ABNORMAL HIGH

## 2010-10-23 LAB — DIFFERENTIAL
Basophils Absolute: 0
Basophils Relative: 0
Eosinophils Absolute: 0.2
Eosinophils Relative: 2
Lymphocytes Relative: 11 — ABNORMAL LOW
Lymphs Abs: 1.3
Monocytes Absolute: 0.9 — ABNORMAL HIGH
Monocytes Relative: 7
Neutro Abs: 9.4 — ABNORMAL HIGH
Neutrophils Relative %: 80 — ABNORMAL HIGH

## 2010-10-23 LAB — LIPID PANEL
Cholesterol: 268 — ABNORMAL HIGH
HDL: 51
LDL Cholesterol: 158 — ABNORMAL HIGH
Total CHOL/HDL Ratio: 5.3
Triglycerides: 293 — ABNORMAL HIGH
VLDL: 59 — ABNORMAL HIGH

## 2010-10-23 LAB — I-STAT EC8
Acid-base deficit: 6 — ABNORMAL HIGH
BUN: 6
Bicarbonate: 19.3 — ABNORMAL LOW
Chloride: 110
Glucose, Bld: 112 — ABNORMAL HIGH
HCT: 27 — ABNORMAL LOW
Hemoglobin: 9.2 — ABNORMAL LOW
Operator id: 294401
Potassium: 3.8
Sodium: 143
TCO2: 20
pCO2 arterial: 36.2
pH, Arterial: 7.335 — ABNORMAL LOW

## 2010-10-23 LAB — BLOOD GAS, ARTERIAL
Acid-base deficit: 0.1
Bicarbonate: 23.8
Drawn by: 279121
FIO2: 0.21
O2 Saturation: 98
Patient temperature: 98.6
TCO2: 25
pCO2 arterial: 37.7
pH, Arterial: 7.417
pO2, Arterial: 97.4

## 2010-10-23 LAB — CARDIAC PANEL(CRET KIN+CKTOT+MB+TROPI)
CK, MB: 1.4
CK, MB: 10.7 — ABNORMAL HIGH
CK, MB: 5.3 — ABNORMAL HIGH
Relative Index: 7.3 — ABNORMAL HIGH
Relative Index: INVALID
Relative Index: INVALID
Total CK: 147
Total CK: 48
Total CK: 92
Troponin I: 1.75
Troponin I: 3.26
Troponin I: 5.12

## 2010-10-23 LAB — CREATININE, SERUM
Creatinine, Ser: 0.83
Creatinine, Ser: 0.88
GFR calc Af Amer: >60
GFR calc Af Amer: >60
GFR calc non Af Amer: >60
GFR calc non Af Amer: >60

## 2010-10-23 LAB — MAGNESIUM
Magnesium: 2.5
Magnesium: 2.5
Magnesium: 3.1 — ABNORMAL HIGH

## 2010-10-23 LAB — POCT I-STAT GLUCOSE
Glucose, Bld: 99
Operator id: 3342

## 2010-10-23 LAB — PLATELET COUNT: Platelets: 201

## 2010-10-23 LAB — HEMOGLOBIN AND HEMATOCRIT, BLOOD
HCT: 24.6 — ABNORMAL LOW
Hemoglobin: 8.4 — ABNORMAL LOW

## 2010-10-23 LAB — APTT: aPTT: 31

## 2010-10-23 LAB — PROTIME-INR
INR: 1.1
Prothrombin Time: 14.6

## 2010-10-23 LAB — ABO/RH: ABO/RH(D): AB POS

## 2010-10-23 LAB — HEMOGLOBIN A1C
Hgb A1c MFr Bld: 6
Mean Plasma Glucose: 136

## 2010-10-26 LAB — CBC
HCT: 41.9
Hemoglobin: 14.8
MCHC: 35.4
MCV: 92.6
Platelets: 297
RBC: 4.53
RDW: 12.5
WBC: 8.6

## 2010-10-26 LAB — COMPREHENSIVE METABOLIC PANEL
ALT: 27
AST: 32
Albumin: 3.6
Alkaline Phosphatase: 47
BUN: 16
CO2: 28
Calcium: 9.6
Chloride: 103
Creatinine, Ser: 0.85
GFR calc Af Amer: >60
GFR calc non Af Amer: >60
Glucose, Bld: 104 — ABNORMAL HIGH
Potassium: 4.2
Sodium: 136
Total Bilirubin: 0.3
Total Protein: 7.2

## 2010-10-26 LAB — DIFFERENTIAL
Basophils Absolute: 0.1
Basophils Relative: 1
Eosinophils Absolute: 0.2
Eosinophils Relative: 3
Lymphocytes Relative: 16
Lymphs Abs: 1.3
Monocytes Absolute: 0.7
Monocytes Relative: 8
Neutro Abs: 6.2
Neutrophils Relative %: 73

## 2010-10-26 LAB — APTT: aPTT: 27

## 2010-10-26 LAB — PROTIME-INR
INR: 0.8
Prothrombin Time: 11.6

## 2010-10-26 LAB — CK TOTAL AND CKMB (NOT AT ARMC)
CK, MB: 19 — ABNORMAL HIGH
CK, MB: 22.1 — ABNORMAL HIGH
Relative Index: 8.9 — ABNORMAL HIGH
Relative Index: 9.3 — ABNORMAL HIGH
Total CK: 213
Total CK: 237 — ABNORMAL HIGH

## 2010-10-26 LAB — BASIC METABOLIC PANEL
BUN: 16
CO2: 30
Calcium: 10
Chloride: 103
Creatinine, Ser: 0.88
GFR calc Af Amer: >60
GFR calc non Af Amer: >60
Glucose, Bld: 107 — ABNORMAL HIGH
Potassium: 4.2
Sodium: 139

## 2010-10-26 LAB — TSH: TSH: 1.333

## 2010-10-26 LAB — TROPONIN I
Troponin I: 5.67
Troponin I: 5.9

## 2010-12-25 ENCOUNTER — Encounter: Payer: Self-pay | Admitting: Internal Medicine

## 2010-12-25 ENCOUNTER — Ambulatory Visit (INDEPENDENT_AMBULATORY_CARE_PROVIDER_SITE_OTHER): Payer: BC Managed Care – PPO | Admitting: Internal Medicine

## 2010-12-25 DIAGNOSIS — I498 Other specified cardiac arrhythmias: Secondary | ICD-10-CM

## 2010-12-25 LAB — PACEMAKER DEVICE OBSERVATION
AL IMPEDENCE PM: 582 Ohm
AL THRESHOLD: 0.5 V
ATRIAL PACING PM: 81
BAMS-0001: 175 {beats}/min
BATTERY VOLTAGE: 2.79 V
RV LEAD AMPLITUDE: 22.4 mv
RV LEAD IMPEDENCE PM: 485 Ohm
RV LEAD THRESHOLD: 0.75 V
VENTRICULAR PACING PM: 0

## 2010-12-25 NOTE — Patient Instructions (Signed)
Your physician wants you to follow-up in: 6 months with device clinic and 12 months with Dr Allred You will receive a reminder letter in the mail two months in advance. If you don't receive a letter, please call our office to schedule the follow-up appointment.  

## 2010-12-25 NOTE — Assessment & Plan Note (Signed)
Normal pacemaker function See Pace Art report No changes today  

## 2010-12-25 NOTE — Progress Notes (Signed)
PCP:  Oliver Barre, MD, MD  The patient presents today for routine electrophysiology followup.  Since last being seen in our clinic, the patient reports doing very well.  Today, he denies symptoms of palpitations, chest pain, shortness of breath, orthopnea, PND, lower extremity edema, dizziness, presyncope, syncope, or neurologic sequela.  The patient feels that he is tolerating medications without difficulties and is otherwise without complaint today.   Past Medical History  Diagnosis Date  . CAD (coronary artery disease)     The patient presented in Aug 2008 with acute cornary syndrome. He did have a bypass surgeru. He did have a 3-vessel disease on heart cathiterization and had bypass surgery with LIMA to the LAD, sequential vein graft to the obtuse marginal - 1 and obtuse marginal -2, and vein graft to the RCA. Adenosine myoview (11/10): EF 64%, normal wall motion, normal perfusion.   [name] History of echocardiogram     November 2008. EF 65-70%. no regional wall abnormalities. Trivial mitral regurgitation.  [name] GERD (gastroesophageal reflux disease)   . HTN (hypertension)   . Hyperlipidemia   . Sick sinus syndrome     with presyncope. medtronic dual chamber PCM placed 11/10  . Type II or unspecified type diabetes mellitus without mention of complication, not stated as uncontrolled   . Mild anemia     Hx of post op. 8/08  . Paroxysmal atrial fibrillation     only episode noted breifly during hospitalization for PCM placement 11/10.    Past Surgical History  Procedure Date  . Coronary artery bypass graft   . Rectal abcess     2005. Dr. Carolynne Edouard with interanal sphincterotomy  . Pacemaker insertion     MDT implanted 2010    Current Outpatient Prescriptions  Medication Sig Dispense Refill  . acetaminophen (TYLENOL) 325 MG tablet Take 650 mg by mouth every 6 (six) hours as needed.        [name] aspirin 325 MG tablet Take 325 mg by mouth daily.        [name] atorvastatin (LIPITOR) 40 MG tablet Take 1 tablet  (40 mg total) by mouth daily.  90 tablet  3  . ibuprofen (ADVIL,MOTRIN) 200 MG tablet Take 400 mg by mouth every 6 (six) hours as needed.        [name] lisinopril (PRINIVIL,ZESTRIL) 40 MG tablet Take 20 mg by mouth 2 (two) times daily.        . metoprolol (LOPRESSOR) 50 MG tablet Take 1 tablet (50 mg total) by mouth 2 (two) times daily.  180 tablet  3  . Omega-3 Fatty Acids (OMEGA-3 FISH OIL) 1200 MG CAPS Take by mouth 2 (two) times daily.        [name] omeprazole (PRILOSEC) 20 MG capsule TAKE 2 CAPSULES BY MOUTH ONCE DAILY  180 capsule  3    No Known Allergies  History   Social History  . Marital Status: Married    Spouse Name: N/A    Number of Children: N/A  . Years of Education: N/A   Occupational History  . Not on file.   Social History Main Topics  . Smoking status: Former Games developer  . Smokeless tobacco: Not on file  . Alcohol Use: Yes     social on weekends  . Drug Use: No  . Sexually Active: Not on file   Other Topics Concern  . Not on file   Social History Narrative   Married, 3 children. To [place] from [place] 1979. Work- sign spray pain-  graphic systems    Physical Exam: Filed Vitals:   12/25/10 1153  BP: 108/64  Pulse: 68  Height: 5\' 4"  (1.626 m)  Weight: 166 lb (75.297 kg)    GEN- The patient is well appearing, alert and oriented x 3 today.   Head- normocephalic, atraumatic Eyes-  Sclera clear, conjunctiva pink Ears- hearing intact Oropharynx- clear Neck- supple, no JVP Lymph- no cervical lymphadenopathy Lungs- Clear to ausculation bilaterally, normal work of breathing Chest- pacemaker pocket is well healed Heart- Regular rate and rhythm, no murmurs, rubs or gallops, PMI not laterally displaced GI- soft, NT, ND, + BS Extremities- no clubbing, cyanosis, or edema MS- no significant deformity or atrophy Skin- no rash or lesion Psych- euthymic mood, full affect Neuro- strength and sensation are intact  Pacemaker interrogation- reviewed in detail today,  See  PACEART report  Assessment and Plan:

## 2010-12-28 ENCOUNTER — Encounter: Payer: Self-pay | Admitting: Cardiology

## 2010-12-28 ENCOUNTER — Ambulatory Visit (INDEPENDENT_AMBULATORY_CARE_PROVIDER_SITE_OTHER): Payer: BC Managed Care – PPO | Admitting: Cardiology

## 2010-12-28 VITALS — BP 142/82 | HR 72 | Ht 64.0 in | Wt 164.0 lb

## 2010-12-28 DIAGNOSIS — I498 Other specified cardiac arrhythmias: Secondary | ICD-10-CM

## 2010-12-28 DIAGNOSIS — I25810 Atherosclerosis of coronary artery bypass graft(s) without angina pectoris: Secondary | ICD-10-CM

## 2010-12-28 DIAGNOSIS — I10 Essential (primary) hypertension: Secondary | ICD-10-CM

## 2010-12-28 DIAGNOSIS — I2510 Atherosclerotic heart disease of native coronary artery without angina pectoris: Secondary | ICD-10-CM

## 2010-12-28 DIAGNOSIS — E785 Hyperlipidemia, unspecified: Secondary | ICD-10-CM

## 2010-12-28 LAB — BASIC METABOLIC PANEL
BUN: 18 mg/dL (ref 6–23)
CO2: 25 mEq/L (ref 19–32)
Calcium: 9.5 mg/dL (ref 8.4–10.5)
Chloride: 105 mEq/L (ref 96–112)
Creatinine, Ser: 1 mg/dL (ref 0.4–1.5)
GFR: 78.27 mL/min (ref >60.00)
Glucose, Bld: 145 mg/dL — ABNORMAL HIGH (ref 70–99)
Potassium: 4.3 mEq/L (ref 3.5–5.1)
Sodium: 139 mEq/L (ref 135–145)

## 2010-12-28 LAB — LIPID PANEL
Cholesterol: 138 mg/dL (ref 0–200)
HDL: 61.3 mg/dL (ref >39.00)
LDL Cholesterol: 48 mg/dL (ref 0–99)
Total CHOL/HDL Ratio: 2
Triglycerides: 146 mg/dL (ref 0.0–149.0)
VLDL: 29.2 mg/dL (ref 0.0–40.0)

## 2010-12-28 NOTE — Assessment & Plan Note (Signed)
Sick sinus syndrome, has PCM with followup with Dr. Johney Frame.

## 2010-12-28 NOTE — Assessment & Plan Note (Signed)
Now on lower statin dose.  Will check lipids today, goal LDL < 70.

## 2010-12-28 NOTE — Progress Notes (Signed)
PCP: Dr. Jonny Ruiz  [name] [name] is a 55 yo with male with history of CAD s/p CABG and bradycardia s/p Medtronic pacemaker who presents today for cardiology followup.  He says he has been doing well.  He is working full time as a Librarian, academic.  No exertional dyspnea or chest pain. No palpitations or lightheadedness.  BP has been reasonably well-controlled recently.  His Lipitor was decreased this summer by Dr. Jonny Ruiz.   Labs (8/12): LDL 36, HDl 64, TGs 138, K 4.7, creatinine 1.0  Past Medical History  Diagnosis Date  . CAD (coronary artery disease)     The patient presented in Aug 2008 with acute cornary syndrome. He did have a bypass surgeru. He did have a 3-vessel disease on heart cathiterization and had bypass surgery with LIMA to the LAD, sequential vein graft to the obtuse marginal - 1 and obtuse marginal -2, and vein graft to the RCA. Adenosine myoview (11/10): EF 64%, normal wall motion, normal perfusion.   [name] History of echocardiogram     November 2008. EF 65-70%. no regional wall abnormalities. Trivial mitral regurgitation.  [name] GERD (gastroesophageal reflux disease)   . HTN (hypertension)   . Hyperlipidemia   . Sick sinus syndrome     with presyncope. medtronic dual chamber PCM placed 11/10  . Type II or unspecified type diabetes mellitus without mention of complication, not stated as uncontrolled   . Mild anemia     Hx of post op. 8/08  . Paroxysmal atrial fibrillation     only episode noted breifly during hospitalization for PCM placement 11/10.     Current Outpatient Prescriptions  Medication Sig Dispense Refill  . acetaminophen (TYLENOL) 325 MG tablet Take 650 mg by mouth every 6 (six) hours as needed.        [name] aspirin 325 MG tablet Take 325 mg by mouth daily.        [name] atorvastatin (LIPITOR) 40 MG tablet Take 1 tablet (40 mg total) by mouth daily.  90 tablet  3  . ibuprofen (ADVIL,MOTRIN) 200 MG tablet Take 400 mg by mouth every 6 (six) hours as needed.        [name] lisinopril  (PRINIVIL,ZESTRIL) 40 MG tablet Take 40 mg by mouth daily.        . metoprolol (LOPRESSOR) 50 MG tablet Take 1 tablet (50 mg total) by mouth 2 (two) times daily.  180 tablet  3  . Omega-3 Fatty Acids (OMEGA-3 FISH OIL) 1200 MG CAPS Take by mouth 2 (two) times daily.        [name] omeprazole (PRILOSEC) 20 MG capsule TAKE 2 CAPSULES BY MOUTH ONCE DAILY  180 capsule  3     PHYSICAL EXAM: Filed Vitals:   12/28/10 0843  BP: 142/82  Pulse:    General:  Well appearing. No resp difficulty HEENT: normal Neck: supple. JVP flat. Carotids 2+ bilaterally; no bruits. No lymphadenopathy or thryomegaly appreciated. Cor: PMI normal. Regular rate & rhythm. No rubs, gallops or murmurs. Lungs: clear Abdomen: soft, nontender, nondistended. No hepatosplenomegaly. No bruits or masses. Good bowel sounds. Extremities: no cyanosis, clubbing, rash, edema Neuro: alert & orientedx3, cranial nerves grossly intact. Moves all 4 extremities w/o difficulty. Affect pleasant.

## 2010-12-28 NOTE — Patient Instructions (Signed)
Your physician recommends that you have  lab work today=---lipid profile/BMET 414.05  Your physician wants you to follow-up in: 1 year with Dr Shirlee Latch.(December 2013). You will receive a reminder letter in the mail two months in advance. If you don't receive a letter, please call our office to schedule the follow-up appointment.

## 2010-12-28 NOTE — Assessment & Plan Note (Signed)
Stable with no ischemic symptoms.  Continue ASA, ACEI, statin, metoprolol.

## 2010-12-28 NOTE — Assessment & Plan Note (Signed)
BP ok today Continue to monitor 

## 2011-01-01 ENCOUNTER — Encounter: Payer: Self-pay | Admitting: Internal Medicine

## 2011-03-05 ENCOUNTER — Ambulatory Visit (INDEPENDENT_AMBULATORY_CARE_PROVIDER_SITE_OTHER): Payer: BC Managed Care – PPO | Admitting: Internal Medicine

## 2011-03-05 ENCOUNTER — Encounter: Payer: Self-pay | Admitting: Gastroenterology

## 2011-03-05 ENCOUNTER — Encounter: Payer: Self-pay | Admitting: Internal Medicine

## 2011-03-05 VITALS — BP 144/80 | HR 66 | Temp 98.0°F | Ht 64.0 in | Wt 166.4 lb

## 2011-03-05 DIAGNOSIS — E119 Type 2 diabetes mellitus without complications: Secondary | ICD-10-CM

## 2011-03-05 DIAGNOSIS — M25519 Pain in unspecified shoulder: Secondary | ICD-10-CM

## 2011-03-05 DIAGNOSIS — M25511 Pain in right shoulder: Secondary | ICD-10-CM

## 2011-03-05 DIAGNOSIS — I10 Essential (primary) hypertension: Secondary | ICD-10-CM

## 2011-03-05 DIAGNOSIS — Z0000 Encounter for general adult medical examination without abnormal findings: Secondary | ICD-10-CM

## 2011-03-05 MED ORDER — AMLODIPINE BESYLATE 5 MG PO TABS: 5.0000 mg | ORAL_TABLET | Freq: Every day | ORAL | Status: DC

## 2011-03-05 MED ORDER — MELOXICAM 15 MG PO TABS: 15.0000 mg | ORAL_TABLET | Freq: Every day | ORAL | Status: DC

## 2011-03-05 NOTE — Assessment & Plan Note (Signed)
Mild uncontrolled, to add amlod 5 qd, Continue all other medications as before,  to f/u any worsening symptoms or concerns

## 2011-03-05 NOTE — Assessment & Plan Note (Signed)
?   rotater cuff - for ortho referral, d/c the ibuprofen, for mobic asd prn

## 2011-03-05 NOTE — Assessment & Plan Note (Signed)
stable overall by hx and exam, most recent data reviewed with pt, and pt to continue medical treatment as before, for a1c, many need start oha as diet control may be less effective this yr I suspect

## 2011-03-05 NOTE — Patient Instructions (Addendum)
Please stop the ibuprofen(advil) Ok to start the meloxicam 15 mg per day as needed for pain Also, start the amlodipine 5 mg per day for blood pressure Continue all other medications as before, including the other blood pressure medicines You will be contacted regarding the referral for: orthopedic, as well as the colonoscopy Please return in 6 mo with Lab testing done 3-5 days before

## 2011-03-05 NOTE — Progress Notes (Signed)
Subjective:    Patient ID: [name] [name], male    DOB: [birth year], 56 y.o.   MRN: [id number]  HPI  Here for wellness and f/u;  Overall doing ok;  Pt denies CP, worsening SOB, DOE, wheezing, orthopnea, PND, worsening [place] edema, palpitations, dizziness or syncope.  Pt denies neurological change such as new Headache, facial or extremity weakness.  Pt denies polydipsia, polyuria, or low sugar symptoms. Pt states overall good compliance with treatment and medications, good tolerability, and trying to follow lower cholesterol diet.  Pt denies worsening depressive symptoms, suicidal ideation or panic. No fever, wt loss, night sweats, loss of appetite, or other constitutional symptoms.  Pt states good ability with ADL's, low fall risk, home safety reviewed and adequate, no significant changes in hearing or vision, and daily active with exercise on treadmill at 3.5 MPH for 45 min.  Despite this pt notes BP has been elevated on several occasions checked recetnly, has not been able to lose significant wt since last visit.  I note also last a1c 2012 was 6.8, increased gradually over the past previous yrs.  Pt also missed f/u for colonscopy after recall letter per [facility] 2012.  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   Past Medical History  Diagnosis Date  . CAD (coronary artery disease)     The patient presented in Aug 2008 with acute cornary syndrome. He did have a bypass surgeru. He did have a 3-vessel disease on heart cathiterization and had bypass surgery with LIMA to the LAD, sequential vein graft to the obtuse marginal - 1 and obtuse marginal -2, and vein graft to the RCA. Adenosine myoview (11/10): EF 64%, normal wall motion, normal perfusion.   [name] History of echocardiogram     November 2008. EF 65-70%. no regional wall abnormalities. Trivial mitral regurgitation.  [name] GERD (gastroesophageal reflux disease)   . HTN  (hypertension)   . Hyperlipidemia   . Sick sinus syndrome     with presyncope. medtronic dual chamber PCM placed 11/10  . Type II or unspecified type diabetes mellitus without mention of complication, not stated as uncontrolled   . Mild anemia     Hx of post op. 8/08  . Paroxysmal atrial fibrillation     only episode noted breifly during hospitalization for PCM placement 11/10.    Past Surgical History  Procedure Date  . Coronary artery bypass graft   . Rectal abcess     2005. Dr. Carolynne Edouard with interanal sphincterotomy  . Pacemaker insertion     MDT implanted 2010    reports that he has quit smoking. He does not have any smokeless tobacco history on file. He reports that he drinks alcohol. He reports that he does not use illicit drugs. family history is not on file. No Known Allergies Current Outpatient Prescriptions on File Prior to Visit  Medication Sig Dispense Refill  . acetaminophen (TYLENOL) 325 MG tablet Take 650 mg by mouth every 6 (six) hours as needed.        [name] aspirin 325 MG tablet Take 325 mg by mouth daily.        [name] atorvastatin (LIPITOR) 40 MG tablet Take 1 tablet (40 mg total) by mouth daily.  90 tablet  3  . ibuprofen (ADVIL,MOTRIN) 200 MG tablet Take 400 mg by mouth every 6 (six) hours as needed.        [name]  lisinopril (PRINIVIL,ZESTRIL) 40 MG tablet Take 40 mg by mouth daily.        . metoprolol (LOPRESSOR) 50 MG tablet Take 1 tablet (50 mg total) by mouth 2 (two) times daily.  180 tablet  3  . Omega-3 Fatty Acids (OMEGA-3 FISH OIL) 1200 MG CAPS Take by mouth 2 (two) times daily.        [name] omeprazole (PRILOSEC) 20 MG capsule TAKE 2 CAPSULES BY MOUTH ONCE DAILY  180 capsule  3   Review of Systems Review of Systems  Constitutional: Negative for diaphoresis, activity change, appetite change and unexpected weight change.  HENT: Negative for hearing loss, ear pain, facial swelling, mouth sores and neck stiffness.   Eyes: Negative for pain, redness and visual disturbance.    Respiratory: Negative for shortness of breath and wheezing.   Cardiovascular: Negative for chest pain and palpitations.  Gastrointestinal: Negative for diarrhea, blood in stool, abdominal distention and rectal pain.  Genitourinary: Negative for hematuria, flank pain and decreased urine volume.  Musculoskeletal: Negative for myalgias and joint swelling.  Skin: Negative for color change and wound.  Neurological: Negative for syncope and numbness.  Hematological: Negative for adenopathy.  Psychiatric/Behavioral: Negative for hallucinations, self-injury, decreased concentration and agitation.      Objective:   Physical Exam BP 144/80  Pulse 66  Temp(Src) 98 F (36.7 C) (Oral)  Ht 5\' 4"  (1.626 m)  Wt 166 lb 6 oz (75.467 kg)  BMI 28.56 kg/m2  SpO2 98% Physical Exam  VS noted Constitutional: Pt is oriented to person, place, and time. Appears well-developed and well-nourished.  HENT:  Head: Normocephalic and atraumatic.  Right Ear: External ear normal.  Left Ear: External ear normal.  Nose: Nose normal.  Mouth/Throat: Oropharynx is clear and moist.  Eyes: Conjunctivae and EOM are normal. Pupils are equal, round, and reactive to light.  Neck: Normal range of motion. Neck supple. No JVD present. No tracheal deviation present.  Cardiovascular: Normal rate, regular rhythm, normal heart sounds and intact distal pulses.   Pulmonary/Chest: Effort normal and breath sounds normal.  Abdominal: Soft. Bowel sounds are normal. There is no tenderness.  Musculoskeletal: Normal range of motion. Exhibits no edema.  Lymphadenopathy:  Has no cervical adenopathy.  Neurological: Pt is alert and oriented to person, place, and time. Pt has normal reflexes. No cranial nerve deficit.  Skin: Skin is warm and dry. No rash noted.  Psychiatric:  Has  normal mood and affect. Behavior is normal.  Right shoudler with pain on abduction, but FROM, no rash or swelling    Assessment & Plan:

## 2011-03-05 NOTE — Assessment & Plan Note (Signed)
Overall doing well, age appropriate education and counseling updated, referrals for preventative services and immunizations addressed, dietary and smoking counseling addressed, most recent labs and ECG reviewed.  I have personally reviewed and have noted: 1) the patient's medical and social history 2) The pt's use of alcohol, tobacco, and illicit drugs 3) The patient's current medications and supplements 4) Functional ability including ADL's, fall risk, home safety risk, hearing and visual impairment 5) Diet and physical activities 6) Evidence for depression or mood disorder 7) The patient's height, weight, and BMI have been recorded in the chart I have made referrals, and provided counseling and education based on review of the above ALso for f/u colonscopy as he is due

## 2011-03-17 ENCOUNTER — Encounter: Payer: Self-pay | Admitting: Internal Medicine

## 2011-03-17 ENCOUNTER — Ambulatory Visit (INDEPENDENT_AMBULATORY_CARE_PROVIDER_SITE_OTHER): Payer: BC Managed Care – PPO | Admitting: Internal Medicine

## 2011-03-17 VITALS — BP 112/60 | HR 97 | Temp 98.2°F | Ht 62.0 in | Wt 166.0 lb

## 2011-03-17 DIAGNOSIS — K644 Residual hemorrhoidal skin tags: Secondary | ICD-10-CM

## 2011-03-17 DIAGNOSIS — K602 Anal fissure, unspecified: Secondary | ICD-10-CM

## 2011-03-17 DIAGNOSIS — K625 Hemorrhage of anus and rectum: Secondary | ICD-10-CM

## 2011-03-17 MED ORDER — DOCUSATE SODIUM 100 MG PO CAPS: 100.0000 mg | ORAL_CAPSULE | Freq: Two times a day (BID) | ORAL | Status: AC

## 2011-03-17 MED ORDER — HYDROCORTISONE ACE-PRAMOXINE 2.5-1 % RE CREA: TOPICAL_CREAM | Freq: Three times a day (TID) | RECTAL | Status: AC

## 2011-03-17 NOTE — Progress Notes (Signed)
Subjective:    Patient ID: [name] [name], male    DOB: Nov 06, 1955, 56 y.o.   MRN: [id number]  Rectal Bleeding  The current episode started today. The onset was sudden. The problem occurs rarely. The problem has been resolved. The pain is moderate. The stool is described as streaked with blood, soft and hard. Associated symptoms include rectal pain (with BM). Pertinent negatives include no fever, no abdominal pain, no diarrhea, no hemorrhoids, no nausea, no vomiting and no rash. He has been eating and drinking normally. His past medical history does not include abdominal surgery, inflammatory bowel disease, recent abdominal injury, recent antibiotic use, recent change in diet or a recent illness. There were no sick contacts. He has received no recent medical care.   Past Medical History  Diagnosis Date  . CAD (coronary artery disease)     The patient presented in Aug 2008 with acute cornary syndrome. He did have a bypass surgeru. He did have a 3-vessel disease on heart cathiterization and had bypass surgery with LIMA to the LAD, sequential vein graft to the obtuse marginal - 1 and obtuse marginal -2, and vein graft to the RCA. Adenosine myoview (11/10): EF 64%, normal wall motion, normal perfusion.   [name] History of echocardiogram     November 2008. EF 65-70%. no regional wall abnormalities. Trivial mitral regurgitation.  [name] GERD (gastroesophageal reflux disease)   . HTN (hypertension)   . Hyperlipidemia   . Sick sinus syndrome     with presyncope. medtronic dual chamber PCM placed 11/10  . Type II or unspecified type diabetes mellitus without mention of complication, not stated as uncontrolled   . Mild anemia     Hx of post op. 8/08  . Paroxysmal atrial fibrillation     only episode noted breifly during hospitalization for PCM placement 11/10.     Review of Systems  Constitutional: Negative for fever, fatigue and unexpected weight change.  Gastrointestinal: Positive for hematochezia and rectal pain  (with BM). Negative for nausea, vomiting, abdominal pain, diarrhea and hemorrhoids.  Skin: Negative for rash.       Objective:   Physical Exam BP 112/60  Pulse 97  Temp(Src) 98.2 F (36.8 C) (Oral)  Ht 5\' 2"  (1.575 m)  Wt 166 lb (75.297 kg)  BMI 30.36 kg/m2  SpO2 96% Wt Readings from Last 3 Encounters:  03/17/11 166 lb (75.297 kg)  03/05/11 166 lb 6 oz (75.467 kg)  12/28/10 164 lb (74.39 kg)   Constitutional:  He appears well-developed and well-nourished. No distress.  Neck: Normal range of motion. Neck supple. No JVD present. No thyromegaly present.  Cardiovascular: Normal rate, regular rhythm and normal heart sounds.  No murmur heard. no BLE edema Pulmonary/Chest: Effort normal and breath sounds normal. No respiratory distress. no wheezes.  Abdominal: Soft. Bowel sounds are normal. Patient exhibits no distension. There is no tenderness.  Rectal: Painful palpation with evidence of anal fissure - trace gross blood/mucous present, hard stool in vault, also nonthrombosed external hemorrhoid - anal skin with superficial abrasion also present. Skin: Skin is warm and dry.  No erythema or ulceration.  Psychiatric: he has a normal mood and affect. behavior is normal. Judgment and thought content normal.   Lab Results  Component Value Date   WBC 8.2 03/03/2010   HGB 13.4 03/03/2010   HCT 39.2 03/03/2010   PLT 215.0 03/03/2010   GLUCOSE 145* 12/28/2010   CHOL 138 12/28/2010   TRIG 146.0 12/28/2010   HDL 61.30 12/28/2010  LDLDIRECT 47.8 03/03/2010   LDLCALC 48 12/28/2010   ALT 39 03/03/2010   AST 32 03/03/2010   NA 139 12/28/2010   K 4.3 12/28/2010   CL 105 12/28/2010   CREATININE 1.0 12/28/2010   BUN 18 12/28/2010   CO2 25 12/28/2010   TSH 2.66 03/03/2010   PSA 0.26 03/03/2010   INR 0.94 11/10/2008   HGBA1C 6.6* 08/28/2010   MICROALBUR 0.5 02/28/2009   Colonoscopy April 2010: Limited due to poor prep; internal and external hemorrhoids present    Assessment & Plan:  Rectal  bleeding with external hemorrhoid and anal fissure  Treat with stool softeners, hydration and topical analgesia - erx done Started on sitz bath Education provided, patient to call if unimproved in 2-3 days, sooner if worse has planned repeat colonoscopy in April with Dr. Christella Hartigan, may need evaluation sooner if continued problems

## 2011-03-17 NOTE — Patient Instructions (Signed)
It was good to see you today. Use rectal cream for pain and colace stool softener 2x/day to avoid hard stools and straining - Your prescription(s) have been submitted to your pharmacy. Please take as directed and contact our office if you believe you are having problem(s) with the medication(s). Also Sitz baths as directed and drink plenty of fluids Call if symptoms of bleeding and pain unimproved in next 2-3 days, sooner if worse   Sitz Bath A sitz bath is a warm water bath taken in the sitting position. The water covers only the hips and butt (buttocks). It may be used for either healing or cleaning purposes. Sitz baths are also used to relieve pain, itching, or muscle tightening (spasms). The water may contain medicine. Moist heat will help you heal and relax.   HOME CARE    Fill the bathtub half-full with warm water.   Sit in the water and open the drain a little.   Turn on the warm water to keep the tub half-full. Keep the water running constantly.   Soak in the water for 15 to 20 minutes.   After the sitz bath, pat the affected area dry.   Take 3 to 4 sitz baths a day.  GET HELP RIGHT AWAY IF: You get worse instead of better. Stop the sitz baths if you get worse. MAKE SURE YOU:  Understand these instructions.   Will watch your condition.   Will get help right away if you are not doing well or get worse.  Document Released: 02/05/2004 Document Revised: 12/17/2010 Document Reviewed: 04/27/2010 [facility] Patient Information 2012 [place], [place].

## 2011-04-04 ENCOUNTER — Other Ambulatory Visit: Payer: Self-pay | Admitting: Cardiology

## 2011-04-12 ENCOUNTER — Ambulatory Visit (AMBULATORY_SURGERY_CENTER): Payer: BC Managed Care – PPO | Admitting: *Deleted

## 2011-04-12 VITALS — Ht 64.0 in | Wt 164.0 lb

## 2011-04-12 DIAGNOSIS — Z1211 Encounter for screening for malignant neoplasm of colon: Secondary | ICD-10-CM

## 2011-04-12 MED ORDER — PEG-KCL-NACL-NASULF-NA ASC-C 100 G PO SOLR: ORAL | Status: DC

## 2011-04-12 NOTE — Progress Notes (Signed)
Explained the MoviPrep instructions very carefully to patient and the need to drink 1 liter evening & 1 liter morning of exam. Patient seems to  Understand all instructions. No further questions from patient.

## 2011-04-13 ENCOUNTER — Encounter: Payer: Self-pay | Admitting: Gastroenterology

## 2011-04-23 ENCOUNTER — Encounter: Payer: Self-pay | Admitting: Gastroenterology

## 2011-04-23 ENCOUNTER — Ambulatory Visit (AMBULATORY_SURGERY_CENTER): Payer: BC Managed Care – PPO | Admitting: Gastroenterology

## 2011-04-23 VITALS — BP 129/80 | HR 68 | Temp 97.0°F | Resp 16 | Ht 64.0 in | Wt 164.0 lb

## 2011-04-23 DIAGNOSIS — Z1211 Encounter for screening for malignant neoplasm of colon: Secondary | ICD-10-CM

## 2011-04-23 MED ORDER — SODIUM CHLORIDE 0.9 % IV SOLN: 500.0000 mL | INTRAVENOUS | Status: DC

## 2011-04-23 NOTE — Patient Instructions (Addendum)
YOU HAD AN ENDOSCOPIC PROCEDURE TODAY AT THE [facility]: Refer to the procedure report that was given to you for any specific questions about what was found during the examination.  If the procedure report does not answer your questions, please call your gastroenterologist to clarify.  If you requested that your care partner not be given the details of your procedure findings, then the procedure report has been included in a sealed envelope for you to review at your convenience later.  YOU SHOULD EXPECT: Some feelings of bloating in the abdomen. Passage of more gas than usual.  Walking can help get rid of the air that was put into your GI tract during the procedure and reduce the bloating. If you had a lower endoscopy (such as a colonoscopy or flexible sigmoidoscopy) you may notice spotting of blood in your stool or on the toilet paper. If you underwent a bowel prep for your procedure, then you may not have a normal bowel movement for a few days.  DIET: Your first meal following the procedure should be a light meal and then it is ok to progress to your normal diet.  A half-sandwich or bowl of soup is an example of a good first meal.  Heavy or fried foods are harder to digest and may make you feel nauseous or bloated.  Likewise meals heavy in dairy and vegetables can cause extra gas to form and this can also increase the bloating.  Drink plenty of fluids but you should avoid alcoholic beverages for 24 hours.  ACTIVITY: Your care partner should take you home directly after the procedure.  You should plan to take it easy, moving slowly for the rest of the day.  You can resume normal activity the day after the procedure however you should NOT DRIVE or use heavy machinery for 24 hours (because of the sedation medicines used during the test).    SYMPTOMS TO REPORT IMMEDIATELY: A gastroenterologist can be reached at any hour.  During normal business hours, 8:30 AM to 5:00 PM Monday through Friday,  call [phone].  After hours and on weekends, please call the GI answering service at [phone] who will take a message and have the physician on call contact you.   Following lower endoscopy (colonoscopy or flexible sigmoidoscopy):  Excessive amounts of blood in the stool  Significant tenderness or worsening of abdominal pains  Swelling of the abdomen that is new, acute  Fever of 100F or higher  Following upper endoscopy (EGD)  Vomiting of blood or coffee ground material  New chest pain or pain under the shoulder blades  Painful or persistently difficult swallowing  New shortness of breath  Fever of 100F or higher  Black, tarry-looking stools  FOLLOW UP: If any biopsies were taken you will be contacted by phone or by letter within the next 1-3 weeks.  Call your gastroenterologist if you have not heard about the biopsies in 3 weeks.  Our staff will call the home number listed on your records the next business day following your procedure to check on you and address any questions or concerns that you may have at that time regarding the information given to you following your procedure. This is a courtesy call and so if there is no answer at the home number and we have not heard from you through the emergency physician on call, we will assume that you have returned to your regular daily activities without incident.  SIGNATURES/CONFIDENTIALITY: You and/or your care   partner have signed paperwork which will be entered into your electronic medical record.  These signatures attest to the fact that that the information above on your After Visit Summary has been reviewed and is understood.  Full responsibility of the confidentiality of this discharge information lies with you and/or your care-partner.   REPEAT EXAM IN 10 YEARS-2023 

## 2011-04-23 NOTE — Progress Notes (Signed)
Patient did not experience any of the following events: a burn prior to discharge; a fall within the facility; wrong site/side/patient/procedure/implant event; or a hospital transfer or hospital admission upon discharge from the facility. (G8907) Patient did not have preoperative order for IV antibiotic SSI prophylaxis. (G8918)  

## 2011-04-23 NOTE — Op Note (Signed)
[facility] [address] [place], [place]  [address]  COLONOSCOPY PROCEDURE REPORT  PATIENT:  [name] [name], [name] [name]  MR#:  [id number] BIRTHDATE:  September 11, 1955, 56 yrs. old  GENDER:  male ENDOSCOPIST:  Rachael Fee, MD PROCEDURE DATE:  04/23/2011 PROCEDURE:  Colonoscopy 19147 ASA CLASS:  Class II INDICATIONS:  Routine Risk Screening, poorly prepped colonsocopy 2010 (was recommended to have repeat examination in 1 year) MEDICATIONS:   Fentanyl 25 mcg IV, These medications were titrated to patient response per physician's verbal order, Versed 5 mg IV  DESCRIPTION OF PROCEDURE:   After the risks benefits and alternatives of the procedure were thoroughly explained, informed consent was obtained.  Digital rectal exam was performed and revealed no rectal masses.   The LB [id number] endoscope was introduced through the anus and advanced to the cecum, which was identified by both the appendix and ileocecal valve, without limitations.  The quality of the prep was good..  The instrument was then slowly withdrawn as the colon was fully examined. <<PROCEDUREIMAGES>> FINDINGS:  A normal appearing cecum, ileocecal valve, and appendiceal orifice were identified. The ascending, hepatic flexure, transverse, splenic flexure, descending, sigmoid colon, and rectum appeared unremarkable (see image1, image2, and image3). Retroflexed views in the rectum revealed no abnormalities. COMPLICATIONS:  None  ENDOSCOPIC IMPRESSION: 1) Normal colon  RECOMMENDATIONS: 1) You should continue to follow colorectal cancer screening guidelines for "routine risk" patients with a repeat colonoscopy in 10 years. There is no need for FOBT (stool) testing for at least 5 years.  REPEAT EXAM:  10 years  ______________________________ Rachael Fee, MD  n. eSIGNED:   Rachael Fee at 04/23/2011 11:00 AM  Sharlene Motts, [phone]

## 2011-04-26 ENCOUNTER — Telehealth: Payer: Self-pay | Admitting: *Deleted

## 2011-04-26 NOTE — Telephone Encounter (Signed)
  Follow up Call-  Call back number 04/23/2011  Post procedure Call Back phone  # [phone]  Permission to leave phone message No     Patient questions:  Do you have a fever, pain , or abdominal swelling? no Pain Score  0 *  Have you tolerated food without any problems? yes  Have you been able to return to your normal activities? yes  Do you have any questions about your discharge instructions: Diet   no Medications  no Follow up visit  no  Do you have questions or concerns about your Care? no  Actions: * If pain score is 4 or above: No action needed, pain <4.

## 2011-06-22 IMAGING — CR DG CHEST 1V PORT
1 series · 1 of 1 positions shown · non-contrast
Comparison: 09/16/2006

CLINICAL DATA: Syncope

PORTABLE CHEST - 1 VIEW

[view not recorded]
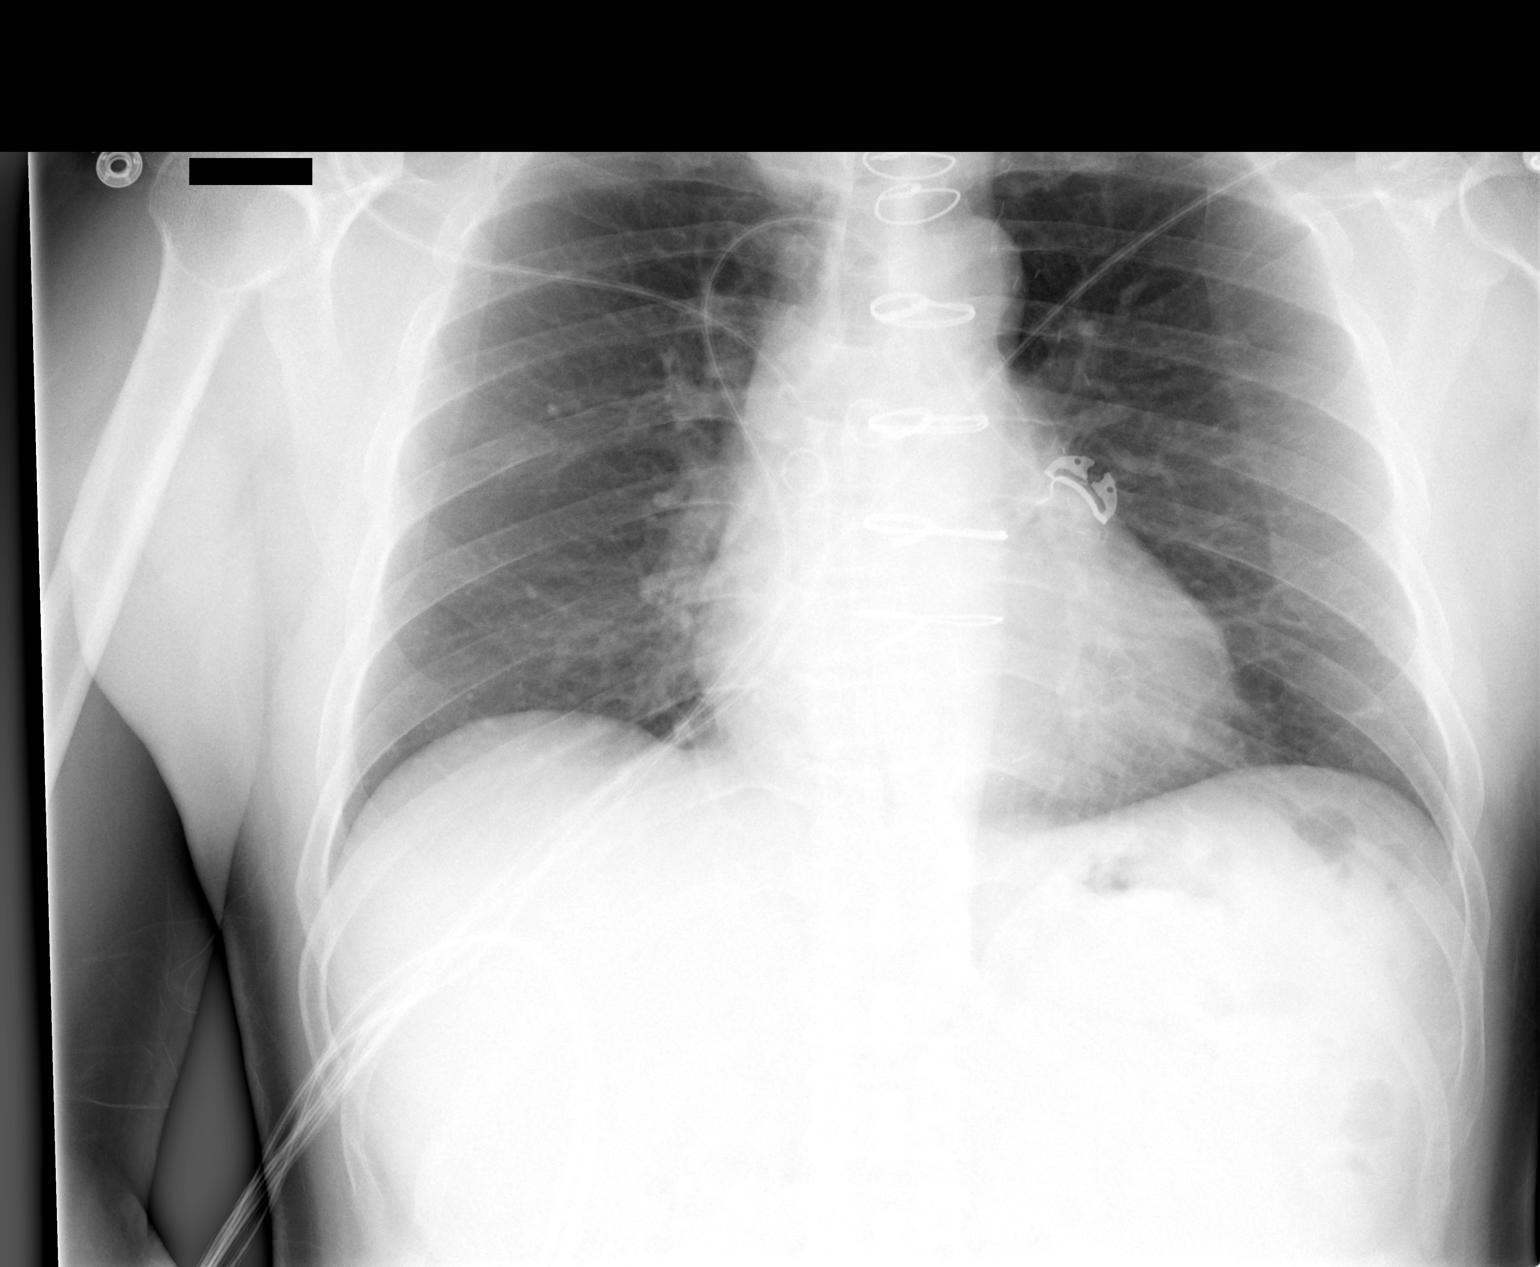

[1 of 1 positions shown; findings below may reference images not displayed]

FINDINGS: The heart size is normal.

No pleural effusion or pulmonary edema is noted.

No airspace disease identified.
IMPRESSION: 1.  No active disease.

## 2011-06-25 IMAGING — CR DG CHEST 2V
2 series · 2 of 2 positions shown · non-contrast
Comparison: 11/09/2008

CLINICAL DATA: Bradycardia and status post pacer placement.

CHEST - 2 VIEW

[w chest pa]
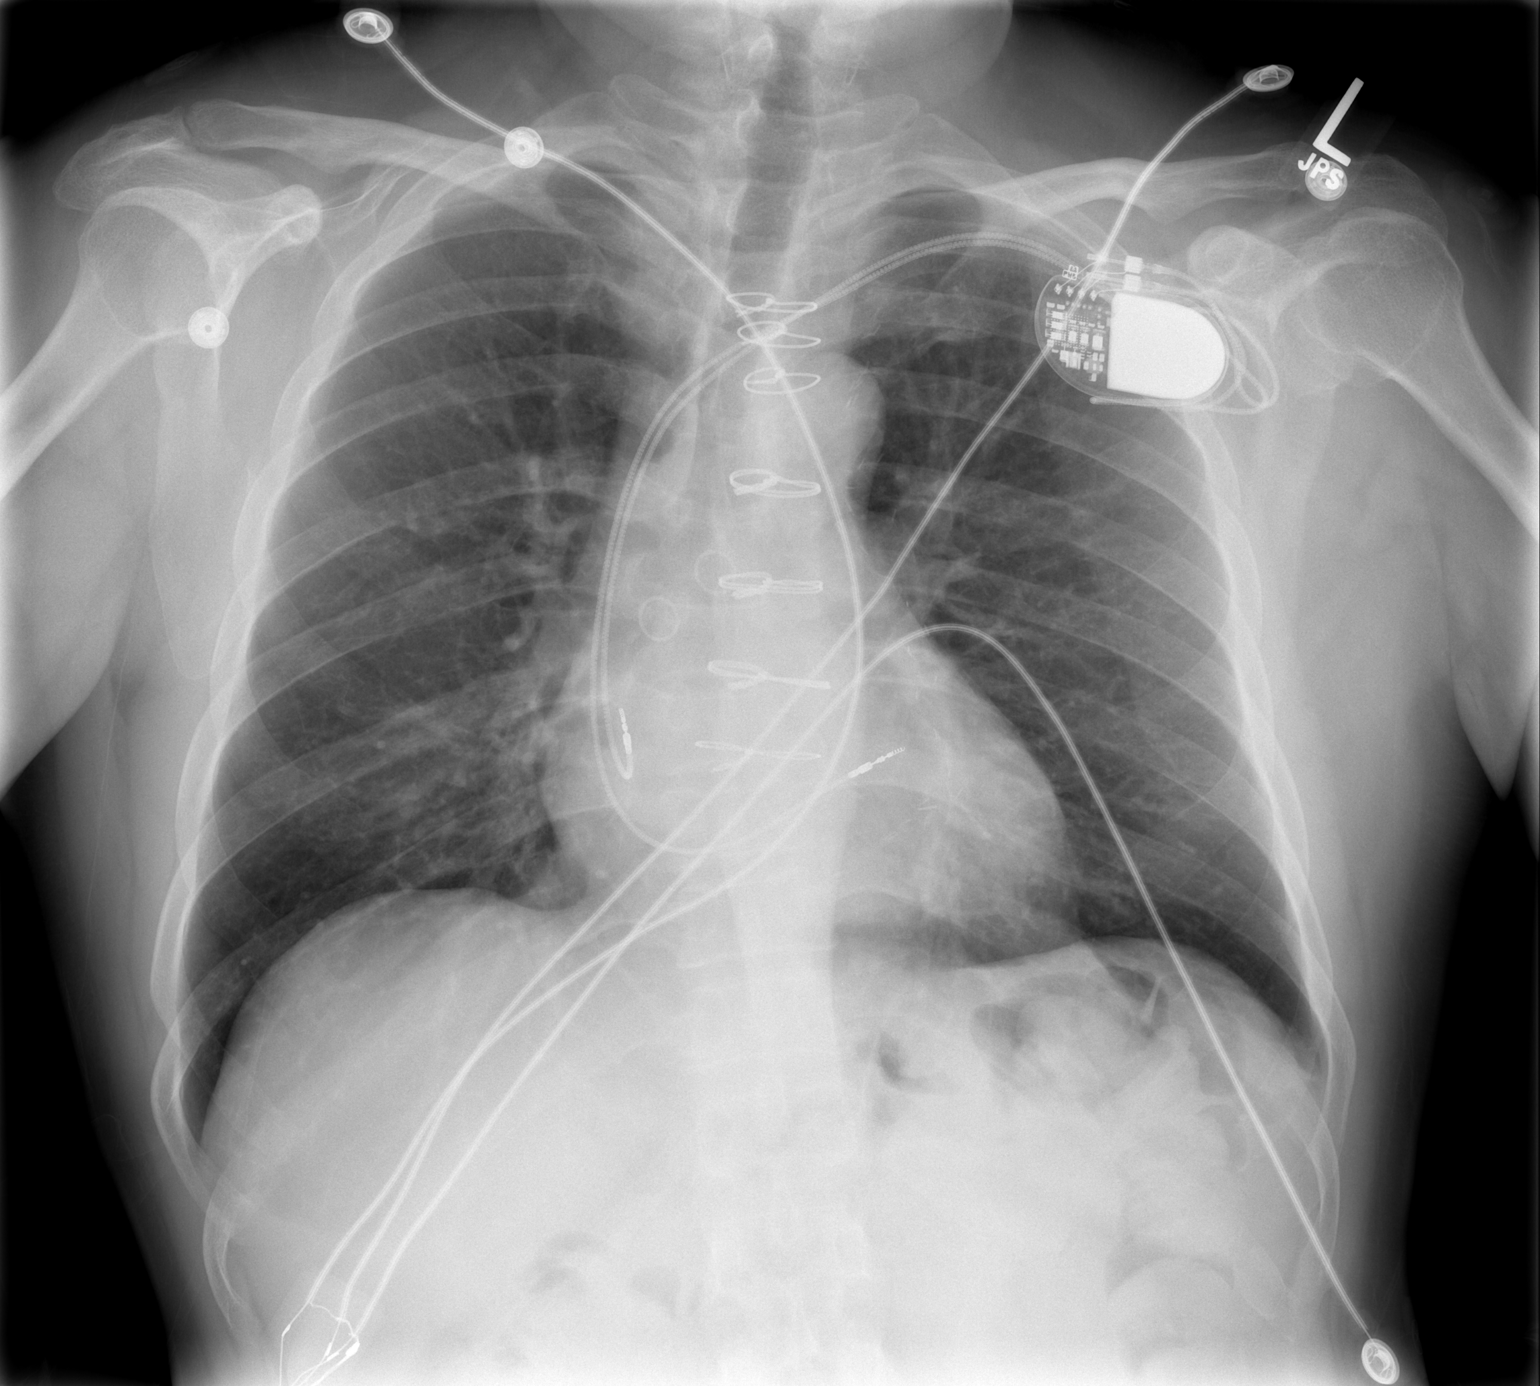

[w chest lat]
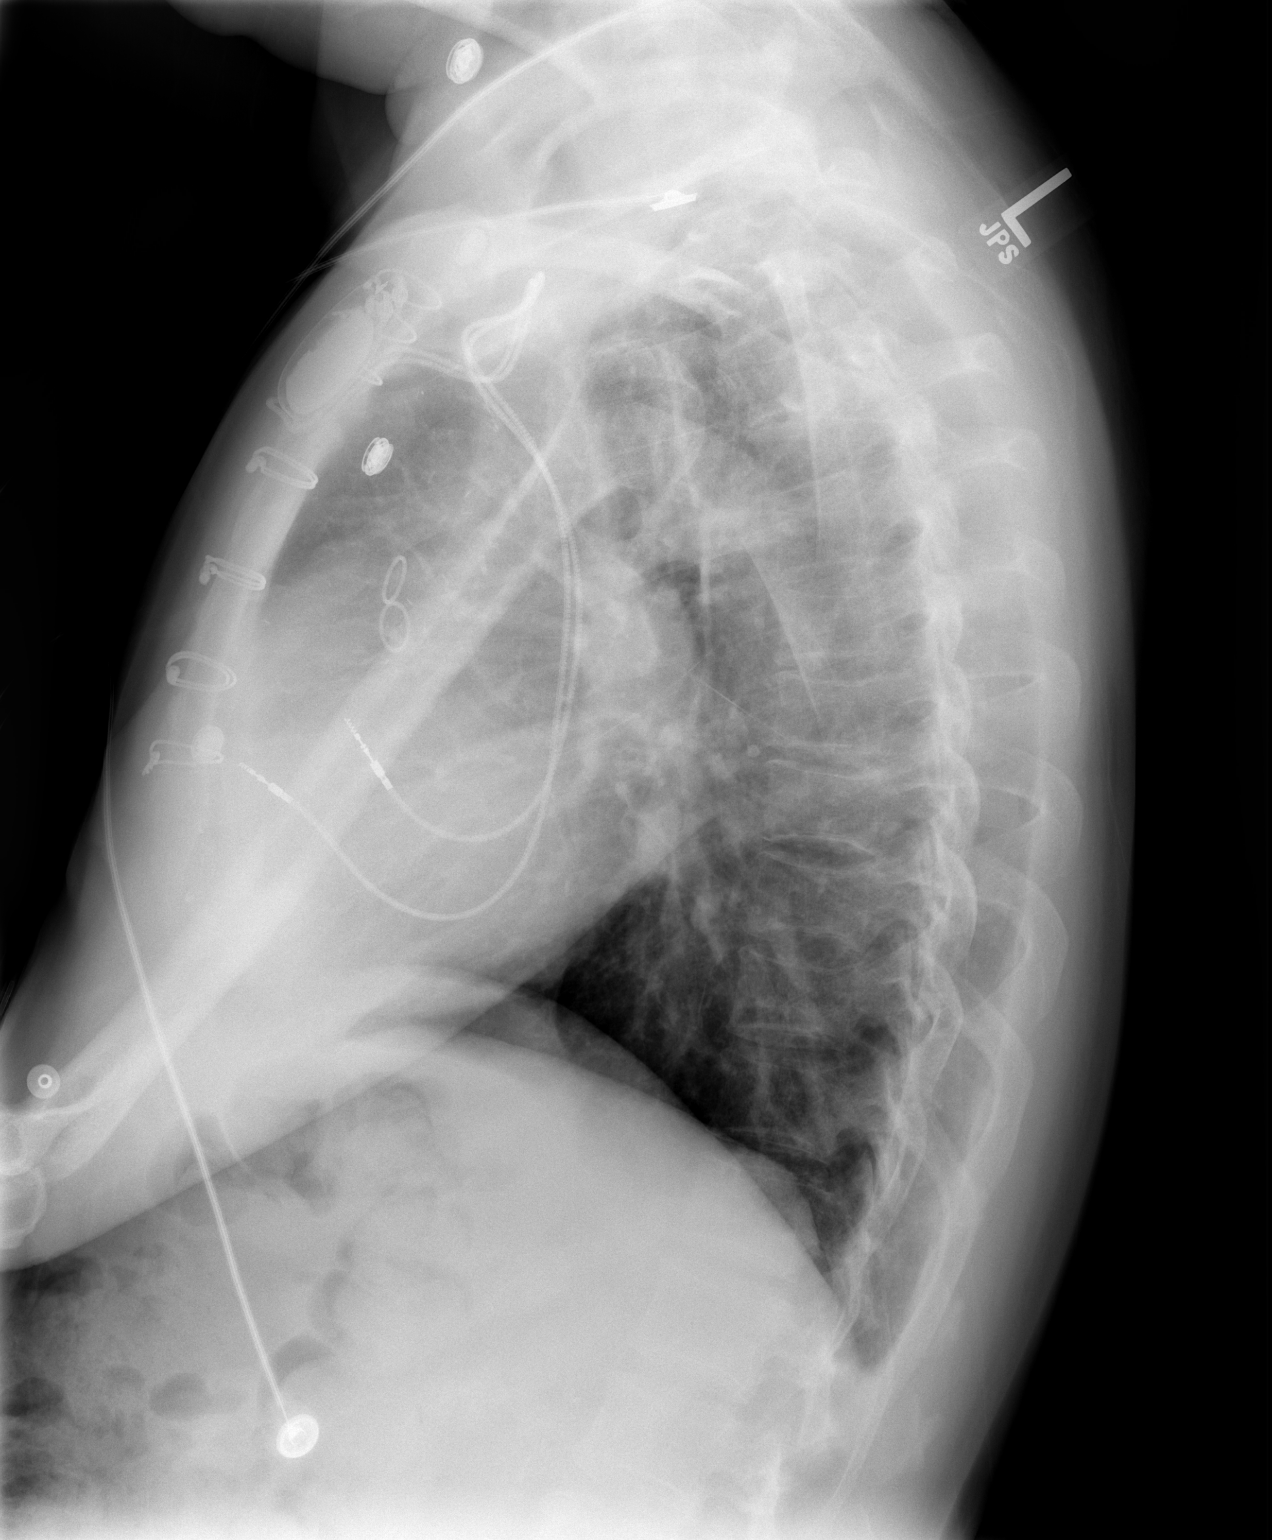

[2 of 2 positions shown; findings below may reference images not displayed]

FINDINGS: Two views of the chest demonstrate a left cardiac
pacemaker.  The leads of the pacer overlying the region of the
right atrium and right ventricle.  No evidence to suggest a
pneumothorax.  The lungs are essentially clear.  The patient is
status post a median sternotomy and CABG procedure.  No evidence
for pulmonary edema or pleural effusions.  Cardiac silhouette is
stable.  Trachea is midline.  Again seen are prominent paratracheal
densities in the superior mediastinum.  Findings could be related
to thyroid disease.
IMPRESSION: Status post left cardiac pacemaker placement.  Negative for
pneumothorax.

## 2011-08-19 ENCOUNTER — Encounter: Payer: Self-pay | Admitting: Internal Medicine

## 2011-08-19 ENCOUNTER — Encounter: Payer: Self-pay | Admitting: *Deleted

## 2011-08-19 ENCOUNTER — Ambulatory Visit (INDEPENDENT_AMBULATORY_CARE_PROVIDER_SITE_OTHER): Payer: BC Managed Care – PPO | Admitting: *Deleted

## 2011-08-19 DIAGNOSIS — I498 Other specified cardiac arrhythmias: Secondary | ICD-10-CM

## 2011-08-19 DIAGNOSIS — Z950 Presence of cardiac pacemaker: Secondary | ICD-10-CM | POA: Insufficient documentation

## 2011-08-19 LAB — PACEMAKER DEVICE OBSERVATION
AL IMPEDENCE PM: 564 Ohm
AL THRESHOLD: 0.5 V
ATRIAL PACING PM: 77
BAMS-0001: 175 {beats}/min
BATTERY VOLTAGE: 2.79 V
RV LEAD AMPLITUDE: 15.67 mv
RV LEAD IMPEDENCE PM: 490 Ohm
RV LEAD THRESHOLD: 0.75 V
VENTRICULAR PACING PM: 0

## 2011-08-19 NOTE — Progress Notes (Signed)
PPM check 

## 2011-09-03 ENCOUNTER — Ambulatory Visit (INDEPENDENT_AMBULATORY_CARE_PROVIDER_SITE_OTHER): Payer: BC Managed Care – PPO | Admitting: Internal Medicine

## 2011-09-03 ENCOUNTER — Encounter: Payer: Self-pay | Admitting: Internal Medicine

## 2011-09-03 ENCOUNTER — Other Ambulatory Visit (INDEPENDENT_AMBULATORY_CARE_PROVIDER_SITE_OTHER): Payer: BC Managed Care – PPO

## 2011-09-03 VITALS — BP 142/78 | HR 75 | Temp 98.0°F | Ht 64.0 in | Wt 164.0 lb

## 2011-09-03 DIAGNOSIS — E119 Type 2 diabetes mellitus without complications: Secondary | ICD-10-CM

## 2011-09-03 DIAGNOSIS — Z0000 Encounter for general adult medical examination without abnormal findings: Secondary | ICD-10-CM

## 2011-09-03 DIAGNOSIS — E785 Hyperlipidemia, unspecified: Secondary | ICD-10-CM

## 2011-09-03 DIAGNOSIS — R21 Rash and other nonspecific skin eruption: Secondary | ICD-10-CM | POA: Insufficient documentation

## 2011-09-03 DIAGNOSIS — I10 Essential (primary) hypertension: Secondary | ICD-10-CM

## 2011-09-03 LAB — HEMOGLOBIN A1C: Hgb A1c MFr Bld: 6.1 % (ref 4.6–6.5)

## 2011-09-03 LAB — BASIC METABOLIC PANEL
BUN: 21 mg/dL (ref 6–23)
CO2: 27 mEq/L (ref 19–32)
Calcium: 10.3 mg/dL (ref 8.4–10.5)
Chloride: 101 mEq/L (ref 96–112)
Creatinine, Ser: 0.9 mg/dL (ref 0.4–1.5)
GFR: 89.95 mL/min (ref >60.00)
Glucose, Bld: 112 mg/dL — ABNORMAL HIGH (ref 70–99)
Potassium: 4.6 mEq/L (ref 3.5–5.1)
Sodium: 137 mEq/L (ref 135–145)

## 2011-09-03 LAB — LIPID PANEL
Cholesterol: 170 mg/dL (ref 0–200)
HDL: 59.7 mg/dL (ref >39.00)
Total CHOL/HDL Ratio: 3
Triglycerides: 330 mg/dL — ABNORMAL HIGH (ref 0.0–149.0)
VLDL: 66 mg/dL — ABNORMAL HIGH (ref 0.0–40.0)

## 2011-09-03 LAB — LDL CHOLESTEROL, DIRECT: Direct LDL: 64.1 mg/dL

## 2011-09-03 MED ORDER — TRIAMCINOLONE ACETONIDE 0.1 % EX CREA: TOPICAL_CREAM | Freq: Two times a day (BID) | CUTANEOUS | Status: DC

## 2011-09-03 MED ORDER — LANCETS MISC: 1.0000 "application " | Freq: Every day | Status: AC

## 2011-09-03 MED ORDER — GLUCOSE BLOOD VI STRP: ORAL_STRIP | Status: AC

## 2011-09-03 NOTE — Patient Instructions (Addendum)
Take all new medications as prescribed - the cream for the rash on the arms You are given the glucometer and prescription for supplies today (strips and lancets - to the pharmacy) You are referred for Diabetes education  Please check your blood sugars twice per week to monitor for any increase over 150 Please make appointment if this occurs, where the sugars are consistently greater the 150  Continue all other medications as before Please have the pharmacy call with any refills you may need. Please continue your efforts at being more active, low cholesterol/diabetic diet, and weight control. Please keep your appointments with your specialists as you have planned - cardiology Please go to LAB in the Basement for the blood and/or urine tests to be done today You will be contacted by phone if any changes need to be made immediately.  Otherwise, you will receive a letter about your results with an explanation. Please return in 6 mo with Lab testing done 3-5 days before

## 2011-09-04 ENCOUNTER — Encounter: Payer: Self-pay | Admitting: Internal Medicine

## 2011-09-04 NOTE — Assessment & Plan Note (Signed)
stable overall by hx and exam, most recent data reviewed with pt, and pt to continue medical treatment as before Lab Results  Component Value Date   LDLCALC 48 12/28/2010

## 2011-09-04 NOTE — Assessment & Plan Note (Addendum)
stable overall by hx and exam, most recent data reviewed with pt, and pt to continue medical treatment as before, to check cbg's and a1c today, for DM education Lab Results  Component Value Date   HGBA1C 6.1 09/03/2011

## 2011-09-04 NOTE — Assessment & Plan Note (Signed)
stable overall by hx and exam, most recent data reviewed with pt, and pt to continue medical treatment as before BP Readings from Last 3 Encounters:  09/03/11 142/78  04/23/11 129/80  03/17/11 112/60

## 2011-09-04 NOTE — Assessment & Plan Note (Signed)
Mild to mod, for topical steroid course prn use,  to f/u any worsening symptoms or concerns, consider derm if not improved

## 2011-09-04 NOTE — Progress Notes (Signed)
Subjective:    Patient ID: [name] [name], male    DOB: 05/26/1955, 56 y.o.   MRN: [id number]  HPI  Here to f/u; overall doing ok,  Pt denies chest pain, increased sob or doe, wheezing, orthopnea, PND, increased K[place] swelling, palpitations, dizziness or syncope.  Pt denies new neurological symptoms such as new headache, or facial or extremity weakness or numbness   Pt denies polydipsia, polyuria, or low sugar symptoms such as weakness or confusion improved with po intake.  Pt states overall good compliance with meds, trying to follow lower cholesterol, diabetic diet, wt overall stable but little exercise however  Does have rash to the arms with itching, scratching and excoriations noted today.   Pt denies fever, wt loss, night sweats, loss of appetite, or other constitutional symptoms   Past Medical History  Diagnosis Date  . CAD (coronary artery disease)     The patient presented in Aug 2008 with acute cornary syndrome. He did have a bypass surgeru. He did have a 3-vessel disease on heart cathiterization and had bypass surgery with LIMA to the LAD, sequential vein graft to the obtuse marginal - 1 and obtuse marginal -2, and vein graft to the RCA. Adenosine myoview (11/10): EF 64%, normal wall motion, normal perfusion.   [name] History of echocardiogram     November 2008. EF 65-70%. no regional wall abnormalities. Trivial mitral regurgitation.  [name] GERD (gastroesophageal reflux disease)   . HTN (hypertension)   . Hyperlipidemia   . Sick sinus syndrome     with presyncope. medtronic dual chamber PCM placed 11/10  . Type II or unspecified type diabetes mellitus without mention of complication, not stated as uncontrolled   . Mild anemia     Hx of post op. 8/08  . Paroxysmal atrial fibrillation     only episode noted breifly during hospitalization for PCM placement 11/10.    Past Surgical History  Procedure Date  . Coronary artery bypass graft   . Rectal abcess     2005. Dr. Carolynne Edouard with interanal sphincterotomy   . Pacemaker insertion     MDT implanted 2010    reports that he quit smoking about 5 years ago. He has never used smokeless tobacco. He reports that he drinks about 1.2 ounces of alcohol per week. He reports that he does not use illicit drugs. family history is negative for Colon cancer. No Known Allergies Current Outpatient Prescriptions on File Prior to Visit  Medication Sig Dispense Refill  . acetaminophen (TYLENOL) 325 MG tablet Take 650 mg by mouth every 6 (six) hours as needed.        [name] amLODipine (NORVASC) 5 MG tablet Take 1 tablet (5 mg total) by mouth daily.  90 tablet  3  . aspirin 325 MG tablet Take 325 mg by mouth daily.        [name] atorvastatin (LIPITOR) 40 MG tablet Take 1 tablet (40 mg total) by mouth daily.  90 tablet  3  . lisinopril (PRINIVIL,ZESTRIL) 40 MG tablet TAKE 1 TABLET BY MOUTH ONCE DAILY  30 tablet  5  . metoprolol (LOPRESSOR) 50 MG tablet Take 1 tablet (50 mg total) by mouth 2 (two) times daily.  180 tablet  3  . Omega-3 Fatty Acids (OMEGA-3 FISH OIL) 1200 MG CAPS Take by mouth 2 (two) times daily.        [name] omeprazole (PRILOSEC) 20 MG capsule TAKE 2 CAPSULES BY MOUTH ONCE DAILY  180 capsule  3  . hydrocortisone-pramoxine (ANALPRAM-HC)  2.5-1 % rectal cream Place 1 application rectally Daily.       . meloxicam (MOBIC) 15 MG tablet Take 1 tablet (15 mg total) by mouth daily.  30 tablet  5   Review of Systems Constitutional: Negative for diaphoresis and unexpected weight change.  HENT: Negative for tinnitus.   Eyes: Negative for photophobia and visual disturbance.  Respiratory: Negative for choking and stridor.   Gastrointestinal: Negative for vomiting and blood in stool.  Genitourinary: Negative for hematuria and decreased urine volume.  Musculoskeletal: Negative for gait problem.  Skin: Negative for color change and wound.  Neurological: Negative for tremors and numbness.  Psychiatric/Behavioral: Negative for decreased concentration. The patient is not  hyperactive.     Objective:   Physical Exam BP 142/78  Pulse 75  Temp 98 F (36.7 C) (Oral)  Ht 5\' 4"  (1.626 m)  Wt 164 lb (74.39 kg)  BMI 28.15 kg/m2  SpO2 95% Physical Exam  VS noted, not ill appearing Constitutional: Pt appears well-developed and well-nourished.  HENT: Head: Normocephalic.  Right Ear: External ear normal.  Left Ear: External ear normal.  Eyes: Conjunctivae and EOM are normal. Pupils are equal, round, and reactive to light.  Neck: Normal range of motion. Neck supple.  Cardiovascular: Normal rate and regular rhythm.   Pulmonary/Chest: Effort normal and breath sounds normal.  Neurological: Pt is alert. Not confused Skin: Skin is warm. No erythema. has numerous small lesions and excoriations to arms  Psychiatric: Pt behavior is normal. Thought content normal.     Assessment & Plan:

## 2011-10-11 ENCOUNTER — Other Ambulatory Visit: Payer: Self-pay | Admitting: Internal Medicine

## 2011-10-11 ENCOUNTER — Other Ambulatory Visit: Payer: Self-pay

## 2011-10-11 MED ORDER — OMEPRAZOLE 20 MG PO CPDR: 20.0000 mg | DELAYED_RELEASE_CAPSULE | Freq: Two times a day (BID) | ORAL | Status: DC

## 2011-10-29 ENCOUNTER — Other Ambulatory Visit: Payer: Self-pay | Admitting: Internal Medicine

## 2011-11-08 ENCOUNTER — Other Ambulatory Visit: Payer: Self-pay

## 2011-11-08 ENCOUNTER — Telehealth: Payer: Self-pay | Admitting: Internal Medicine

## 2011-11-08 DIAGNOSIS — I10 Essential (primary) hypertension: Secondary | ICD-10-CM

## 2011-11-08 MED ORDER — LISINOPRIL 40 MG PO TABS: 40.0000 mg | ORAL_TABLET | Freq: Every day | ORAL | Status: DC

## 2011-11-08 MED ORDER — AMLODIPINE BESYLATE 5 MG PO TABS: 5.0000 mg | ORAL_TABLET | Freq: Every day | ORAL | Status: DC

## 2011-11-08 MED ORDER — METOPROLOL TARTRATE 50 MG PO TABS: 50.0000 mg | ORAL_TABLET | Freq: Two times a day (BID) | ORAL | Status: DC

## 2011-11-08 MED ORDER — ATORVASTATIN CALCIUM 40 MG PO TABS: 40.0000 mg | ORAL_TABLET | Freq: Every day | ORAL | Status: DC

## 2011-11-08 NOTE — Telephone Encounter (Signed)
Prescriptions have been sent in today 11/08/11.

## 2011-11-08 NOTE — Telephone Encounter (Signed)
Caller: Tristyn/Patient; Patient Name: [name] [name]; PCP: Oliver Barre (Adults only); Best Callback Phone Number: [phone]   Yechiel Erny states he is going on a  trip in November. Will be gone x 2 months. Needs enough  refills called in to pharmacy to  last him  until January 2014. States he has already spoke with someone in office. Per EPIC refills for Lopressor, Lisinopril, Atorvastatin and Amlodipine were called in to [facility] at [address]. No further needs at this time.

## 2011-12-15 ENCOUNTER — Ambulatory Visit: Payer: BC Managed Care – PPO | Admitting: Cardiology

## 2012-02-18 ENCOUNTER — Ambulatory Visit: Payer: BC Managed Care – PPO | Admitting: Internal Medicine

## 2012-02-23 ENCOUNTER — Ambulatory Visit: Payer: BC Managed Care – PPO | Admitting: Internal Medicine

## 2012-02-29 ENCOUNTER — Ambulatory Visit (INDEPENDENT_AMBULATORY_CARE_PROVIDER_SITE_OTHER): Payer: BC Managed Care – PPO

## 2012-02-29 ENCOUNTER — Ambulatory Visit (INDEPENDENT_AMBULATORY_CARE_PROVIDER_SITE_OTHER): Payer: BC Managed Care – PPO | Admitting: Internal Medicine

## 2012-02-29 ENCOUNTER — Encounter: Payer: Self-pay | Admitting: Internal Medicine

## 2012-02-29 VITALS — BP 110/60 | HR 72 | Temp 98.5°F | Wt 162.4 lb

## 2012-02-29 DIAGNOSIS — E119 Type 2 diabetes mellitus without complications: Secondary | ICD-10-CM

## 2012-02-29 DIAGNOSIS — Z0000 Encounter for general adult medical examination without abnormal findings: Secondary | ICD-10-CM

## 2012-02-29 LAB — CBC WITH DIFFERENTIAL/PLATELET
Basophils Absolute: 0 10*3/uL (ref 0.0–0.1)
Basophils Relative: 0.6 % (ref 0.0–3.0)
Eosinophils Absolute: 0.3 10*3/uL (ref 0.0–0.7)
Eosinophils Relative: 3.6 % (ref 0.0–5.0)
HCT: 39.7 % (ref 39.0–52.0)
Hemoglobin: 13.3 g/dL (ref 13.0–17.0)
Lymphocytes Relative: 27.4 % (ref 12.0–46.0)
Lymphs Abs: 2.2 10*3/uL (ref 0.7–4.0)
MCHC: 33.5 g/dL (ref 30.0–36.0)
MCV: 94.1 fl (ref 78.0–100.0)
Monocytes Absolute: 0.8 10*3/uL (ref 0.1–1.0)
Monocytes Relative: 9.6 % (ref 3.0–12.0)
Neutro Abs: 4.8 10*3/uL (ref 1.4–7.7)
Neutrophils Relative %: 58.8 % (ref 43.0–77.0)
Platelets: 270 10*3/uL (ref 150.0–400.0)
RBC: 4.22 Mil/uL (ref 4.22–5.81)
RDW: 12.6 % (ref 11.5–14.6)
WBC: 8.1 10*3/uL (ref 4.5–10.5)

## 2012-02-29 LAB — URINALYSIS, ROUTINE W REFLEX MICROSCOPIC
Bilirubin Urine: NEGATIVE
Hgb urine dipstick: NEGATIVE
Ketones, ur: NEGATIVE
Leukocytes, UA: NEGATIVE
Nitrite: NEGATIVE
Specific Gravity, Urine: >=1.03 (ref 1.000–1.030)
Total Protein, Urine: NEGATIVE
Urine Glucose: NEGATIVE
Urobilinogen, UA: 0.2 (ref 0.0–1.0)
pH: 6 (ref 5.0–8.0)

## 2012-02-29 LAB — HEMOGLOBIN A1C: Hgb A1c MFr Bld: 6.4 % (ref 4.6–6.5)

## 2012-02-29 LAB — MICROALBUMIN / CREATININE URINE RATIO
Creatinine,U: 141.1 mg/dL
Microalb Creat Ratio: 0.4 mg/g (ref 0.0–30.0)
Microalb, Ur: 0.5 mg/dL (ref 0.0–1.9)

## 2012-02-29 NOTE — Patient Instructions (Addendum)
Please continue all other medications as before, and refills have been done if requested. Please have the pharmacy call with any other refills you may need. Please keep your appointments with your specialists as you have planned - Dr Shirlee Latch, and Dr Allred Please go to the LAB in the Basement (turn left off the elevator) for the tests to be done today You will be contacted by phone if any changes need to be made immediately.  Otherwise, you will receive a letter about your results with an explanation, but please check with MyChart first. Thank you for enrolling in MyChart. Please follow the instructions below to securely access your online medical record. MyChart allows you to send messages to your doctor, view your test results, renew your prescriptions, schedule appointments, and more. To Log into My Chart online, please go by [employer] or [employer] to [email or web address], or download the MyChart App from the [employer] .  Your Username is: [email or web address] (Password: [email or web address]) Please return in 6 months, or sooner if needed

## 2012-02-29 NOTE — Assessment & Plan Note (Signed)

## 2012-02-29 NOTE — Assessment & Plan Note (Signed)
stable overall by history and exam, recent data reviewed with pt, and pt to continue medical treatment as before,  to f/u any worsening symptoms or concerns Lab Results  Component Value Date   HGBA1C 6.1 09/03/2011

## 2012-02-29 NOTE — Progress Notes (Signed)
Subjective:    Patient ID: [name] [name], male    DOB: 1955/03/19, 57 y.o.   MRN: [id number]  HPI  Here for wellness and f/u;  Overall doing ok;  Pt denies CP, worsening SOB, DOE, wheezing, orthopnea, PND, worsening [place] edema, palpitations, dizziness or syncope.  Pt denies neurological change such as new headache, facial or extremity weakness.  Pt denies polydipsia, polyuria, or low sugar symptoms. Pt states overall good compliance with treatment and medications, good tolerability, and has been trying to follow lower cholesterol diet.  Pt denies worsening depressive symptoms, suicidal ideation or panic. No fever, night sweats, wt loss, loss of appetite, or other constitutional symptoms.  Pt states good ability with ADL's, has low fall risk, home safety reviewed and adequate, no other significant changes in hearing or vision, and only occasionally active with exercise.  No new complaints  Wt overall down 4 lbs in last yr.  Due for cardiology f/u - missed prior appt, has yet to call to re-schedule Past Medical History  Diagnosis Date  . CAD (coronary artery disease)     The patient presented in Aug 2008 with acute cornary syndrome. He did have a bypass surgeru. He did have a 3-vessel disease on heart cathiterization and had bypass surgery with LIMA to the LAD, sequential vein graft to the obtuse marginal - 1 and obtuse marginal -2, and vein graft to the RCA. Adenosine myoview (11/10): EF 64%, normal wall motion, normal perfusion.   [name] History of echocardiogram     November 2008. EF 65-70%. no regional wall abnormalities. Trivial mitral regurgitation.  [name] GERD (gastroesophageal reflux disease)   . HTN (hypertension)   . Hyperlipidemia   . Sick sinus syndrome     with presyncope. medtronic dual chamber PCM placed 11/10  . Type II or unspecified type diabetes mellitus without mention of complication, not stated as uncontrolled   . Mild anemia     Hx of post op. 8/08  . Paroxysmal atrial fibrillation     only  episode noted breifly during hospitalization for PCM placement 11/10.    Past Surgical History  Procedure Laterality Date  . Coronary artery bypass graft    . Rectal abcess      2005. Dr. Carolynne Edouard with interanal sphincterotomy  . Pacemaker insertion      MDT implanted 2010    reports that he quit smoking about 6 years ago. He has never used smokeless tobacco. He reports that he drinks about 1.2 ounces of alcohol per week. He reports that he does not use illicit drugs. family history is negative for Colon cancer. No Known Allergies Current Outpatient Prescriptions on File Prior to Visit  Medication Sig Dispense Refill  . amLODipine (NORVASC) 5 MG tablet Take 1 tablet (5 mg total) by mouth daily.  90 tablet  3  . aspirin 325 MG tablet Take 325 mg by mouth daily.        [name] atorvastatin (LIPITOR) 40 MG tablet Take 1 tablet (40 mg total) by mouth daily.  90 tablet  3  . glucose blood test strip Use as instructed  100 each  12  . Lancets MISC 1 application by Does not apply route daily.  100 each  11  . lisinopril (PRINIVIL,ZESTRIL) 40 MG tablet Take 1 tablet (40 mg total) by mouth daily.  90 tablet  3  . meloxicam (MOBIC) 15 MG tablet TAKE 1 TABLET BY MOUTH EVERY DAY  30 tablet  8  . metoprolol (LOPRESSOR) 50  MG tablet Take 1 tablet (50 mg total) by mouth 2 (two) times daily.  180 tablet  3  . Omega-3 Fatty Acids (OMEGA-3 FISH OIL) 1200 MG CAPS Take by mouth 2 (two) times daily.        [name] omeprazole (PRILOSEC) 20 MG capsule Take 1 capsule (20 mg total) by mouth 2 (two) times daily.  180 capsule  3   No current facility-administered medications on file prior to visit.   Review of Systems Constitutional: Negative for diaphoresis, activity change, appetite change or unexpected weight change.  HENT: Negative for hearing loss, ear pain, facial swelling, mouth sores and neck stiffness.   Eyes: Negative for pain, redness and visual disturbance.  Respiratory: Negative for shortness of breath and  wheezing.   Cardiovascular: Negative for chest pain and palpitations.  Gastrointestinal: Negative for diarrhea, blood in stool, abdominal distention or other pain Genitourinary: Negative for hematuria, flank pain or change in urine volume.  Musculoskeletal: Negative for myalgias and joint swelling.  Skin: Negative for color change and wound.  Neurological: Negative for syncope and numbness. other than noted Hematological: Negative for adenopathy.  Psychiatric/Behavioral: Negative for hallucinations, self-injury, decreased concentration and agitation.      Objective:   Physical Exam BP 110/60  Pulse 72  Temp(Src) 98.5 F (36.9 C) (Oral)  Wt 162 lb 6.4 oz (73.664 kg)  BMI 27.86 kg/m2  SpO2 95% VS noted,  Constitutional: Pt is oriented to person, place, and time. Appears well-developed and well-nourished.  Head: Normocephalic and atraumatic.  Right Ear: External ear normal.  Left Ear: External ear normal.  Nose: Nose normal.  Mouth/Throat: Oropharynx is clear and moist.  Eyes: Conjunctivae and EOM are normal. Pupils are equal, round, and reactive to light.  Neck: Normal range of motion. Neck supple. No JVD present. No tracheal deviation present.  Cardiovascular: Normal rate, regular rhythm, normal heart sounds and intact distal pulses.   Pulmonary/Chest: Effort normal and breath sounds normal.  Abdominal: Soft. Bowel sounds are normal. There is no tenderness. No HSM  Musculoskeletal: Normal range of motion. Exhibits no edema.  Lymphadenopathy:  Has no cervical adenopathy.  Neurological: Pt is alert and oriented to person, place, and time. Pt has normal reflexes. No cranial nerve deficit.  Skin: Skin is warm and dry. No rash noted.  Psychiatric:  Has  normal mood and affect. Behavior is normal.     Assessment & Plan:

## 2012-03-01 LAB — BASIC METABOLIC PANEL
BUN: 29 mg/dL — ABNORMAL HIGH (ref 6–23)
CO2: 28 mEq/L (ref 19–32)
Calcium: 10.2 mg/dL (ref 8.4–10.5)
Chloride: 106 mEq/L (ref 96–112)
Creatinine, Ser: 1.2 mg/dL (ref 0.4–1.5)
GFR: 68.04 mL/min (ref >60.00)
Glucose, Bld: 128 mg/dL — ABNORMAL HIGH (ref 70–99)
Potassium: 5.1 mEq/L (ref 3.5–5.1)
Sodium: 142 mEq/L (ref 135–145)

## 2012-03-01 LAB — LIPID PANEL
Cholesterol: 154 mg/dL (ref 0–200)
HDL: 52.3 mg/dL (ref >39.00)
Total CHOL/HDL Ratio: 3
Triglycerides: 232 mg/dL — ABNORMAL HIGH (ref 0.0–149.0)
VLDL: 46.4 mg/dL — ABNORMAL HIGH (ref 0.0–40.0)

## 2012-03-01 LAB — HEPATIC FUNCTION PANEL
ALT: 36 U/L (ref 0–53)
AST: 26 U/L (ref 0–37)
Albumin: 4.1 g/dL (ref 3.5–5.2)
Alkaline Phosphatase: 39 U/L (ref 39–117)
Bilirubin, Direct: 0.1 mg/dL (ref 0.0–0.3)
Total Bilirubin: 0.5 mg/dL (ref 0.3–1.2)
Total Protein: 7.4 g/dL (ref 6.0–8.3)

## 2012-03-01 LAB — LDL CHOLESTEROL, DIRECT: Direct LDL: 62.9 mg/dL

## 2012-03-01 LAB — PSA: PSA: 0.24 ng/mL (ref 0.10–4.00)

## 2012-03-01 LAB — TSH: TSH: 1.76 u[IU]/mL (ref 0.35–5.50)

## 2012-05-22 ENCOUNTER — Ambulatory Visit (INDEPENDENT_AMBULATORY_CARE_PROVIDER_SITE_OTHER): Payer: BC Managed Care – PPO | Admitting: Internal Medicine

## 2012-05-22 ENCOUNTER — Encounter: Payer: Self-pay | Admitting: Internal Medicine

## 2012-05-22 VITALS — BP 124/70 | HR 67 | Ht 63.0 in | Wt 164.0 lb

## 2012-05-22 DIAGNOSIS — I498 Other specified cardiac arrhythmias: Secondary | ICD-10-CM

## 2012-05-22 DIAGNOSIS — I4891 Unspecified atrial fibrillation: Secondary | ICD-10-CM

## 2012-05-22 DIAGNOSIS — I2510 Atherosclerotic heart disease of native coronary artery without angina pectoris: Secondary | ICD-10-CM

## 2012-05-22 LAB — PACEMAKER DEVICE OBSERVATION
AL IMPEDENCE PM: 548 Ohm
AL THRESHOLD: 0.5 V
ATRIAL PACING PM: 78
BAMS-0001: 175 {beats}/min
BATTERY VOLTAGE: 2.79 V
DEVICE MODEL PM: 225114
RV LEAD AMPLITUDE: 8 mv
RV LEAD IMPEDENCE PM: 511 Ohm
RV LEAD THRESHOLD: 0.75 V
VENTRICULAR PACING PM: 0

## 2012-05-22 NOTE — Patient Instructions (Addendum)
Your physician wants you to follow-up in: 6 months with device clinic and 12 months with Dr Allred You will receive a reminder letter in the mail two months in advance. If you don't receive a letter, please call our office to schedule the follow-up appointment.  

## 2012-05-22 NOTE — Progress Notes (Signed)
PCP: Oliver Barre, MD Primary Cardiologist: Marca Ancona, MD  [name] [name] is a 57 y.o. male who presents today for routine electrophysiology followup.  Since last being seen in our clinic, the patient reports doing very well.  Today, he denies symptoms of palpitations, chest pain, shortness of breath,  lower extremity edema, dizziness, presyncope, or syncope.  The patient is otherwise without complaint today.   Past Medical History  Diagnosis Date  . CAD (coronary artery disease)     The patient presented in Aug 2008 with acute cornary syndrome. He did have a bypass surgeru. He did have a 3-vessel disease on heart cathiterization and had bypass surgery with LIMA to the LAD, sequential vein graft to the obtuse marginal - 1 and obtuse marginal -2, and vein graft to the RCA. Adenosine myoview (11/10): EF 64%, normal wall motion, normal perfusion.   [name] History of echocardiogram     November 2008. EF 65-70%. no regional wall abnormalities. Trivial mitral regurgitation.  [name] GERD (gastroesophageal reflux disease)   . HTN (hypertension)   . Hyperlipidemia   . Sick sinus syndrome     with presyncope. medtronic dual chamber PCM placed 11/10  . Type II or unspecified type diabetes mellitus without mention of complication, not stated as uncontrolled   . Mild anemia     Hx of post op. 8/08  . Paroxysmal atrial fibrillation     only episode noted breifly during hospitalization for PCM placement 11/10.    Past Surgical History  Procedure Laterality Date  . Coronary artery bypass graft    . Rectal abcess      2005. Dr. Carolynne Edouard with interanal sphincterotomy  . Pacemaker insertion      MDT implanted 2010    Current Outpatient Prescriptions  Medication Sig Dispense Refill  . amLODipine (NORVASC) 5 MG tablet Take 1 tablet (5 mg total) by mouth daily.  90 tablet  3  . aspirin 325 MG tablet Take 325 mg by mouth daily.        [name] atorvastatin (LIPITOR) 40 MG tablet Take 1 tablet (40 mg total) by mouth daily.  90  tablet  3  . glucose blood test strip Use as instructed  100 each  12  . Lancets MISC 1 application by Does not apply route daily.  100 each  11  . lisinopril (PRINIVIL,ZESTRIL) 40 MG tablet Take 1 tablet (40 mg total) by mouth daily.  90 tablet  3  . meloxicam (MOBIC) 15 MG tablet TAKE 1 TABLET BY MOUTH EVERY DAY  30 tablet  8  . metoprolol (LOPRESSOR) 50 MG tablet Take 1 tablet (50 mg total) by mouth 2 (two) times daily.  180 tablet  3  . Omega-3 Fatty Acids (OMEGA-3 FISH OIL) 1200 MG CAPS Take by mouth 2 (two) times daily.        [name] omeprazole (PRILOSEC) 20 MG capsule Take 1 capsule (20 mg total) by mouth 2 (two) times daily.  180 capsule  3   No current facility-administered medications for this visit.    Physical Exam: Filed Vitals:   05/22/12 1527  BP: 124/70  Pulse: 67  Height: 5\' 3"  (1.6 m)  Weight: 164 lb (74.39 kg)  SpO2: 98%    GEN- The patient is well appearing, alert and oriented x 3 today.   Head- normocephalic, atraumatic Eyes-  Sclera clear, conjunctiva pink Ears- hearing intact Oropharynx- clear Lungs- Clear to ausculation bilaterally, normal work of breathing Chest- pacemaker pocket is well healed Heart-  Regular rate and rhythm, no murmurs, rubs or gallops, PMI not laterally displaced GI- soft, NT, ND, + BS Extremities- no clubbing, cyanosis, or edema  Pacemaker interrogation- reviewed in detail today,  See PACEART report  Assessment and Plan:  1. Bradycardia Normal pacemaker function See Pace Art report No changes today   2. CAD No ischemic symptoms  Return to the device clinic in 6months

## 2012-06-07 ENCOUNTER — Ambulatory Visit (INDEPENDENT_AMBULATORY_CARE_PROVIDER_SITE_OTHER): Payer: BC Managed Care – PPO | Admitting: Cardiology

## 2012-06-07 ENCOUNTER — Encounter: Payer: Self-pay | Admitting: Cardiology

## 2012-06-07 VITALS — BP 111/65 | HR 68 | Ht 64.0 in | Wt 162.0 lb

## 2012-06-07 DIAGNOSIS — I10 Essential (primary) hypertension: Secondary | ICD-10-CM

## 2012-06-07 DIAGNOSIS — I498 Other specified cardiac arrhythmias: Secondary | ICD-10-CM

## 2012-06-07 DIAGNOSIS — I2510 Atherosclerotic heart disease of native coronary artery without angina pectoris: Secondary | ICD-10-CM

## 2012-06-07 DIAGNOSIS — E785 Hyperlipidemia, unspecified: Secondary | ICD-10-CM

## 2012-06-07 NOTE — Progress Notes (Signed)
Patient ID: [name] [name], male   DOB: [birth year], 57 y.o.   MRN: [id number] PCP: Dr. Jonny [name]  [name] is a 57 yo with male with history of CAD s/p CABG and bradycardia s/p Medtronic pacemaker who presents today for cardiology followup.  He says he has been doing well.  He is working full time as a Librarian, academic.  No exertional dyspnea or chest pain. No palpitations or lightheadedness.  BP has been reasonably well-controlled.    Labs (8/12): LDL 36, HDl 64, TGs 138, K 4.7, creatinine 1.0 Labs (2/14): K 5.1, creatinine 1.2, TSH normal, LDL 63  Past Medical History  Diagnosis Date  . CAD (coronary artery disease)     The patient presented in Aug 2008 with acute cornary syndrome. He did have a bypass surgeru. He did have a 3-vessel disease on heart cathiterization and had bypass surgery with LIMA to the LAD, sequential vein graft to the obtuse marginal - 1 and obtuse marginal -2, and vein graft to the RCA. Adenosine myoview (11/10): EF 64%, normal wall motion, normal perfusion.   [name] History of echocardiogram     November 2008. EF 65-70%. no regional wall abnormalities. Trivial mitral regurgitation.  [name] GERD (gastroesophageal reflux disease)   . HTN (hypertension)   . Hyperlipidemia   . Sick sinus syndrome     with presyncope. medtronic dual chamber PCM placed 11/10  . Type II or unspecified type diabetes mellitus without mention of complication, not stated as uncontrolled   . Mild anemia     Hx of post op. 8/08  . Paroxysmal atrial fibrillation     only episode noted breifly during hospitalization for PCM placement 11/10.     Current Outpatient Prescriptions  Medication Sig Dispense Refill  . amLODipine (NORVASC) 5 MG tablet Take 1 tablet (5 mg total) by mouth daily.  90 tablet  3  . aspirin 325 MG tablet Take 325 mg by mouth daily.        [name] atorvastatin (LIPITOR) 40 MG tablet Take 1 tablet (40 mg total) by mouth daily.  90 tablet  3  . glucose blood test strip Use as instructed  100 each  12  .  Lancets MISC 1 application by Does not apply route daily.  100 each  11  . lisinopril (PRINIVIL,ZESTRIL) 40 MG tablet Take 1 tablet (40 mg total) by mouth daily.  90 tablet  3  . meloxicam (MOBIC) 15 MG tablet TAKE 1 TABLET BY MOUTH EVERY DAY  30 tablet  8  . metoprolol (LOPRESSOR) 50 MG tablet Take 1 tablet (50 mg total) by mouth 2 (two) times daily.  180 tablet  3  . Omega-3 Fatty Acids (OMEGA-3 FISH OIL) 1200 MG CAPS Take by mouth 2 (two) times daily.        [name] omeprazole (PRILOSEC) 20 MG capsule Take 1 capsule (20 mg total) by mouth 2 (two) times daily.  180 capsule  3   No current facility-administered medications for this visit.     PHYSICAL EXAM: Filed Vitals:   06/07/12 1546  BP: 111/65  Pulse: 68   General:  Well appearing. No resp difficulty HEENT: normal Neck: supple. JVP flat. Carotids 2+ bilaterally; no bruits. No lymphadenopathy or thryomegaly appreciated. Cor: PMI normal. Regular rate & rhythm. No rubs, gallops or murmurs. Lungs: clear Abdomen: soft, nontender, nondistended. No hepatosplenomegaly. No bruits or masses. Good bowel sounds. Extremities: no cyanosis, clubbing, rash, edema Neuro: alert & orientedx3, cranial nerves grossly intact. Moves all  4 extremities w/o difficulty. Affect pleasant.  Assessment/Plan: 1. CAD: Status post CABG.  EF preserved by 2008 echo.  No ischemic symptoms.  Continue ASA, statin, lisinopril, and metoprolol. 2. Bradycardia: Has PCM and regular followup with Dr. Johney [name].  3. HTN: BP is under good control. 4. Hyperlipidemia: Good lipids in 2/14.  Continue statin.   [name] [name] 06/07/2012

## 2012-08-29 ENCOUNTER — Ambulatory Visit (INDEPENDENT_AMBULATORY_CARE_PROVIDER_SITE_OTHER): Payer: BC Managed Care – PPO | Admitting: Internal Medicine

## 2012-08-29 ENCOUNTER — Encounter: Payer: Self-pay | Admitting: Internal Medicine

## 2012-08-29 ENCOUNTER — Ambulatory Visit (INDEPENDENT_AMBULATORY_CARE_PROVIDER_SITE_OTHER): Payer: BC Managed Care – PPO

## 2012-08-29 VITALS — BP 130/80 | HR 77 | Temp 99.7°F | Ht 63.0 in | Wt 157.0 lb

## 2012-08-29 DIAGNOSIS — E119 Type 2 diabetes mellitus without complications: Secondary | ICD-10-CM

## 2012-08-29 DIAGNOSIS — Z0000 Encounter for general adult medical examination without abnormal findings: Secondary | ICD-10-CM

## 2012-08-29 DIAGNOSIS — E785 Hyperlipidemia, unspecified: Secondary | ICD-10-CM

## 2012-08-29 DIAGNOSIS — I10 Essential (primary) hypertension: Secondary | ICD-10-CM

## 2012-08-29 DIAGNOSIS — M25519 Pain in unspecified shoulder: Secondary | ICD-10-CM

## 2012-08-29 DIAGNOSIS — M25511 Pain in right shoulder: Secondary | ICD-10-CM

## 2012-08-29 LAB — BASIC METABOLIC PANEL
BUN: 21 mg/dL (ref 6–23)
CO2: 27 mEq/L (ref 19–32)
Calcium: 9.9 mg/dL (ref 8.4–10.5)
Chloride: 101 mEq/L (ref 96–112)
Creatinine, Ser: 1.1 mg/dL (ref 0.4–1.5)
GFR: 75.3 mL/min (ref >60.00)
Glucose, Bld: 108 mg/dL — ABNORMAL HIGH (ref 70–99)
Potassium: 4.5 mEq/L (ref 3.5–5.1)
Sodium: 135 mEq/L (ref 135–145)

## 2012-08-29 LAB — HEPATIC FUNCTION PANEL
ALT: 29 U/L (ref 0–53)
AST: 26 U/L (ref 0–37)
Albumin: 4.1 g/dL (ref 3.5–5.2)
Alkaline Phosphatase: 42 U/L (ref 39–117)
Bilirubin, Direct: 0.1 mg/dL (ref 0.0–0.3)
Total Bilirubin: 0.6 mg/dL (ref 0.3–1.2)
Total Protein: 8.2 g/dL (ref 6.0–8.3)

## 2012-08-29 LAB — HEMOGLOBIN A1C: Hgb A1c MFr Bld: 6.6 % — ABNORMAL HIGH (ref 4.6–6.5)

## 2012-08-29 LAB — LIPID PANEL
Cholesterol: 165 mg/dL (ref 0–200)
HDL: 71.6 mg/dL (ref >39.00)
Total CHOL/HDL Ratio: 2
Triglycerides: 211 mg/dL — ABNORMAL HIGH (ref 0.0–149.0)
VLDL: 42.2 mg/dL — ABNORMAL HIGH (ref 0.0–40.0)

## 2012-08-29 NOTE — Assessment & Plan Note (Signed)
stable overall by history and exam, recent data reviewed with pt, and pt to continue medical treatment as before,  to f/u any worsening symptoms or concerns Lab Results  Component Value Date   LDLCALC 48 12/28/2010

## 2012-08-29 NOTE — Progress Notes (Signed)
Subjective:    Patient ID: [name] [name], male    DOB: 1955-03-24, 57 y.o.   MRN: [id number]  HPI  Here to f/u; overall doing ok,  Pt denies chest pain, increased sob or doe, wheezing, orthopnea, PND, increased Mctier swelling, palpitations, dizziness or syncope.  Pt denies polydipsia, polyuria, or low sugar symptoms such as weakness or confusion improved with po intake.  Pt denies new neurological symptoms such as new headache, or facial or extremity weakness or numbness.   Pt states overall good compliance with meds, has been trying to follow lower cholesterol, diabetic diet, with wt overall stable. C/o right shoulder pain ongoing since last visit, just not getting better, daily, mild to mod, not better with current meds, has not seen orthopedic. Past Medical History  Diagnosis Date  . CAD (coronary artery disease)     The patient presented in Aug 2008 with acute cornary syndrome. He did have a bypass surgeru. He did have a 3-vessel disease on heart cathiterization and had bypass surgery with LIMA to the LAD, sequential vein graft to the obtuse marginal - 1 and obtuse marginal -2, and vein graft to the RCA. Adenosine myoview (11/10): EF 64%, normal wall motion, normal perfusion.   [name] History of echocardiogram     November 2008. EF 65-70%. no regional wall abnormalities. Trivial mitral regurgitation.  [name] GERD (gastroesophageal reflux disease)   . HTN (hypertension)   . Hyperlipidemia   . Sick sinus syndrome     with presyncope. medtronic dual chamber PCM placed 11/10  . Type II or unspecified type diabetes mellitus without mention of complication, not stated as uncontrolled   . Mild anemia     Hx of post op. 8/08  . Paroxysmal atrial fibrillation     only episode noted breifly during hospitalization for PCM placement 11/10.    Past Surgical History  Procedure Laterality Date  . Coronary artery bypass graft    . Rectal abcess      2005. Dr. Carolynne Edouard with interanal sphincterotomy  . Pacemaker insertion       MDT implanted 2010    reports that he quit smoking about 6 years ago. He has never used smokeless tobacco. He reports that he drinks about 1.2 ounces of alcohol per week. He reports that he does not use illicit drugs. family history is negative for Colon cancer. No Known Allergies Current Outpatient Prescriptions on File Prior to Visit  Medication Sig Dispense Refill  . amLODipine (NORVASC) 5 MG tablet Take 1 tablet (5 mg total) by mouth daily.  90 tablet  3  . aspirin 325 MG tablet Take 325 mg by mouth daily.        [name] atorvastatin (LIPITOR) 40 MG tablet Take 1 tablet (40 mg total) by mouth daily.  90 tablet  3  . glucose blood test strip Use as instructed  100 each  12  . Lancets MISC 1 application by Does not apply route daily.  100 each  11  . lisinopril (PRINIVIL,ZESTRIL) 40 MG tablet Take 1 tablet (40 mg total) by mouth daily.  90 tablet  3  . meloxicam (MOBIC) 15 MG tablet TAKE 1 TABLET BY MOUTH EVERY DAY  30 tablet  8  . metoprolol (LOPRESSOR) 50 MG tablet Take 1 tablet (50 mg total) by mouth 2 (two) times daily.  180 tablet  3  . Omega-3 Fatty Acids (OMEGA-3 FISH OIL) 1200 MG CAPS Take by mouth 2 (two) times daily.        [name]  omeprazole (PRILOSEC) 20 MG capsule Take 1 capsule (20 mg total) by mouth 2 (two) times daily.  180 capsule  3   No current facility-administered medications on file prior to visit.   Review of Systems  Constitutional: Negative for unexpected weight change, or unusual diaphoresis  HENT: Negative for tinnitus.   Eyes: Negative for photophobia and visual disturbance.  Respiratory: Negative for choking and stridor.   Gastrointestinal: Negative for vomiting and blood in stool.  Genitourinary: Negative for hematuria and decreased urine volume.  Musculoskeletal: Negative for acute joint swelling Skin: Negative for color change and wound.  Neurological: Negative for tremors and numbness other than noted  Psychiatric/Behavioral: Negative for decreased  concentration or  hyperactivity.       Objective:   Physical Exam BP 130/80  Pulse 77  Temp(Src) 99.7 F (37.6 C) (Oral)  Ht 5\' 3"  (1.6 m)  Wt 157 lb (71.215 kg)  BMI 27.82 kg/m2  SpO2 97% VS noted,  Constitutional: Pt appears well-developed and well-nourished.  HENT: Head: NCAT.  Right Ear: External ear normal.  Left Ear: External ear normal.  Eyes: Conjunctivae and EOM are normal. Pupils are equal, round, and reactive to light.  Neck: Normal range of motion. Neck supple.  Cardiovascular: Normal rate and regular rhythm.   Pulmonary/Chest: Effort normal and breath sounds normal.  Abd:  Soft, NT, non-distended, + BS Neurological: Pt is alert. Not confused  Skin: Skin is warm. No erythema.  Psychiatric: Pt behavior is normal. Thought content normal.  Right shoulder with tender post aspect, no swelling, mild decreased ROM to internal rotation    Assessment & Plan:

## 2012-08-29 NOTE — Assessment & Plan Note (Signed)
Mild persistent, ? DJD vs other - for ortho referral

## 2012-08-29 NOTE — Assessment & Plan Note (Signed)
stable overall by history and exam, recent data reviewed with pt, and pt to continue medical treatment as before,  to f/u any worsening symptoms or concerns Lab Results  Component Value Date   HGBA1C 6.4 02/29/2012

## 2012-08-29 NOTE — Assessment & Plan Note (Signed)
stable overall by history and exam, recent data reviewed with pt, and pt to continue medical treatment as before,  to f/u any worsening symptoms or concerns BP Readings from Last 3 Encounters:  08/29/12 130/80  06/07/12 111/65  05/22/12 124/70

## 2012-08-29 NOTE — Patient Instructions (Signed)
Please continue all other medications as before, and refills have been done if requested. Please have the pharmacy call with any other refills you may need. You will be contacted regarding the referral for: orthopedic Please go to the LAB in the Basement (turn left off the elevator) for the tests to be done today You will be contacted by phone if any changes need to be made immediately.  Otherwise, you will receive a letter about your results with an explanation, but please check with MyChart first.  Please remember to sign up for My Chart if you have not done so, as this will be important to you in the future with finding out test results, communicating by private email, and scheduling acute appointments online when needed.  Please return in 6 months, or sooner if needed, with Lab testing done 3-5 days before

## 2012-08-30 LAB — LDL CHOLESTEROL, DIRECT: Direct LDL: 62.9 mg/dL

## 2012-10-18 ENCOUNTER — Other Ambulatory Visit: Payer: Self-pay | Admitting: Internal Medicine

## 2012-11-16 ENCOUNTER — Encounter: Payer: Self-pay | Admitting: Internal Medicine

## 2012-11-16 ENCOUNTER — Encounter: Payer: Self-pay | Admitting: Family Medicine

## 2012-11-16 ENCOUNTER — Ambulatory Visit (INDEPENDENT_AMBULATORY_CARE_PROVIDER_SITE_OTHER): Payer: BC Managed Care – PPO | Admitting: Family Medicine

## 2012-11-16 ENCOUNTER — Other Ambulatory Visit (INDEPENDENT_AMBULATORY_CARE_PROVIDER_SITE_OTHER): Payer: BC Managed Care – PPO

## 2012-11-16 ENCOUNTER — Ambulatory Visit (INDEPENDENT_AMBULATORY_CARE_PROVIDER_SITE_OTHER): Payer: BC Managed Care – PPO | Admitting: Internal Medicine

## 2012-11-16 ENCOUNTER — Other Ambulatory Visit: Payer: Self-pay

## 2012-11-16 VITALS — BP 122/78 | HR 65 | Ht 64.0 in | Wt 158.0 lb

## 2012-11-16 VITALS — BP 122/78 | HR 65 | Temp 98.5°F | Ht 64.0 in | Wt 158.8 lb

## 2012-11-16 DIAGNOSIS — M25511 Pain in right shoulder: Secondary | ICD-10-CM

## 2012-11-16 DIAGNOSIS — M25519 Pain in unspecified shoulder: Secondary | ICD-10-CM

## 2012-11-16 DIAGNOSIS — M25559 Pain in unspecified hip: Secondary | ICD-10-CM

## 2012-11-16 DIAGNOSIS — M75100 Unspecified rotator cuff tear or rupture of unspecified shoulder, not specified as traumatic: Secondary | ICD-10-CM

## 2012-11-16 DIAGNOSIS — M25552 Pain in left hip: Secondary | ICD-10-CM

## 2012-11-16 DIAGNOSIS — B349 Viral infection, unspecified: Secondary | ICD-10-CM | POA: Insufficient documentation

## 2012-11-16 DIAGNOSIS — B9789 Other viral agents as the cause of diseases classified elsewhere: Secondary | ICD-10-CM

## 2012-11-16 DIAGNOSIS — M7551 Bursitis of right shoulder: Secondary | ICD-10-CM

## 2012-11-16 DIAGNOSIS — M7550 Bursitis of unspecified shoulder: Secondary | ICD-10-CM | POA: Insufficient documentation

## 2012-11-16 MED ORDER — TRAMADOL HCL 50 MG PO TABS: 50.0000 mg | ORAL_TABLET | Freq: Every evening | ORAL | Status: DC | PRN

## 2012-11-16 NOTE — Assessment & Plan Note (Signed)
?   Bursitis - for sports med referral today

## 2012-11-16 NOTE — Assessment & Plan Note (Signed)
Exam most likely c/w viral illness today, for fluids, rest, tylneol prn, work note given, and immodium prn loose stools, for f/u any worsening s/s, I feel likely does not need further labs today

## 2012-11-16 NOTE — Progress Notes (Signed)
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  Regarding patient's right shoulder pain he states he's had this chronic dull aching sensation for greater than one year. Patient states it seems to be intermittent and worse with certain movements such as overhead activity. Patient denies any radiation of the arm, any numbness or any significant weakness. Patient states that the pain now has became so bad that it does wake him up at night. Patient has tried meloxicam with minimal benefit. Patient with the severity of 7/10.  Patient's left rib pain his, the insidious as well. Seems to be mostly on the lateral aspect. Worse with certain movements. Worse with starting from a sitting to standing position. States it does radiate down the lateral aspect leg towards his knee. This seems to be intermittent and can be completely resolved some days. Patient does do a lot of work on ladders and seems that the next day he can be more painful. Patient states at the meloxicam is helping. Pain is 6/10   Past medical, surgical, family and social history reviewed. Medications reviewed all in the electronic medical record.   Review of Systems: No headache, visual changes, nausea, vomiting, diarrhea, constipation, dizziness, abdominal pain, skin rash, fevers, chills, night sweats, weight loss, swollen lymph nodes, body aches, joint swelling, muscle aches, chest pain, shortness of breath, mood changes.   Objective:    Blood pressure 122/78, pulse 65, height 5\' 4"  (1.626 m), weight 158 lb (71.668 kg), SpO2 98.00%.   General: No apparent distress alert and oriented x3 mood and affect normal, dressed appropriately.  HEENT: Pupils equal, extraocular movements intact Respiratory: Patient's speak in full sentences and does not  appear short of breath Cardiovascular: No lower extremity edema, non tender, no erythema Skin: Warm dry intact with no signs of infection or rash on extremities or on axial skeleton. Abdomen: Soft nontender Neuro: Cranial nerves II through XII are intact, neurovascularly intact in all extremities with 2+ DTRs and 2+ pulses. Lymph: No lymphadenopathy of posterior or anterior cervical chain or axillae bilaterally.  Gait normal with good balance and coordination.  MSK: Non tender with full range of motion and good stability and symmetric strength and tone of  elbows, wrist, knee and ankles bilaterally.  Shoulder: Right Inspection reveals no abnormalities, atrophy or asymmetry. Palpation is normal with no tenderness over AC joint or bicipital groove. ROM is full in all planes. Rotator cuff strength normal throughout. Positive signs of impingement with negative Neer and Hawkin's tests, but negative empty can sign. Speeds and Yergason's tests normal. No labral pathology noted with negative Obrien's, negative clunk and good stability. Normal scapular function observed. No painful arc and no drop arm sign. No apprehension sign Contralateral shoulder unremarkable  Hip: Left ROM IR: 45 Deg, ER: 45 Deg, Flexion: 120 Deg, Extension: 100 Deg, Abduction: 45 Deg, Adduction: 45 Deg Strength IR: 5/5, ER: 5/5, Flexion: 5/5, Extension: 5/5, Abduction: 4/5, Adduction: 5/5 Pelvic alignment unremarkable to inspection and palpation. Standing hip rotation and gait without trendelenburg sign / unsteadiness. Greater trochanter very tender to palpation No tenderness over piriformis. pain with FABER negative FADIR. No SI joint tenderness and normal minimal SI movement. Contralateral hip unremarkable  MSK US performed of: Right shoulder This study was ordered, performed, and interpreted  by Terrilee Files D.O.  Shoulder:   Supraspinatus:  Appears normal on long and transverse views, no bursal bulge seen with  shoulder abduction on impingement view. Mild hypoechoic changes seen Infraspinatus:  Appears normal on long and transverse views. Subscapularis:  Appears normal on long and transverse views. Mild hypoechoic changes seen with likely bursitis Teres Minor:  Appears normal on long and transverse views. AC joint:  Capsule mild distention Glenohumeral Joint:  Appears normal without effusion. Glenoid Labrum:  Intact without visualized tears. Biceps Tendon:  Appears normal on long and transverse views, no fraying of tendon, tendon located in intertubercular groove, no subluxation with shoulder internal or external rotation. No increased power doppler signal.  Impression: Subacromial bursitis mild rotator cuff tendinopathy  Impression and Recommendations:     This case required medical decision making of moderate complexity.

## 2012-11-16 NOTE — Assessment & Plan Note (Signed)
Greater trochanteric bursitis with iliotibial band syndrome. Seems to be intermittent at this time the Patient will do medications per orders Home exercise program given Patient will try this and come back in 3 weeks. If he continues to have pain we will do a injection under ultrasound guidance of the greater trochanteric bursa.

## 2012-11-16 NOTE — Assessment & Plan Note (Signed)
?   Impingement syndrome not improved, for sports med referral today

## 2012-11-16 NOTE — Patient Instructions (Signed)
You are given the copy of your most recent lab work  - all stable from Aug 2014  Please continue all other medications as before, and refills have been done if requested.  Please have the pharmacy call with any other refills you may need.  You can also take Immodium OTC for what appears to be a viral illness today with diarrhea  Please call or return for any worsening symptoms such as worsening vomiting, fever, chills, worse diarrhea especially with blood, weakness, dizziness, or falls.  Please see Dr Smith/sports medicine now after our visit today

## 2012-11-16 NOTE — Assessment & Plan Note (Signed)
Discussed treatment options prognosis and rehabilitation. Home exercise program given today with theraband.  Medications per order Discussed icing protocol Patient will follow up again in 3 weeks if not having any significant improvement in pain. At that time we'll consider injection as well as formal physical therapy.

## 2012-11-16 NOTE — Progress Notes (Signed)
Subjective:    Patient ID: [name] [name], male    DOB: 1955/09/29, 57 y.o.   MRN: [id number]  HPI   Here to f/u, c/o 2 days onset low grade temp, sweats and chills, general weakness and malaise, missed work today, with occas crampy abd pain and diarrhea.  No vomiting, high fevers, rash, joint pain, orthostasis or blood.  No sick contacts known.  Overall good compliance with treatment, and good medicine tolerability. Pt denies chest pain, increased sob or doe, wheezing, orthopnea, PND, increased Vangilder swelling, palpitations, dizziness or syncope.  Pt denies new neurological symptoms such as new headache, or facial or extremity weakness or numbness  No ST, HA, cough and Denies urinary symptoms such as dysuria, frequency, urgency, flank pain, hematuria.  Also with left hip pain with radaition to the knee, worse to lie on the left side  - new in the past few wks, Also with ongoing right shoulder pain just not improved since last visit, still worse to abduct though has FROM Past Medical History  Diagnosis Date  . CAD (coronary artery disease)     The patient presented in Aug 2008 with acute cornary syndrome. He did have a bypass surgeru. He did have a 3-vessel disease on heart cathiterization and had bypass surgery with LIMA to the LAD, sequential vein graft to the obtuse marginal - 1 and obtuse marginal -2, and vein graft to the RCA. Adenosine myoview (11/10): EF 64%, normal wall motion, normal perfusion.   [name] History of echocardiogram     November 2008. EF 65-70%. no regional wall abnormalities. Trivial mitral regurgitation.  [name] GERD (gastroesophageal reflux disease)   . HTN (hypertension)   . Hyperlipidemia   . Sick sinus syndrome     with presyncope. medtronic dual chamber PCM placed 11/10  . Type II or unspecified type diabetes mellitus without mention of complication, not stated as uncontrolled   . Mild anemia     Hx of post op. 8/08  . Paroxysmal atrial fibrillation     only episode noted breifly during  hospitalization for PCM placement 11/10.    Past Surgical History  Procedure Laterality Date  . Coronary artery bypass graft    . Rectal abcess      2005. Dr. Carolynne Edouard with interanal sphincterotomy  . Pacemaker insertion      MDT implanted 2010    reports that he quit smoking about 6 years ago. He has never used smokeless tobacco. He reports that he drinks about 1.2 ounces of alcohol per week. He reports that he does not use illicit drugs. family history is negative for Colon cancer. No Known Allergies Current Outpatient Prescriptions on File Prior to Visit  Medication Sig Dispense Refill  . aspirin 325 MG tablet Take 325 mg by mouth daily.        [name] atorvastatin (LIPITOR) 40 MG tablet Take 1 tablet (40 mg total) by mouth daily.  90 tablet  3  . Lancets MISC 1 application by Does not apply route daily.  100 each  11  . lisinopril (PRINIVIL,ZESTRIL) 40 MG tablet Take 1 tablet (40 mg total) by mouth daily.  90 tablet  3  . meloxicam (MOBIC) 15 MG tablet TAKE 1 TABLET BY MOUTH EVERY DAY  30 tablet  8  . metoprolol (LOPRESSOR) 50 MG tablet Take 1 tablet (50 mg total) by mouth 2 (two) times daily.  180 tablet  3  . Omega-3 Fatty Acids (OMEGA-3 FISH OIL) 1200 MG CAPS Take by mouth  2 (two) times daily.        [name] omeprazole (PRILOSEC) 20 MG capsule TAKE 1 CAPSULE BY MOUTH TWICE DAILY  180 capsule  3  . amLODipine (NORVASC) 5 MG tablet Take 1 tablet (5 mg total) by mouth daily.  90 tablet  3   No current facility-administered medications on file prior to visit.   Review of Systems  Constitutional: Negative for unexpected weight change, HENT: Negative for tinnitus.   Eyes: Negative for photophobia and visual disturbance.  Respiratory: Negative for choking and stridor.   Gastrointestinal: Negative for vomiting and blood in stool.  Genitourinary: Negative for hematuria and decreased urine volume.  Musculoskeletal: Negative for acute joint swelling Skin: Negative for color change and wound.   Neurological: Negative for tremors and numbness other than noted  Psychiatric/Behavioral: Negative for decreased concentration or  hyperactivity.       Objective:   Physical Exam BP 122/78  Pulse 65  Temp(Src) 98.5 F (36.9 C) (Oral)  Ht 5\' 4"  (1.626 m)  Wt 158 lb 12 oz (72.009 kg)  BMI 27.24 kg/m2  SpO2 98% VS noted, mild ill Constitutional: Pt appears well-developed and well-nourished.  HENT: Head: NCAT.  Right Ear: External ear normal.  Left Ear: External ear normal.  Eyes: Conjunctivae and EOM are normal. Pupils are equal, round, and reactive to light.  Neck: Normal range of motion. Neck supple.  Cardiovascular: Normal rate and regular rhythm.   Pulmonary/Chest: Effort normal and breath sounds normal.  Abd:  Soft, NT, non-distended, + BS - benign exam Left lateral hip with some tender over greater trochanter, Right shoulder with mild tender diffusely, pain on active ROM Neurological: Pt is alert. Not confused  Skin: Skin is warm. No erythema.  Psychiatric: Pt behavior is normal. Thought content normal.     Assessment & Plan:

## 2012-11-16 NOTE — Progress Notes (Signed)
Pre visit review using our clinic review tool, if applicable. No additional management support is needed unless otherwise documented below in the visit note. 

## 2012-11-16 NOTE — Patient Instructions (Signed)
Very nice to meet you You have bursitis in your shoulder and on your hip..  Try the exercises I am giving you daily Try tramadol at night to help with pain.  Continue the meloxicam you are taking Consider capsicin cream at night.  Ice 20 minutes 2 times a day.  Come back again in 3 weeks, if still in pain I would really consider doing the injections

## 2012-12-03 ENCOUNTER — Other Ambulatory Visit: Payer: Self-pay | Admitting: Internal Medicine

## 2012-12-12 ENCOUNTER — Encounter: Payer: Self-pay | Admitting: *Deleted

## 2013-01-09 ENCOUNTER — Other Ambulatory Visit: Payer: Self-pay | Admitting: Internal Medicine

## 2013-02-07 ENCOUNTER — Ambulatory Visit (INDEPENDENT_AMBULATORY_CARE_PROVIDER_SITE_OTHER): Payer: BC Managed Care – PPO | Admitting: *Deleted

## 2013-02-07 ENCOUNTER — Encounter: Payer: Self-pay | Admitting: Internal Medicine

## 2013-02-07 DIAGNOSIS — I498 Other specified cardiac arrhythmias: Secondary | ICD-10-CM

## 2013-02-07 DIAGNOSIS — Z950 Presence of cardiac pacemaker: Secondary | ICD-10-CM

## 2013-02-07 LAB — MDC_IDC_ENUM_SESS_TYPE_INCLINIC
Battery Impedance: 205 Ohm
Battery Remaining Longevity: 125 mo
Battery Voltage: 2.79 V
Brady Statistic AP VP Percent: 0 %
Brady Statistic AP VS Percent: 68 %
Brady Statistic AS VP Percent: 0 %
Brady Statistic AS VS Percent: 32 %
Date Time Interrogation Session: 20150128162600
Lead Channel Impedance Value: 476 Ohm
Lead Channel Impedance Value: 508 Ohm
Lead Channel Pacing Threshold Amplitude: 0.5 V
Lead Channel Pacing Threshold Amplitude: 0.75 V
Lead Channel Pacing Threshold Pulse Width: 0.4 ms
Lead Channel Pacing Threshold Pulse Width: 0.4 ms
Lead Channel Sensing Intrinsic Amplitude: 11.2 mV
Lead Channel Setting Pacing Amplitude: 2 V
Lead Channel Setting Pacing Amplitude: 2.5 V
Lead Channel Setting Pacing Pulse Width: 0.4 ms
Lead Channel Setting Sensing Sensitivity: 4 mV

## 2013-02-07 NOTE — Progress Notes (Signed)
Pacemaker check in clinic. Normal device function. Thresholds, sensing, impedances consistent with previous measurements. Device programmed to maximize longevity. No mode switch or high ventricular rates noted. Device programmed at appropriate safety margins. Histogram distribution appropriate for patient activity level. Device programmed to optimize intrinsic conduction. Estimated longevity 10.331yrs.   ROV w/ Dr. Johney FrameAllred in 44mo.

## 2013-02-12 ENCOUNTER — Other Ambulatory Visit: Payer: Self-pay | Admitting: Internal Medicine

## 2013-03-01 ENCOUNTER — Ambulatory Visit: Payer: BC Managed Care – PPO | Admitting: Internal Medicine

## 2013-04-26 ENCOUNTER — Ambulatory Visit (INDEPENDENT_AMBULATORY_CARE_PROVIDER_SITE_OTHER): Payer: No Typology Code available for payment source | Admitting: Internal Medicine

## 2013-04-26 ENCOUNTER — Other Ambulatory Visit (INDEPENDENT_AMBULATORY_CARE_PROVIDER_SITE_OTHER): Payer: No Typology Code available for payment source

## 2013-04-26 ENCOUNTER — Encounter: Payer: Self-pay | Admitting: Internal Medicine

## 2013-04-26 VITALS — BP 110/78 | HR 77 | Temp 98.2°F | Ht 64.0 in | Wt 163.2 lb

## 2013-04-26 DIAGNOSIS — Z0000 Encounter for general adult medical examination without abnormal findings: Secondary | ICD-10-CM

## 2013-04-26 DIAGNOSIS — R7309 Other abnormal glucose: Secondary | ICD-10-CM

## 2013-04-26 DIAGNOSIS — G4710 Hypersomnia, unspecified: Secondary | ICD-10-CM | POA: Insufficient documentation

## 2013-04-26 DIAGNOSIS — R7302 Impaired glucose tolerance (oral): Secondary | ICD-10-CM

## 2013-04-26 LAB — URINALYSIS, ROUTINE W REFLEX MICROSCOPIC
Bilirubin Urine: NEGATIVE
Hgb urine dipstick: NEGATIVE
Ketones, ur: NEGATIVE
Leukocytes, UA: NEGATIVE
Nitrite: NEGATIVE
Specific Gravity, Urine: >=1.03 — AB (ref 1.000–1.030)
Total Protein, Urine: NEGATIVE
Urine Glucose: NEGATIVE
Urobilinogen, UA: 0.2 (ref 0.0–1.0)
pH: 6 (ref 5.0–8.0)

## 2013-04-26 LAB — CBC WITH DIFFERENTIAL/PLATELET
Basophils Absolute: 0.1 10*3/uL (ref 0.0–0.1)
Basophils Relative: 0.9 % (ref 0.0–3.0)
Eosinophils Absolute: 0.4 10*3/uL (ref 0.0–0.7)
Eosinophils Relative: 5 % (ref 0.0–5.0)
HCT: 39.5 % (ref 39.0–52.0)
Hemoglobin: 13.2 g/dL (ref 13.0–17.0)
Lymphocytes Relative: 29.2 % (ref 12.0–46.0)
Lymphs Abs: 2.3 10*3/uL (ref 0.7–4.0)
MCHC: 33.4 g/dL (ref 30.0–36.0)
MCV: 91.8 fl (ref 78.0–100.0)
Monocytes Absolute: 0.7 10*3/uL (ref 0.1–1.0)
Monocytes Relative: 8.5 % (ref 3.0–12.0)
Neutro Abs: 4.4 10*3/uL (ref 1.4–7.7)
Neutrophils Relative %: 56.4 % (ref 43.0–77.0)
Platelets: 292 10*3/uL (ref 150.0–400.0)
RBC: 4.3 Mil/uL (ref 4.22–5.81)
RDW: 12.3 % (ref 11.5–14.6)
WBC: 7.7 10*3/uL (ref 4.5–10.5)

## 2013-04-26 LAB — BASIC METABOLIC PANEL
BUN: 15 mg/dL (ref 6–23)
CO2: 31 mEq/L (ref 19–32)
Calcium: 10.3 mg/dL (ref 8.4–10.5)
Chloride: 103 mEq/L (ref 96–112)
Creatinine, Ser: 0.9 mg/dL (ref 0.4–1.5)
GFR: 87.24 mL/min (ref >60.00)
Glucose, Bld: 141 mg/dL — ABNORMAL HIGH (ref 70–99)
Potassium: 5 mEq/L (ref 3.5–5.1)
Sodium: 139 mEq/L (ref 135–145)

## 2013-04-26 LAB — LIPID PANEL
Cholesterol: 155 mg/dL (ref 0–200)
HDL: 49.9 mg/dL (ref >39.00)
LDL Cholesterol: 64 mg/dL (ref 0–99)
Total CHOL/HDL Ratio: 3
Triglycerides: 207 mg/dL — ABNORMAL HIGH (ref 0.0–149.0)
VLDL: 41.4 mg/dL — ABNORMAL HIGH (ref 0.0–40.0)

## 2013-04-26 LAB — HEPATIC FUNCTION PANEL
ALT: 30 U/L (ref 0–53)
AST: 24 U/L (ref 0–37)
Albumin: 3.9 g/dL (ref 3.5–5.2)
Alkaline Phosphatase: 41 U/L (ref 39–117)
Bilirubin, Direct: 0 mg/dL (ref 0.0–0.3)
Total Bilirubin: 0.6 mg/dL (ref 0.3–1.2)
Total Protein: 7 g/dL (ref 6.0–8.3)

## 2013-04-26 LAB — PSA: PSA: 0.35 ng/mL (ref 0.10–4.00)

## 2013-04-26 LAB — HEMOGLOBIN A1C: Hgb A1c MFr Bld: 6.5 % (ref 4.6–6.5)

## 2013-04-26 LAB — TSH: TSH: 1.65 u[IU]/mL (ref 0.35–5.50)

## 2013-04-26 NOTE — Progress Notes (Signed)
Subjective:    Patient ID: [name] [name], male    DOB: 02/16/1955, 58 y.o.   MRN: [id number]  HPI  Here for wellness and f/u;  Overall doing ok;  Pt denies CP, worsening SOB, DOE, wheezing, orthopnea, PND, worsening Reise edema, palpitations, dizziness or syncope.  Pt denies neurological change such as new headache, facial or extremity weakness.  Pt denies polydipsia, polyuria, or low sugar symptoms. Pt states overall good compliance with treatment and medications, good tolerability, and has been trying to follow lower cholesterol diet.  Pt denies worsening depressive symptoms, suicidal ideation or panic. No fever, night sweats, wt loss, loss of appetite, or other constitutional symptoms.  Pt states good ability with ADL's, has low fall risk, home safety reviewed and adequate, no other significant changes in hearing or vision, and only occasionally active with exercise.  Does have daytime somnolence most days, often takes naps, not sure if snores at night, married but wife has not remarked. Has ongoing mild right shoulder pain as well,  Back pain improved after seeing Dr Katrinka BlazingSmith Past Medical History  Diagnosis Date  . CAD (coronary artery disease)     The patient presented in Aug 2008 with acute cornary syndrome. He did have a bypass surgeru. He did have a 3-vessel disease on heart cathiterization and had bypass surgery with LIMA to the LAD, sequential vein graft to the obtuse marginal - 1 and obtuse marginal -2, and vein graft to the RCA. Adenosine myoview (11/10): EF 64%, normal wall motion, normal perfusion.   [name]. History of echocardiogram     November 2008. EF 65-70%. no regional wall abnormalities. Trivial mitral regurgitation.  [name]. GERD (gastroesophageal reflux disease)   . HTN (hypertension)   . Hyperlipidemia   . Sick sinus syndrome     with presyncope. medtronic dual chamber PCM placed 11/10  . Type II or unspecified type diabetes mellitus without mention of complication, not stated as uncontrolled   .  Mild anemia     Hx of post op. 8/08  . Paroxysmal atrial fibrillation     only episode noted breifly during hospitalization for PCM placement 11/10.    Past Surgical History  Procedure Laterality Date  . Coronary artery bypass graft    . Rectal abcess      2005. Dr. Carolynne Edouardoth with interanal sphincterotomy  . Pacemaker insertion      MDT implanted 2010    reports that he quit smoking about 7 years ago. He has never used smokeless tobacco. He reports that he drinks about 1.2 ounces of alcohol per week. He reports that he does not use illicit drugs. family history is negative for Colon cancer. No Known Allergies Current Outpatient Prescriptions on File Prior to Visit  Medication Sig Dispense Refill  . amLODipine (NORVASC) 5 MG tablet TAKE 1 TABLET BY MOUTH EVERY DAY  90 tablet  3  . aspirin 325 MG tablet Take 325 mg by mouth daily.        [name]. atorvastatin (LIPITOR) 40 MG tablet TAKE 1 TABLET BY MOUTH DAILY  90 tablet  3  . atorvastatin (LIPITOR) 40 MG tablet TAKE 1 TABLET BY MOUTH DAILY  90 tablet  0  . Lancets MISC 1 application by Does not apply route daily.  100 each  11  . lisinopril (PRINIVIL,ZESTRIL) 40 MG tablet TAKE 1 TABLET BY MOUTH DAILY  90 tablet  3  . meloxicam (MOBIC) 15 MG tablet TAKE 1 TABLET BY MOUTH EVERY DAY  30 tablet  11  . metoprolol (LOPRESSOR) 50 MG tablet TAKE 1 TABLET BY MOUTH TWICE DAILY  180 tablet  3  . Omega-3 Fatty Acids (OMEGA-3 FISH OIL) 1200 MG CAPS Take by mouth 2 (two) times daily.        [name]. omeprazole (PRILOSEC) 20 MG capsule TAKE 1 CAPSULE BY MOUTH TWICE DAILY  180 capsule  3  . traMADol (ULTRAM) 50 MG tablet Take 1 tablet (50 mg total) by mouth at bedtime as needed.  30 tablet  0   No current facility-administered medications on file prior to visit.   Review of Systems Constitutional: Negative for diaphoresis, activity change, appetite change or unexpected weight change.  HENT: Negative for hearing loss, ear pain, facial swelling, mouth sores and neck  stiffness.   Eyes: Negative for pain, redness and visual disturbance.  Respiratory: Negative for shortness of breath and wheezing.   Cardiovascular: Negative for chest pain and palpitations.  Gastrointestinal: Negative for diarrhea, blood in stool, abdominal distention or other pain Genitourinary: Negative for hematuria, flank pain or change in urine volume.  Musculoskeletal: Negative for myalgias and joint swelling.  Skin: Negative for color change and wound.  Neurological: Negative for syncope and numbness. other than noted Hematological: Negative for adenopathy.  Psychiatric/Behavioral: Negative for hallucinations, self-injury, decreased concentration and agitation.      Objective:   Physical Exam BP 110/78  Pulse 77  Temp(Src) 98.2 F (36.8 C) (Oral)  Ht 5\' 4"  (1.626 m)  Wt 163 lb 4 oz (74.05 kg)  BMI 28.01 kg/m2  SpO2 99% VS noted,  Constitutional: Pt is oriented to person, place, and time. Appears well-developed and well-nourished.  Head: Normocephalic and atraumatic.  Right Ear: External ear normal.  Left Ear: External ear normal.  Nose: Nose normal.  Mouth/Throat: Oropharynx is clear and moist.  Eyes: Conjunctivae and EOM are normal. Pupils are equal, round, and reactive to light.  Neck: Normal range of motion. Neck supple. No JVD present. No tracheal deviation present.  Cardiovascular: Normal rate, regular rhythm, normal heart sounds and intact distal pulses.   Pulmonary/Chest: Effort normal and breath sounds normal.  Abdominal: Soft. Bowel sounds are normal. There is no tenderness. No HSM  Musculoskeletal: Normal range of motion. Exhibits no edema.  Lymphadenopathy:  Has no cervical adenopathy.  Neurological: Pt is alert and oriented to person, place, and time. Pt has normal reflexes. No cranial nerve deficit.  Skin: Skin is warm and dry. No rash noted.  Psychiatric:  Has  normal mood and affect. Behavior is normal.     Assessment & Plan:

## 2013-04-26 NOTE — Assessment & Plan Note (Signed)

## 2013-04-26 NOTE — Assessment & Plan Note (Signed)
?   [facility] - for pulm referral 

## 2013-04-26 NOTE — Progress Notes (Signed)
Pre visit review using our clinic review tool, if applicable. No additional management support is needed unless otherwise documented below in the visit note. 

## 2013-04-26 NOTE — Patient Instructions (Signed)
Please continue all other medications as before, and refills have been done if requested. Please have the pharmacy call with any other refills you may need.  Please continue your efforts at being more active, low cholesterol diet, and weight control. You are otherwise up to date with prevention measures today.  You will be contacted regarding the referral for: pulmonary for possible sleep apnea  Please go to the LAB in the Basement (turn left off the elevator) for the tests to be done today You will be contacted by phone if any changes need to be made immediately.  Otherwise, you will receive a letter about your results with an explanation, but please check with MyChart first.  Please remember to sign up for MyChart if you have not done so, as this will be important to you in the future with finding out test results, communicating by private email, and scheduling acute appointments online when needed.  Please return in 6 months, or sooner if needed

## 2013-05-16 ENCOUNTER — Other Ambulatory Visit: Payer: Self-pay | Admitting: Internal Medicine

## 2013-05-16 ENCOUNTER — Institutional Professional Consult (permissible substitution): Payer: No Typology Code available for payment source | Admitting: Pulmonary Disease

## 2013-06-20 ENCOUNTER — Ambulatory Visit (INDEPENDENT_AMBULATORY_CARE_PROVIDER_SITE_OTHER): Payer: No Typology Code available for payment source | Admitting: Pulmonary Disease

## 2013-06-20 ENCOUNTER — Encounter: Payer: Self-pay | Admitting: Pulmonary Disease

## 2013-06-20 VITALS — BP 110/62 | HR 63 | Temp 97.5°F | Ht 63.0 in | Wt 160.2 lb

## 2013-06-20 DIAGNOSIS — G4710 Hypersomnia, unspecified: Secondary | ICD-10-CM

## 2013-06-20 NOTE — Assessment & Plan Note (Signed)
He has snoring, sleep disruption, and daytime sleepiness.  He has hx of CAD.  I am concerned he could have sleep apnea.  He is not convinced he has problems with his breathing while asleep, and does not think he needs additional sleep testing.  I have advised him to discuss this further with his family, and to also discuss this with Dr. Jonny Ruiz and Dr. Shirlee Latch.  I emphasized how untreated sleep apnea can affect his heart function and blood pressure control.  Advised him to call back to schedule sleep study if he is interested in pursuing sleep apnea evaluation further.

## 2013-06-20 NOTE — Patient Instructions (Signed)
Call our office if you are interested in arranging for further sleep testing

## 2013-06-20 NOTE — Progress Notes (Deleted)
   Subjective:    Patient ID: [name] [name], male    DOB: Aug 20, 1955, 58 y.o.   MRN: [id number]  HPI    Review of Systems  Constitutional: Negative for fever and unexpected weight change.  HENT: Negative for congestion, dental problem, ear pain, nosebleeds, postnasal drip, rhinorrhea, sinus pressure, sneezing, sore throat and trouble swallowing.   Eyes: Negative for redness and itching.  Respiratory: Negative for cough, chest tightness, shortness of breath and wheezing.   Cardiovascular: Negative for palpitations and leg swelling.  Gastrointestinal: Negative for nausea and vomiting.  Genitourinary: Negative for dysuria.  Musculoskeletal: Negative for joint swelling.  Skin: Negative for rash.  Neurological: Negative for headaches.  Hematological: Does not bruise/bleed easily.  Psychiatric/Behavioral: Negative for dysphoric mood. The patient is not nervous/anxious.        Objective:   Physical Exam        Assessment & Plan:

## 2013-06-20 NOTE — Progress Notes (Signed)
Chief Complaint  Patient presents with  . SLEEP CONSULT    Referred by Dr Jonny Ruiz. Epworth Score: 10    History of Present Illness: [name] [name] is a 58 y.o. male for evaluation of sleep problems.  He was recently seen by his PCP, and there was concern about his snoring and being sleepy during the day.  He will typically fall asleep in the afternoon on the couch while watching TV.  He gets a dry mouth at night, and has trouble sleeping on his back.  He will sometimes wake up in the middle of dreams.  He goes to sleep at 10 pm.  He falls asleep after 20.  He wakes up occasionally to use the bathroom.  He gets out of bed at 5 am.  He feels okay in the morning.  He denies morning headache.  He does not use anything to help him fall sleep.  He drinks one cup of coffee in the morning.  He denies sleep walking, sleep talking, bruxism, or nightmares.  There is no history of restless legs.  He denies sleep hallucinations, sleep paralysis, or cataplexy.  The Epworth score is 10 out of 24.   [name] [name]  has a past medical history of CAD (coronary artery disease); History of echocardiogram; GERD (gastroesophageal reflux disease); HTN (hypertension); Hyperlipidemia; Sick sinus syndrome; Type II or unspecified type diabetes mellitus without mention of complication, not stated as uncontrolled; Mild anemia; and Paroxysmal atrial fibrillation.  [name] [name]  has past surgical history that includes Coronary artery bypass graft; rectal abcess; and Pacemaker insertion.  Prior to Admission medications   Medication Sig Start Date End Date Taking? Authorizing Provider  amLODipine (NORVASC) 5 MG tablet TAKE 1 TABLET BY MOUTH EVERY DAY 01/09/13  Yes Corwin Levins, MD  aspirin 325 MG tablet Take 325 mg by mouth daily.     Yes Historical Provider, MD  atorvastatin (LIPITOR) 40 MG tablet TAKE 1 TABLET BY MOUTH DAILY 01/09/13  Yes Corwin Levins, MD  Lancets MISC 1 application by Does not apply route daily. 09/03/11 09/02/13 Yes  Corwin Levins, MD  lisinopril (PRINIVIL,ZESTRIL) 40 MG tablet TAKE 1 TABLET BY MOUTH DAILY 01/09/13  Yes Corwin Levins, MD  meloxicam (MOBIC) 15 MG tablet TAKE 1 TABLET BY MOUTH EVERY DAY 01/09/13  Yes Corwin Levins, MD  metoprolol (LOPRESSOR) 50 MG tablet TAKE 1 TABLET BY MOUTH TWICE DAILY 01/09/13  Yes Corwin Levins, MD  Omega-3 Fatty Acids (OMEGA-3 FISH OIL) 1200 MG CAPS Take by mouth 2 (two) times daily.     Yes Historical Provider, MD  omeprazole (PRILOSEC) 20 MG capsule TAKE 1 CAPSULE BY MOUTH TWICE DAILY 10/18/12  Yes Corwin Levins, MD  traMADol (ULTRAM) 50 MG tablet Take 1 tablet (50 mg total) by mouth at bedtime as needed. 11/16/12  Yes Judi Saa, DO    No Known Allergies  His family history is negative for Colon cancer.  He  reports that he quit smoking about 7 years ago. His smoking use included Cigarettes. He has a 20 pack-year smoking history. He has never used smokeless tobacco. He reports that he drinks about 1.2 ounces of alcohol per week. He reports that he does not use illicit drugs.  Review of Systems  Constitutional: Negative for fever and unexpected weight change.  HENT: Negative for congestion, dental problem, ear pain, nosebleeds, postnasal drip, rhinorrhea, sinus pressure, sneezing, sore throat and trouble swallowing.   Eyes: Negative for  redness and itching.  Respiratory: Negative for cough, chest tightness, shortness of breath and wheezing.   Cardiovascular: Negative for palpitations and leg swelling.  Gastrointestinal: Negative for nausea and vomiting.  Genitourinary: Negative for dysuria.  Musculoskeletal: Negative for joint swelling.  Skin: Negative for rash.  Neurological: Negative for headaches.  Hematological: Does not bruise/bleed easily.  Psychiatric/Behavioral: Negative for dysphoric mood. The patient is not nervous/anxious.    Physical Exam:  General - No distress ENT - No sinus tenderness, no oral exudate, no LAN, no thyromegaly, TM clear, pupils  equal/reactive, MP 3 Cardiac - s1s2 regular, no murmur, pulses symmetric Chest - No wheeze/rales/dullness, good air entry, normal respiratory excursion Back - No focal tenderness Abd - Soft, non-tender, no organomegaly, + bowel sounds Ext - No edema Neuro - Normal strength, cranial nerves intact Skin - No rashes Psych - Normal mood, and behavior  Assessment/plan:  Coralyn HellingVineet Lotta Frankenfield, M.D. Pager [phone]

## 2013-08-24 ENCOUNTER — Encounter: Payer: Self-pay | Admitting: *Deleted

## 2013-10-15 ENCOUNTER — Encounter: Payer: Self-pay | Admitting: Internal Medicine

## 2013-10-15 ENCOUNTER — Ambulatory Visit (INDEPENDENT_AMBULATORY_CARE_PROVIDER_SITE_OTHER): Payer: No Typology Code available for payment source | Admitting: Internal Medicine

## 2013-10-15 VITALS — BP 140/62 | HR 75 | Ht 63.0 in | Wt 163.1 lb

## 2013-10-15 DIAGNOSIS — I119 Hypertensive heart disease without heart failure: Secondary | ICD-10-CM

## 2013-10-15 DIAGNOSIS — I495 Sick sinus syndrome: Secondary | ICD-10-CM

## 2013-10-15 DIAGNOSIS — Z950 Presence of cardiac pacemaker: Secondary | ICD-10-CM

## 2013-10-15 DIAGNOSIS — I2510 Atherosclerotic heart disease of native coronary artery without angina pectoris: Secondary | ICD-10-CM

## 2013-10-15 DIAGNOSIS — I480 Paroxysmal atrial fibrillation: Secondary | ICD-10-CM

## 2013-10-15 LAB — MDC_IDC_ENUM_SESS_TYPE_INCLINIC
Battery Impedance: 228 Ohm
Battery Remaining Longevity: 119 mo
Battery Voltage: 2.79 V
Brady Statistic AP VP Percent: 0 %
Brady Statistic AP VS Percent: 74 %
Brady Statistic AS VP Percent: 0 %
Brady Statistic AS VS Percent: 26 %
Date Time Interrogation Session: 20151005161102
Lead Channel Impedance Value: 508 Ohm
Lead Channel Impedance Value: 527 Ohm
Lead Channel Pacing Threshold Amplitude: 0.5 V
Lead Channel Pacing Threshold Amplitude: 0.75 V
Lead Channel Pacing Threshold Pulse Width: 0.4 ms
Lead Channel Pacing Threshold Pulse Width: 0.4 ms
Lead Channel Sensing Intrinsic Amplitude: 1.4 mV
Lead Channel Sensing Intrinsic Amplitude: 15.67 mV
Lead Channel Setting Pacing Amplitude: 2 V
Lead Channel Setting Pacing Amplitude: 2.5 V
Lead Channel Setting Pacing Pulse Width: 0.4 ms
Lead Channel Setting Sensing Sensitivity: 5.6 mV

## 2013-10-15 MED ORDER — ASPIRIN 81 MG PO TABS: 81.0000 mg | ORAL_TABLET | Freq: Every day | ORAL | Status: AC

## 2013-10-15 NOTE — Patient Instructions (Addendum)
Your physician wants you to follow-up in: 6 months in the device clinic on sameday as appointment with Dr Shirlee LatchMcLean and 12 months with Dr Jacquiline DoeAllred You will receive a reminder letter in the mail two months in advance. If you don't receive a letter, please call our office to schedule the follow-up appointment.   Your physician has recommended you make the following change in your medication: 1) Decrease Aspirin to 81mg  daily

## 2013-10-15 NOTE — Progress Notes (Signed)
PCP:  Oliver BarreJames John, MD  The patient presents today for routine electrophysiology followup.  Since last being seen in our clinic, the patient reports doing very well.  Today, he denies symptoms of palpitations, chest pain, shortness of breath, orthopnea, PND, lower extremity edema, dizziness, presyncope, syncope, or neurologic sequela.  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  He has some difficulty lifting heavy signs due to arthritis.  The patient feels that he is tolerating medications without difficulties and is otherwise without complaint today.   Past Medical History  Diagnosis Date  . CAD (coronary artery disease)     The patient presented in Aug 2008 with acute cornary syndrome. He did have a bypass surgeru. He did have a 3-vessel disease on heart cathiterization and had bypass surgery with LIMA to the LAD, sequential vein graft to the obtuse marginal - 1 and obtuse marginal -2, and vein graft to the RCA. Adenosine myoview (11/10): EF 64%, normal wall motion, normal perfusion.   [name]. History of echocardiogram     November 2008. EF 65-70%. no regional wall abnormalities. Trivial mitral regurgitation.  [name]. GERD (gastroesophageal reflux disease)   . HTN (hypertension)   . Hyperlipidemia   . Sick sinus syndrome     with presyncope. medtronic dual chamber PCM placed 11/10  . Type II or unspecified type diabetes mellitus without mention of complication, not stated as uncontrolled   . Mild anemia     Hx of post op. 8/08  . Paroxysmal atrial fibrillation     only episode noted breifly during hospitalization for PCM placement 11/10.    Past Surgical History  Procedure Laterality Date  . Coronary artery bypass graft    . Rectal abcess      2005. Dr. Carolynne Edouardoth with interanal sphincterotomy  . Pacemaker insertion      MDT implanted 2010    Current Outpatient Prescriptions  Medication Sig Dispense Refill  . amLODipine (NORVASC) 5 MG tablet TAKE 1 TABLET BY MOUTH EVERY DAY  90 tablet  3  .  aspirin 325 MG tablet Take 325 mg by mouth daily.        [name]. lisinopril (PRINIVIL,ZESTRIL) 40 MG tablet TAKE 1 TABLET BY MOUTH DAILY  90 tablet  3  . meloxicam (MOBIC) 15 MG tablet TAKE 1 TABLET BY MOUTH EVERY DAY  30 tablet  11  . metoprolol (LOPRESSOR) 50 MG tablet TAKE 1 TABLET BY MOUTH TWICE DAILY  180 tablet  3  . Omega-3 Fatty Acids (OMEGA-3 FISH OIL) 1200 MG CAPS Take 1 capsule by mouth 2 (two) times daily.       [name]. omeprazole (PRILOSEC) 20 MG capsule TAKE 1 CAPSULE BY MOUTH TWICE DAILY  180 capsule  3  . traMADol (ULTRAM) 50 MG tablet Take 1 tablet (50 mg total) by mouth at bedtime as needed.  30 tablet  0  . atorvastatin (LIPITOR) 40 MG tablet TAKE 1 TABLET BY MOUTH DAILY  90 tablet  3   No current facility-administered medications for this visit.    No Known Allergies  History   Social History  . Marital Status: Married    Spouse Name: N/A    Number of Children: N/A  . Years of Education: N/A   Occupational History  . Not on file.   Social History Main Topics  . Smoking status: Former Smoker -- 1.00 packs/day for 20 years    Types: Cigarettes    Quit date: 01/11/2006  . Smokeless tobacco: Never Used  .  Alcohol Use: 1.2 oz/week    2 Cans of beer per week     Comment: social on weekends  . Drug Use: No  . Sexual Activity: Not on file   Other Topics Concern  . Not on file   Social History Narrative   Married, 3 children. To [place] from [place] 1979. Work- sign spray pain- graphic [employer]   ROS- all systems are reviewed and negative except as per HPI above  Physical Exam: Filed Vitals:   10/15/13 1553  BP: 140/62  Pulse: 75  Height: 5\' 3"  (1.6 m)  Weight: 163 lb 1.9 oz (73.991 kg)    GEN- The patient is well appearing, alert and oriented x 3 today.   Head- normocephalic, atraumatic Eyes-  Sclera clear, conjunctiva pink Ears- hearing intact Oropharynx- clear Neck- supple, no JVP Lymph- no cervical lymphadenopathy Lungs- Clear to ausculation bilaterally,  normal work of breathing Chest- pacemaker pocket is well healed Heart- Regular rate and rhythm, no murmurs, rubs or gallops, PMI not laterally displaced GI- soft, NT, ND, + BS Extremities- no clubbing, cyanosis, or edema Neuro- strength and sensation are intact  Pacemaker interrogation- reviewed in detail today,  See PACEART report  Assessment and Plan:  1. Sick sinus syndrome Normal pacemaker function See Pace Art report No changes today  2. CAD No ischemic symptoms Reduce ASA to 81mg  daily today  3. Hypertensive cardiovascular disease Stable No change required today Avoid NSAIDS as arthritis allows  Return to the device clinic in 6 months and to see primary cardiologist (Dr Shirlee Latch) at that time I will see again in 1 year

## 2013-10-18 ENCOUNTER — Other Ambulatory Visit: Payer: Self-pay | Admitting: Internal Medicine

## 2013-10-30 ENCOUNTER — Ambulatory Visit (INDEPENDENT_AMBULATORY_CARE_PROVIDER_SITE_OTHER): Payer: No Typology Code available for payment source | Admitting: Internal Medicine

## 2013-10-30 ENCOUNTER — Other Ambulatory Visit (INDEPENDENT_AMBULATORY_CARE_PROVIDER_SITE_OTHER): Payer: No Typology Code available for payment source

## 2013-10-30 ENCOUNTER — Encounter: Payer: Self-pay | Admitting: Internal Medicine

## 2013-10-30 VITALS — BP 122/72 | HR 75 | Temp 98.3°F | Ht 63.0 in | Wt 161.4 lb

## 2013-10-30 DIAGNOSIS — I119 Hypertensive heart disease without heart failure: Secondary | ICD-10-CM

## 2013-10-30 DIAGNOSIS — E785 Hyperlipidemia, unspecified: Secondary | ICD-10-CM

## 2013-10-30 DIAGNOSIS — E119 Type 2 diabetes mellitus without complications: Secondary | ICD-10-CM

## 2013-10-30 DIAGNOSIS — Z0000 Encounter for general adult medical examination without abnormal findings: Secondary | ICD-10-CM

## 2013-10-30 DIAGNOSIS — Z0189 Encounter for other specified special examinations: Secondary | ICD-10-CM

## 2013-10-30 LAB — HEMOGLOBIN A1C: Hgb A1c MFr Bld: 6.8 % — ABNORMAL HIGH (ref 4.6–6.5)

## 2013-10-30 LAB — LIPID PANEL
Cholesterol: 159 mg/dL (ref 0–200)
HDL: 54.6 mg/dL (ref >39.00)
NonHDL: 104.4
Total CHOL/HDL Ratio: 3
Triglycerides: 264 mg/dL — ABNORMAL HIGH (ref 0.0–149.0)
VLDL: 52.8 mg/dL — ABNORMAL HIGH (ref 0.0–40.0)

## 2013-10-30 LAB — BASIC METABOLIC PANEL
BUN: 20 mg/dL (ref 6–23)
CO2: 20 mEq/L (ref 19–32)
Calcium: 10 mg/dL (ref 8.4–10.5)
Chloride: 104 mEq/L (ref 96–112)
Creatinine, Ser: 1.3 mg/dL (ref 0.4–1.5)
GFR: 62.68 mL/min (ref >60.00)
Glucose, Bld: 128 mg/dL — ABNORMAL HIGH (ref 70–99)
Potassium: 4.6 mEq/L (ref 3.5–5.1)
Sodium: 140 mEq/L (ref 135–145)

## 2013-10-30 LAB — HEPATIC FUNCTION PANEL
ALT: 44 U/L (ref 0–53)
AST: 30 U/L (ref 0–37)
Albumin: 3.7 g/dL (ref 3.5–5.2)
Alkaline Phosphatase: 48 U/L (ref 39–117)
Bilirubin, Direct: 0 mg/dL (ref 0.0–0.3)
Total Bilirubin: 0.7 mg/dL (ref 0.2–1.2)
Total Protein: 8 g/dL (ref 6.0–8.3)

## 2013-10-30 MED ORDER — METOPROLOL TARTRATE 50 MG PO TABS: 50.0000 mg | ORAL_TABLET | Freq: Two times a day (BID) | ORAL | Status: DC

## 2013-10-30 MED ORDER — ATORVASTATIN CALCIUM 40 MG PO TABS: 40.0000 mg | ORAL_TABLET | Freq: Every day | ORAL | Status: DC

## 2013-10-30 MED ORDER — LISINOPRIL 40 MG PO TABS: 40.0000 mg | ORAL_TABLET | Freq: Every day | ORAL | Status: DC

## 2013-10-30 MED ORDER — AMLODIPINE BESYLATE 5 MG PO TABS: 5.0000 mg | ORAL_TABLET | Freq: Every day | ORAL | Status: DC

## 2013-10-30 NOTE — Progress Notes (Signed)
Pre visit review using our clinic review tool, if applicable. No additional management support is needed unless otherwise documented below in the visit note. 

## 2013-10-30 NOTE — Progress Notes (Signed)
Subjective:    Patient ID: [name] [name], male    DOB: 08/04/1955, 58 y.o.   MRN: [id number]  HPI Here to f/u; overall doing ok,  Pt denies chest pain, increased sob or doe, wheezing, orthopnea, PND, increased [place] swelling, palpitations, dizziness or syncope.  Pt denies polydipsia, polyuria, or low sugar symptoms such as weakness or confusion improved with po intake.  Pt denies new neurological symptoms such as new headache, or facial or extremity weakness or numbness.   Pt states overall good compliance with meds, has been trying to follow lower cholesterol, diabetic diet, with wt overall stable,  but little exercise however. No current complains Wt Readings from Last 3 Encounters:  10/30/13 161 lb 6 oz (73.199 kg)  10/15/13 163 lb 1.9 oz (73.991 kg)  06/20/13 160 lb 3.2 oz (72.666 kg)   Past Medical History  Diagnosis Date  . CAD (coronary artery disease)     The patient presented in Aug 2008 with acute cornary syndrome. He did have a bypass surgeru. He did have a 3-vessel disease on heart cathiterization and had bypass surgery with LIMA to the LAD, sequential vein graft to the obtuse marginal - 1 and obtuse marginal -2, and vein graft to the RCA. Adenosine myoview (11/10): EF 64%, normal wall motion, normal perfusion.   [name]. History of echocardiogram     November 2008. EF 65-70%. no regional wall abnormalities. Trivial mitral regurgitation.  [name]. GERD (gastroesophageal reflux disease)   . HTN (hypertension)   . Hyperlipidemia   . Sick sinus syndrome     with presyncope. medtronic dual chamber PCM placed 11/10  . Type II or unspecified type diabetes mellitus without mention of complication, not stated as uncontrolled   . Mild anemia     Hx of post op. 8/08  . Paroxysmal atrial fibrillation     only episode noted breifly during hospitalization for PCM placement 11/10.    Past Surgical History  Procedure Laterality Date  . Coronary artery bypass graft    . Rectal abcess      2005. Dr. Carolynne Edouardoth with  interanal sphincterotomy  . Pacemaker insertion      MDT implanted 2010    reports that he quit smoking about 7 years ago. His smoking use included Cigarettes. He has a 20 pack-year smoking history. He has never used smokeless tobacco. He reports that he drinks about 1.2 ounces of alcohol per week. He reports that he does not use illicit drugs. family history is negative for Colon cancer. No Known Allergies Current Outpatient Prescriptions on File Prior to Visit  Medication Sig Dispense Refill  . aspirin 81 MG tablet Take 1 tablet (81 mg total) by mouth daily.      . meloxicam (MOBIC) 15 MG tablet TAKE 1 TABLET BY MOUTH EVERY DAY  30 tablet  11  . Omega-3 Fatty Acids (OMEGA-3 FISH OIL) 1200 MG CAPS Take 1 capsule by mouth 2 (two) times daily.       [name]. omeprazole (PRILOSEC) 20 MG capsule TAKE 1 CAPSULE BY MOUTH TWICE DAILY  180 capsule  2  . traMADol (ULTRAM) 50 MG tablet Take 1 tablet (50 mg total) by mouth at bedtime as needed.  30 tablet  0   No current facility-administered medications on file prior to visit.   Review of Systems  Constitutional: Negative for unusual diaphoresis or other sweats  HENT: Negative for ringing in ear Eyes: Negative for double vision or worsening visual disturbance.  Respiratory: Negative for  choking and stridor.   Gastrointestinal: Negative for vomiting or other signifcant bowel change Genitourinary: Negative for hematuria or decreased urine volume.  Musculoskeletal: Negative for other MSK pain or swelling Skin: Negative for color change and worsening wound.  Neurological: Negative for tremors and numbness other than noted  Psychiatric/Behavioral: Negative for decreased concentration or agitation other than above       Objective:   Physical Exam BP 122/72  Pulse 75  Temp(Src) 98.3 F (36.8 C) (Oral)  Ht 5\' 3"  (1.6 m)  Wt 161 lb 6 oz (73.199 kg)  BMI 28.59 kg/m2  SpO2 97% VS noted,  Constitutional: Pt appears well-developed, well-nourished.    HENT: Head: NCAT.  Right Ear: External ear normal.  Left Ear: External ear normal.  Eyes: . Pupils are equal, round, and reactive to light. Conjunctivae and EOM are normal Neck: Normal range of motion. Neck supple.  Cardiovascular: Normal rate and regular rhythm.   Pulmonary/Chest: Effort normal and breath sounds normal.  Abd:  Soft, NT, ND, + BS Neurological: Pt is alert. Not confused , motor grossly intact Skin: Skin is warm. No rash Psychiatric: Pt behavior is normal. No agitation.     Assessment & Plan:

## 2013-10-30 NOTE — Patient Instructions (Signed)

## 2013-10-31 LAB — LDL CHOLESTEROL, DIRECT: Direct LDL: 54.2 mg/dL

## 2013-11-04 NOTE — Assessment & Plan Note (Signed)
stable overall by history and exam, recent data reviewed with pt, and pt to continue medical treatment as before,  to f/u any worsening symptoms or concerns Lab Results  Component Value Date   LDLCALC 64 04/26/2013

## 2013-11-04 NOTE — Assessment & Plan Note (Signed)
stable overall by history and exam, recent data reviewed with pt, and pt to continue medical treatment as before,  to f/u any worsening symptoms or concerns BP Readings from Last 3 Encounters:  10/30/13 122/72  10/15/13 140/62  06/20/13 110/62

## 2013-11-04 NOTE — Assessment & Plan Note (Signed)
stable overall by history and exam, recent data reviewed with pt, and pt to continue medical treatment as before,  to f/u any worsening symptoms or concerns; Lab Results  Component Value Date   HGBA1C 6.8* 10/30/2013

## 2014-01-19 ENCOUNTER — Other Ambulatory Visit: Payer: Self-pay | Admitting: Internal Medicine

## 2014-05-01 ENCOUNTER — Ambulatory Visit (INDEPENDENT_AMBULATORY_CARE_PROVIDER_SITE_OTHER): Payer: PRIVATE HEALTH INSURANCE | Admitting: Internal Medicine

## 2014-05-01 ENCOUNTER — Other Ambulatory Visit (INDEPENDENT_AMBULATORY_CARE_PROVIDER_SITE_OTHER): Payer: PRIVATE HEALTH INSURANCE

## 2014-05-01 ENCOUNTER — Encounter: Payer: Self-pay | Admitting: Internal Medicine

## 2014-05-01 VITALS — BP 126/80 | HR 65 | Temp 98.9°F | Resp 18 | Ht 63.0 in | Wt 160.1 lb

## 2014-05-01 DIAGNOSIS — Z0000 Encounter for general adult medical examination without abnormal findings: Secondary | ICD-10-CM | POA: Diagnosis not present

## 2014-05-01 DIAGNOSIS — Z23 Encounter for immunization: Secondary | ICD-10-CM | POA: Diagnosis not present

## 2014-05-01 DIAGNOSIS — Z0189 Encounter for other specified special examinations: Secondary | ICD-10-CM

## 2014-05-01 DIAGNOSIS — E119 Type 2 diabetes mellitus without complications: Secondary | ICD-10-CM | POA: Diagnosis not present

## 2014-05-01 DIAGNOSIS — R7989 Other specified abnormal findings of blood chemistry: Secondary | ICD-10-CM | POA: Diagnosis not present

## 2014-05-01 LAB — URINALYSIS, ROUTINE W REFLEX MICROSCOPIC
Bilirubin Urine: NEGATIVE
Hgb urine dipstick: NEGATIVE
Ketones, ur: NEGATIVE
Leukocytes, UA: NEGATIVE
Nitrite: NEGATIVE
Specific Gravity, Urine: 1.025 (ref 1.000–1.030)
Total Protein, Urine: NEGATIVE
Urine Glucose: 100 — AB
Urobilinogen, UA: 0.2 (ref 0.0–1.0)
pH: 6.5 (ref 5.0–8.0)

## 2014-05-01 LAB — CBC WITH DIFFERENTIAL/PLATELET
Basophils Absolute: 0 10*3/uL (ref 0.0–0.1)
Basophils Relative: 0.7 % (ref 0.0–3.0)
Eosinophils Absolute: 0.3 10*3/uL (ref 0.0–0.7)
Eosinophils Relative: 3.6 % (ref 0.0–5.0)
HCT: 39.5 % (ref 39.0–52.0)
Hemoglobin: 13.6 g/dL (ref 13.0–17.0)
Lymphocytes Relative: 35.8 % (ref 12.0–46.0)
Lymphs Abs: 2.6 10*3/uL (ref 0.7–4.0)
MCHC: 34.6 g/dL (ref 30.0–36.0)
MCV: 88.9 fl (ref 78.0–100.0)
Monocytes Absolute: 0.7 10*3/uL (ref 0.1–1.0)
Monocytes Relative: 9.7 % (ref 3.0–12.0)
Neutro Abs: 3.7 10*3/uL (ref 1.4–7.7)
Neutrophils Relative %: 50.2 % (ref 43.0–77.0)
Platelets: 246 10*3/uL (ref 150.0–400.0)
RBC: 4.44 Mil/uL (ref 4.22–5.81)
RDW: 12.3 % (ref 11.5–15.5)
WBC: 7.3 10*3/uL (ref 4.0–10.5)

## 2014-05-01 LAB — HEMOGLOBIN A1C: Hgb A1c MFr Bld: 7.4 % — ABNORMAL HIGH (ref 4.6–6.5)

## 2014-05-01 LAB — MICROALBUMIN / CREATININE URINE RATIO
Creatinine,U: 142.1 mg/dL
Microalb Creat Ratio: 0.6 mg/g (ref 0.0–30.0)
Microalb, Ur: 0.9 mg/dL (ref 0.0–1.9)

## 2014-05-01 NOTE — Assessment & Plan Note (Signed)

## 2014-05-01 NOTE — Progress Notes (Signed)
Subjective:    Patient ID: [name] [name], male    DOB: 09/20/1955, 59 y.o.   MRN: [id number]  HPI  Here for wellness and f/u;  Overall doing ok;  Pt denies Chest pain, worsening SOB, DOE, wheezing, orthopnea, PND, worsening [place] edema, palpitations, dizziness or syncope.  Pt denies neurological change such as new headache, facial or extremity weakness.  Pt denies polydipsia, polyuria, or low sugar symptoms. Pt states overall good compliance with treatment and medications, good tolerability, and has been trying to follow appropriate diet.  Pt denies worsening depressive symptoms, suicidal ideation or panic. No fever, night sweats, wt loss, loss of appetite, or other constitutional symptoms.  Pt states good ability with ADL's, has low fall risk, home safety reviewed and adequate, no other significant changes in hearing or vision, and only occasionally active with exercise. No current complaints Past Medical History  Diagnosis Date  . CAD (coronary artery disease)     The patient presented in Aug 2008 with acute cornary syndrome. He did have a bypass surgeru. He did have a 3-vessel disease on heart cathiterization and had bypass surgery with LIMA to the LAD, sequential vein graft to the obtuse marginal - 1 and obtuse marginal -2, and vein graft to the RCA. Adenosine myoview (11/10): EF 64%, normal wall motion, normal perfusion.   [name]. History of echocardiogram     November 2008. EF 65-70%. no regional wall abnormalities. Trivial mitral regurgitation.  [name]. GERD (gastroesophageal reflux disease)   . HTN (hypertension)   . Hyperlipidemia   . Sick sinus syndrome     with presyncope. medtronic dual chamber PCM placed 11/10  . Type II or unspecified type diabetes mellitus without mention of complication, not stated as uncontrolled   . Mild anemia     Hx of post op. 8/08  . Paroxysmal atrial fibrillation     only episode noted breifly during hospitalization for PCM placement 11/10.    Past Surgical History    Procedure Laterality Date  . Coronary artery bypass graft    . Rectal abcess      2005. Dr. Carolynne Edouardoth with interanal sphincterotomy  . Pacemaker insertion      MDT implanted 2010    reports that he quit smoking about 8 years ago. His smoking use included Cigarettes. He has a 20 pack-year smoking history. He has never used smokeless tobacco. He reports that he drinks about 1.2 oz of alcohol per week. He reports that he does not use illicit drugs. family history is negative for Colon cancer. No Known Allergies Current Outpatient Prescriptions on File Prior to Visit  Medication Sig Dispense Refill  . amLODipine (NORVASC) 5 MG tablet Take 1 tablet (5 mg total) by mouth daily. 90 tablet 3  . amLODipine (NORVASC) 5 MG tablet TAKE 1 TABLET BY MOUTH EVERY DAY 90 tablet 0  . aspirin 81 MG tablet Take 1 tablet (81 mg total) by mouth daily.    [name]. atorvastatin (LIPITOR) 40 MG tablet Take 1 tablet (40 mg total) by mouth daily at 6 PM. 90 tablet 3  . lisinopril (PRINIVIL,ZESTRIL) 40 MG tablet Take 1 tablet (40 mg total) by mouth daily. 90 tablet 3  . lisinopril (PRINIVIL,ZESTRIL) 40 MG tablet TAKE 1 TABLET BY MOUTH EVERY DAY 90 tablet 0  . meloxicam (MOBIC) 15 MG tablet TAKE 1 TABLET BY MOUTH EVERY DAY 30 tablet 3  . metoprolol (LOPRESSOR) 50 MG tablet Take 1 tablet (50 mg total) by mouth 2 (two) times daily.  180 tablet 3  . metoprolol (LOPRESSOR) 50 MG tablet TAKE 1 TABLET BY MOUTH TWICE DAILY 180 tablet 0  . Omega-3 Fatty Acids (OMEGA-3 FISH OIL) 1200 MG CAPS Take 1 capsule by mouth 2 (two) times daily.     [name] omeprazole (PRILOSEC) 20 MG capsule TAKE 1 CAPSULE BY MOUTH TWICE DAILY 180 capsule 2  . traMADol (ULTRAM) 50 MG tablet Take 1 tablet (50 mg total) by mouth at bedtime as needed. 30 tablet 0   No current facility-administered medications on file prior to visit.   Review of Systems Constitutional: Negative for increased diaphoresis, other activity, appetite or siginficant weight change other than  noted HENT: Negative for worsening hearing loss, ear pain, facial swelling, mouth sores and neck stiffness.   Eyes: Negative for other worsening pain, redness or visual disturbance.  Respiratory: Negative for shortness of breath and wheezing  Cardiovascular: Negative for chest pain and palpitations.  Gastrointestinal: Negative for diarrhea, blood in stool, abdominal distention or other pain Genitourinary: Negative for hematuria, flank pain or change in urine volume.  Musculoskeletal: Negative for myalgias or other joint complaints.  Skin: Negative for color change and wound or drainage.  Neurological: Negative for syncope and numbness. other than noted Hematological: Negative for adenopathy. or other swelling Psychiatric/Behavioral: Negative for hallucinations, SI, self-injury, decreased concentration or other worsening agitation.      Objective:   Physical Exam BP 126/80 mmHg  Pulse 65  Temp(Src) 98.9 F (37.2 C) (Oral)  Resp 18  Ht  (1.6 m)  Wt 160 lb 1.3 oz (72.612 kg)  BMI 28.36 kg/m2  SpO2 97% VS noted,  Constitutional: Pt is oriented to person, place, and time. Appears well-developed and well-nourished, in no significant distress Head: Normocephalic and atraumatic.  Right Ear: External ear normal.  Left Ear: External ear normal.  Nose: Nose normal.  Mouth/Throat: Oropharynx is clear and moist.  Eyes: Conjunctivae and EOM are normal. Pupils are equal, round, and reactive to light.  Neck: Normal range of motion. Neck supple. No JVD present. No tracheal deviation present or significant neck LA or mass Cardiovascular: Normal rate, regular rhythm, normal heart sounds and intact distal pulses.   Pulmonary/Chest: Effort normal and breath sounds without rales or wheezing  Abdominal: Soft. Bowel sounds are normal. NT. No HSM  Musculoskeletal: Normal range of motion. Exhibits no edema.  Lymphadenopathy:  Has no cervical adenopathy.  Neurological: Pt is alert and oriented to  person, place, and time. Pt has normal reflexes. No cranial nerve deficit. Motor grossly intact Skin: Skin is warm and dry. No rash noted.  Psychiatric:  Has normal mood and affect. Behavior is normal.     Assessment & Plan:

## 2014-05-01 NOTE — Patient Instructions (Addendum)
You had the Pneumovax shot today  Please continue all other medications as before, and refills have been done if requested.  Please have the pharmacy call with any other refills you may need.  Please continue your efforts at being more active, low cholesterol diet, and weight control.  You are otherwise up to date with prevention measures today.  Please keep your appointments with your specialists as you may have planned  Please go to the LAB in the Basement (turn left off the elevator) for the tests to be done today  You will be contacted by phone if any changes need to be made immediately.  Otherwise, you will receive a letter about your results with an explanation, but please check with MyChart first.  Please remember to sign up for MyChart if you have not done so, as this will be important to you in the future with finding out test results, communicating by private email, and scheduling acute appointments online when needed.  Please return in 6 months, or sooner if needed 

## 2014-05-01 NOTE — Assessment & Plan Note (Signed)
stable overall by history and exam, recent data reviewed with pt, and pt to continue medical treatment as before,  to f/u any worsening symptoms or concerns Lab Results  Component Value Date   HGBA1C 6.8* 10/30/2013    for f/u lab

## 2014-05-01 NOTE — Addendum Note (Signed)
Addended by: Maurene CapesPOTTS, Kashmir Lysaght M on: 05/01/2014 05:44 PM   Modules accepted: Orders

## 2014-05-02 ENCOUNTER — Other Ambulatory Visit: Payer: Self-pay | Admitting: Internal Medicine

## 2014-05-02 LAB — LDL CHOLESTEROL, DIRECT: Direct LDL: 59 mg/dL

## 2014-05-02 LAB — HEPATIC FUNCTION PANEL
ALT: 51 U/L (ref 0–53)
AST: 28 U/L (ref 0–37)
Albumin: 4.6 g/dL (ref 3.5–5.2)
Alkaline Phosphatase: 54 U/L (ref 39–117)
Bilirubin, Direct: 0.1 mg/dL (ref 0.0–0.3)
Total Bilirubin: 0.4 mg/dL (ref 0.2–1.2)
Total Protein: 7.5 g/dL (ref 6.0–8.3)

## 2014-05-02 LAB — TSH: TSH: 2.29 u[IU]/mL (ref 0.35–4.50)

## 2014-05-02 LAB — BASIC METABOLIC PANEL
BUN: 20 mg/dL (ref 6–23)
CO2: 26 mEq/L (ref 19–32)
Calcium: 10 mg/dL (ref 8.4–10.5)
Chloride: 104 mEq/L (ref 96–112)
Creatinine, Ser: 1.03 mg/dL (ref 0.40–1.50)
GFR: 78.24 mL/min (ref >60.00)
Glucose, Bld: 146 mg/dL — ABNORMAL HIGH (ref 70–99)
Potassium: 4.3 mEq/L (ref 3.5–5.1)
Sodium: 138 mEq/L (ref 135–145)

## 2014-05-02 LAB — LIPID PANEL
Cholesterol: 156 mg/dL (ref 0–200)
HDL: 56.6 mg/dL (ref >39.00)
NonHDL: 99.4
Total CHOL/HDL Ratio: 3
Triglycerides: 237 mg/dL — ABNORMAL HIGH (ref 0.0–149.0)
VLDL: 47.4 mg/dL — ABNORMAL HIGH (ref 0.0–40.0)

## 2014-05-02 LAB — PSA: PSA: 0.23 ng/mL (ref 0.10–4.00)

## 2014-05-02 MED ORDER — METFORMIN HCL ER 500 MG PO TB24: 500.0000 mg | ORAL_TABLET | Freq: Every day | ORAL | Status: DC

## 2014-05-14 ENCOUNTER — Encounter: Payer: Self-pay | Admitting: *Deleted

## 2014-05-20 ENCOUNTER — Other Ambulatory Visit: Payer: Self-pay | Admitting: Internal Medicine

## 2014-05-30 ENCOUNTER — Encounter: Payer: Self-pay | Admitting: Gastroenterology

## 2014-06-20 ENCOUNTER — Ambulatory Visit (INDEPENDENT_AMBULATORY_CARE_PROVIDER_SITE_OTHER): Payer: PRIVATE HEALTH INSURANCE | Admitting: *Deleted

## 2014-06-20 DIAGNOSIS — I495 Sick sinus syndrome: Secondary | ICD-10-CM

## 2014-06-20 DIAGNOSIS — Z950 Presence of cardiac pacemaker: Secondary | ICD-10-CM

## 2014-06-20 LAB — CUP PACEART INCLINIC DEVICE CHECK
Battery Impedance: 277 Ohm
Battery Remaining Longevity: 113 mo
Battery Voltage: 2.79 V
Brady Statistic AP VP Percent: 0 %
Brady Statistic AP VS Percent: 71 %
Brady Statistic AS VP Percent: 0 %
Brady Statistic AS VS Percent: 28 %
Date Time Interrogation Session: 20160609160752
Lead Channel Impedance Value: 511 Ohm
Lead Channel Impedance Value: 515 Ohm
Lead Channel Pacing Threshold Amplitude: 0.5 V
Lead Channel Pacing Threshold Amplitude: 0.75 V
Lead Channel Pacing Threshold Pulse Width: 0.4 ms
Lead Channel Pacing Threshold Pulse Width: 0.4 ms
Lead Channel Sensing Intrinsic Amplitude: 15.67 mV
Lead Channel Setting Pacing Amplitude: 2 V
Lead Channel Setting Pacing Amplitude: 2.5 V
Lead Channel Setting Pacing Pulse Width: 0.4 ms
Lead Channel Setting Sensing Sensitivity: 4 mV

## 2014-06-20 NOTE — Progress Notes (Signed)
Pacemaker check in clinic. Normal device function. Thresholds, sensing, impedances consistent with previous measurements. Device programmed to maximize longevity. No mode switch or high ventricular rates noted. Device programmed at appropriate safety margins. Histogram distribution appropriate for patient activity level. Device programmed to optimize intrinsic conduction. Estimated longevity 9.59yrs. ROV w/ JA in 72mo.

## 2014-06-26 ENCOUNTER — Ambulatory Visit (INDEPENDENT_AMBULATORY_CARE_PROVIDER_SITE_OTHER): Payer: PRIVATE HEALTH INSURANCE | Admitting: Cardiology

## 2014-06-26 ENCOUNTER — Encounter: Payer: Self-pay | Admitting: Cardiology

## 2014-06-26 VITALS — BP 110/58 | HR 69 | Ht 63.0 in | Wt 162.8 lb

## 2014-06-26 DIAGNOSIS — I2510 Atherosclerotic heart disease of native coronary artery without angina pectoris: Secondary | ICD-10-CM

## 2014-06-26 NOTE — Progress Notes (Signed)
06/26/2014 [name] [name]   04/21/1955  [id number]  Primary Physician Oliver Barre, MD Primary Cardiologist: Dr. Shirlee Latch Electrophysiologist: Dr. Johney Frame  Reason for Visit/CC: 1 year F/U for CAD  HPI:  The patient is a 59 y/o male who presents to clinic today for 1 year f/u for CAD. He is followed by Dr. Shirlee Latch. He presented with an ACS in August 2008. LHC showed multivessel disease and he underwent CABG with a LIMA to the LAD, sequential vein graft to the obtuse marginal - 1 and obtuse marginal -2, and vein graft to the RCA. Adenosine myoview 11/2008 was normal with normal  EF at 64%. He also has a h/o SSS s/p PPM implantation in 2010. This is followed by Dr. Johney Frame. His other PMH includes HTN, diabetes and HLD. His lipid profile is followed by his PCP.  He presents to clinic today for f/u. He reports that he has done well. He denies any chest pain or dyspnea. He exercises regularly on the treadmill without exertional symptoms. He reports full medication compliance. BP and HR are both well controlled. EKG shows NSR w/o any ischemic abnormalities.    Current Outpatient Prescriptions  Medication Sig Dispense Refill  . amLODipine (NORVASC) 5 MG tablet TAKE 1 TABLET BY MOUTH EVERY DAY 90 tablet 0  . aspirin 81 MG tablet Take 1 tablet (81 mg total) by mouth daily.    [name] atorvastatin (LIPITOR) 40 MG tablet Take 1 tablet (40 mg total) by mouth daily at 6 PM. 90 tablet 3  . lisinopril (PRINIVIL,ZESTRIL) 40 MG tablet TAKE 1 TABLET BY MOUTH EVERY DAY 90 tablet 0  . meloxicam (MOBIC) 15 MG tablet TAKE 1 TABLET BY MOUTH EVERY DAY 30 tablet 11  . metFORMIN (GLUCOPHAGE-XR) 500 MG 24 hr tablet Take 1 tablet (500 mg total) by mouth daily with breakfast. 90 tablet 3  . metoprolol (LOPRESSOR) 50 MG tablet TAKE 1 TABLET BY MOUTH TWICE DAILY 180 tablet 0  . Omega-3 Fatty Acids (OMEGA-3 FISH OIL) 1200 MG CAPS Take 1 capsule by mouth 2 (two) times daily.     [name] omeprazole (PRILOSEC) 20 MG capsule TAKE 1 CAPSULE BY MOUTH  TWICE DAILY 180 capsule 2  . traMADol (ULTRAM) 50 MG tablet Take 1 tablet (50 mg total) by mouth at bedtime as needed. 30 tablet 0   No current facility-administered medications for this visit.    No Known Allergies  History   Social History  . Marital Status: Married    Spouse Name: N/A  . Number of Children: N/A  . Years of Education: N/A   Occupational History  . Not on file.   Social History Main Topics  . Smoking status: Former Smoker -- 1.00 packs/day for 20 years    Types: Cigarettes    Quit date: 01/11/2006  . Smokeless tobacco: Never Used  . Alcohol Use: 1.2 oz/week    2 Cans of beer per week     Comment: social on weekends  . Drug Use: No  . Sexual Activity: Not on file   Other Topics Concern  . Not on file   Social History Narrative   Married, 3 children. To [place] from [place] 1979. Work- sign spray pain- graphic systems     Review of Systems: General: negative for chills, fever, night sweats or weight changes.  Cardiovascular: negative for chest pain, dyspnea on exertion, edema, orthopnea, palpitations, paroxysmal nocturnal dyspnea or shortness of breath Dermatological: negative for rash Respiratory: negative for cough or wheezing Urologic:  negative for hematuria Abdominal: negative for nausea, vomiting, diarrhea, bright red blood per rectum, melena, or hematemesis Neurologic: negative for visual changes, syncope, or dizziness All other systems reviewed and are otherwise negative except as noted above.    Blood pressure 110/58, pulse 69, height  (1.6 m), weight 162 lb 12.8 oz (73.846 kg), SpO2 95 %.  General appearance: alert, cooperative and no distress Neck: no carotid bruit and no JVD Lungs: clear to auscultation bilaterally Heart: regular rate and rhythm, S1, S2 normal, no murmur, click, rub or gallop Extremities: no LEE Pulses: 2+ and symmetric Skin: warm and dry Neurologic: Grossly normal  EKG NSR 62 bpm   ASSESSMENT AND PLAN:    1.  CAD: s/p CABG in 2008. Stable w/o angina. No exertional symptoms with exercise. No EKG abnormalities. Continue medical therapy for secondary prevention: ASA, BB, ACE-I, statin.   2. SSS: s/p PPM. Followed by Dr. Johney Frame. I discussed with Device Clinic today, plan is to f/u with Dr. Johney Frame in 6 months.  3. HTN: BP well controlled on current regimen at 110/58. Continue amlodipine, metoprolol and lisinopril.  4. HLD: LFP followed by PCP. VLDL 04/2014 slightly elevated at 47.4. Continue statin therapy with Lipitor.  5. DM: most recent Hgb A1c 7.4. Goal is <7.0. On Metformin. Followed by PCP.   PLAN  Patient appears to be doing well from a CV standpoint. Continue current regimen. F/U with Dr. Shirlee Latch in 6 months. F/u with Dr. Johney Frame in 6 months for device check.   Robbie Lis PA-C 06/26/2014 4:04 PM

## 2014-06-26 NOTE — Patient Instructions (Signed)
Medication Instructions: Your physician recommends that you continue on your current medications as directed. Please refer to the Current Medication list given to you today.   Labwork:   Testing/Procedures:   Follow-Up:  Your physician wants you to follow-up in:  IN 6  MONTHS WITH DR Johney Frame AND [facility]  You will receive a reminder letter in the mail two months in advance. If you don't receive a letter, please call our office to schedule the follow-up appointment.   Any Other Special Instructions Will Be Listed Below (If Applicable).

## 2014-07-16 ENCOUNTER — Other Ambulatory Visit: Payer: Self-pay | Admitting: Internal Medicine

## 2014-07-17 ENCOUNTER — Encounter: Payer: Self-pay | Admitting: Internal Medicine

## 2014-10-31 ENCOUNTER — Other Ambulatory Visit (INDEPENDENT_AMBULATORY_CARE_PROVIDER_SITE_OTHER): Payer: PRIVATE HEALTH INSURANCE

## 2014-10-31 ENCOUNTER — Encounter: Payer: Self-pay | Admitting: Internal Medicine

## 2014-10-31 ENCOUNTER — Ambulatory Visit (INDEPENDENT_AMBULATORY_CARE_PROVIDER_SITE_OTHER): Payer: PRIVATE HEALTH INSURANCE | Admitting: Internal Medicine

## 2014-10-31 VITALS — BP 138/80 | HR 72 | Temp 98.0°F | Wt 157.0 lb

## 2014-10-31 DIAGNOSIS — E785 Hyperlipidemia, unspecified: Secondary | ICD-10-CM | POA: Diagnosis not present

## 2014-10-31 DIAGNOSIS — Z0000 Encounter for general adult medical examination without abnormal findings: Secondary | ICD-10-CM

## 2014-10-31 DIAGNOSIS — E119 Type 2 diabetes mellitus without complications: Secondary | ICD-10-CM

## 2014-10-31 DIAGNOSIS — I119 Hypertensive heart disease without heart failure: Secondary | ICD-10-CM

## 2014-10-31 DIAGNOSIS — Z0189 Encounter for other specified special examinations: Secondary | ICD-10-CM | POA: Diagnosis not present

## 2014-10-31 LAB — BASIC METABOLIC PANEL
BUN: 18 mg/dL (ref 6–23)
CO2: 29 mEq/L (ref 19–32)
Calcium: 9.8 mg/dL (ref 8.4–10.5)
Chloride: 106 mEq/L (ref 96–112)
Creatinine, Ser: 1.03 mg/dL (ref 0.40–1.50)
GFR: 78.11 mL/min (ref >60.00)
Glucose, Bld: 142 mg/dL — ABNORMAL HIGH (ref 70–99)
Potassium: 5.1 mEq/L (ref 3.5–5.1)
Sodium: 140 mEq/L (ref 135–145)

## 2014-10-31 LAB — LIPID PANEL
Cholesterol: 163 mg/dL (ref 0–200)
HDL: 55.4 mg/dL (ref >39.00)
Total CHOL/HDL Ratio: 3
Triglycerides: 411 mg/dL — ABNORMAL HIGH (ref 0.0–149.0)

## 2014-10-31 LAB — HEPATIC FUNCTION PANEL
ALT: 35 U/L (ref 0–53)
AST: 20 U/L (ref 0–37)
Albumin: 4.5 g/dL (ref 3.5–5.2)
Alkaline Phosphatase: 43 U/L (ref 39–117)
Bilirubin, Direct: 0 mg/dL (ref 0.0–0.3)
Total Bilirubin: 0.3 mg/dL (ref 0.2–1.2)
Total Protein: 7.4 g/dL (ref 6.0–8.3)

## 2014-10-31 LAB — HEMOGLOBIN A1C: Hgb A1c MFr Bld: 6.8 % — ABNORMAL HIGH (ref 4.6–6.5)

## 2014-10-31 NOTE — Patient Instructions (Signed)

## 2014-10-31 NOTE — Progress Notes (Signed)
Subjective:    Patient ID: [name] [name], male    DOB: 12/04/1955, 59 y.o.   MRN: [id number]  HPI  Here to f/u; overall doing ok,  Pt denies chest pain, increasing sob or doe, wheezing, orthopnea, PND, increased Crystal swelling, palpitations, dizziness or syncope.  Pt denies new neurological symptoms such as new headache, or facial or extremity weakness or numbness.  Pt denies polydipsia, polyuria, or low sugar episode.   Pt denies new neurological symptoms such as new headache, or facial or extremity weakness or numbness.   Pt states overall good compliance with meds, mostly trying to follow appropriate diet, with wt overall stable,  but little exercise however.  No current complaints Past Medical History  Diagnosis Date  . CAD (coronary artery disease)     The patient presented in Aug 2008 with acute cornary syndrome. He did have a bypass surgeru. He did have a 3-vessel disease on heart cathiterization and had bypass surgery with LIMA to the LAD, sequential vein graft to the obtuse marginal - 1 and obtuse marginal -2, and vein graft to the RCA. Adenosine myoview (11/10): EF 64%, normal wall motion, normal perfusion.   [name]. History of echocardiogram     November 2008. EF 65-70%. no regional wall abnormalities. Trivial mitral regurgitation.  [name]. GERD (gastroesophageal reflux disease)   . HTN (hypertension)   . Hyperlipidemia   . Sick sinus syndrome (HCC)     with presyncope. medtronic dual chamber PCM placed 11/10  . Type II or unspecified type diabetes mellitus without mention of complication, not stated as uncontrolled   . Mild anemia     Hx of post op. 8/08  . Paroxysmal atrial fibrillation (HCC)     only episode noted breifly during hospitalization for PCM placement 11/10.    Past Surgical History  Procedure Laterality Date  . Coronary artery bypass graft    . Rectal abcess      2005. Dr. Carolynne Edouardoth with interanal sphincterotomy  . Pacemaker insertion      MDT implanted 2010    reports that he quit  smoking about 8 years ago. His smoking use included Cigarettes. He has a 20 pack-year smoking history. He has never used smokeless tobacco. He reports that he drinks about 1.2 oz of alcohol per week. He reports that he does not use illicit drugs. family history is negative for Colon cancer. No Known Allergies Current Outpatient Prescriptions on File Prior to Visit  Medication Sig Dispense Refill  . amLODipine (NORVASC) 5 MG tablet TAKE 1 TABLET BY MOUTH EVERY DAY 90 tablet 0  . aspirin 81 MG tablet Take 1 tablet (81 mg total) by mouth daily.    [name]. atorvastatin (LIPITOR) 40 MG tablet Take 1 tablet (40 mg total) by mouth daily at 6 PM. 90 tablet 3  . lisinopril (PRINIVIL,ZESTRIL) 40 MG tablet TAKE 1 TABLET BY MOUTH EVERY DAY 90 tablet 0  . meloxicam (MOBIC) 15 MG tablet TAKE 1 TABLET BY MOUTH EVERY DAY 30 tablet 11  . metFORMIN (GLUCOPHAGE-XR) 500 MG 24 hr tablet Take 1 tablet (500 mg total) by mouth daily with breakfast. 90 tablet 3  . metoprolol (LOPRESSOR) 50 MG tablet TAKE 1 TABLET BY MOUTH TWICE DAILY 180 tablet 0  . Omega-3 Fatty Acids (OMEGA-3 FISH OIL) 1200 MG CAPS Take 1 capsule by mouth 2 (two) times daily.     [name]. omeprazole (PRILOSEC) 20 MG capsule TAKE 1 CAPSULE BY MOUTH TWICE DAILY 180 capsule 1  . traMADol (  ULTRAM) 50 MG tablet Take 1 tablet (50 mg total) by mouth at bedtime as needed. 30 tablet 0   No current facility-administered medications on file prior to visit.   Review of Systems  Constitutional: Negative for unusual diaphoresis or night sweats HENT: Negative for ringing in ear or discharge Eyes: Negative for double vision or worsening visual disturbance.  Respiratory: Negative for choking and stridor.   Gastrointestinal: Negative for vomiting or other signifcant bowel change Genitourinary: Negative for hematuria or change in urine volume.  Musculoskeletal: Negative for other MSK pain or swelling Skin: Negative for color change and worsening wound.  Neurological: Negative  for tremors and numbness other than noted  Psychiatric/Behavioral: Negative for decreased concentration or agitation other than above       Objective:   Physical Exam BP 138/80 mmHg  Pulse 72  Temp(Src) 98 F (36.7 C)  Wt 157 lb (71.215 kg)  SpO2 94% VS noted,  Constitutional: Pt appears in no significant distress HENT: Head: NCAT.  Right Ear: External ear normal.  Left Ear: External ear normal.  Eyes: . Pupils are equal, round, and reactive to light. Conjunctivae and EOM are normal Neck: Normal range of motion. Neck supple.  Cardiovascular: Normal rate and regular rhythm.   Pulmonary/Chest: Effort normal and breath sounds without rales or wheezing.  Abd:  Soft, NT, ND, + BS Neurological: Pt is alert. Not confused , motor grossly intact Skin: Skin is warm. No rash, no [place] edema Psychiatric: Pt behavior is normal. No agitation.     Assessment & Plan:

## 2014-10-31 NOTE — Progress Notes (Signed)
Patient received education resource, including the self-management goal and tool. Patient verbalized understanding. 

## 2014-11-01 LAB — LDL CHOLESTEROL, DIRECT: Direct LDL: 69 mg/dL

## 2014-11-02 NOTE — Assessment & Plan Note (Signed)
stable overall by history and exam, recent data reviewed with pt, and pt to continue medical treatment as before,  to f/u any worsening symptoms or concerns Lab Results  Component Value Date   HGBA1C 6.8* 10/31/2014    

## 2014-11-02 NOTE — Assessment & Plan Note (Signed)
stable overall by history and exam, recent data reviewed with pt, and pt to continue medical treatment as before,  to f/u any worsening symptoms or concerns Lab Results  Component Value Date   LDLCALC 64 04/26/2013

## 2014-11-02 NOTE — Assessment & Plan Note (Signed)
stable overall by history and exam, recent data reviewed with pt, and pt to continue medical treatment as before,  to f/u any worsening symptoms or concerns BP Readings from Last 3 Encounters:  10/31/14 138/80  06/26/14 110/58  05/01/14 126/80

## 2014-11-12 ENCOUNTER — Encounter: Payer: Self-pay | Admitting: *Deleted

## 2014-11-17 ENCOUNTER — Other Ambulatory Visit: Payer: Self-pay | Admitting: Internal Medicine

## 2014-12-25 ENCOUNTER — Encounter: Payer: Self-pay | Admitting: Internal Medicine

## 2014-12-25 ENCOUNTER — Ambulatory Visit (INDEPENDENT_AMBULATORY_CARE_PROVIDER_SITE_OTHER): Payer: PRIVATE HEALTH INSURANCE | Admitting: Internal Medicine

## 2014-12-25 VITALS — BP 112/68 | HR 82 | Ht 63.0 in | Wt 165.4 lb

## 2014-12-25 DIAGNOSIS — I4891 Unspecified atrial fibrillation: Secondary | ICD-10-CM | POA: Diagnosis not present

## 2014-12-25 DIAGNOSIS — I119 Hypertensive heart disease without heart failure: Secondary | ICD-10-CM | POA: Diagnosis not present

## 2014-12-25 DIAGNOSIS — Z950 Presence of cardiac pacemaker: Secondary | ICD-10-CM

## 2014-12-25 DIAGNOSIS — I495 Sick sinus syndrome: Secondary | ICD-10-CM | POA: Diagnosis not present

## 2014-12-25 NOTE — Progress Notes (Signed)
PCP:  Oliver BarreJames John, MD  The patient presents today for routine electrophysiology followup.  Since last being seen in our clinic, the patient reports doing very well.  Today, he denies symptoms of palpitations, chest pain, shortness of breath, orthopnea, PND, lower extremity edema, dizziness, presyncope, syncope, or neurologic sequela.  The patient feels that he is tolerating medications without difficulties and is otherwise without complaint today.   Past Medical History  Diagnosis Date  . CAD (coronary artery disease)     The patient presented in Aug 2008 with acute cornary syndrome. He did have a bypass surgeru. He did have a 3-vessel disease on heart cathiterization and had bypass surgery with LIMA to the LAD, sequential vein graft to the obtuse marginal - 1 and obtuse marginal -2, and vein graft to the RCA. Adenosine myoview (11/10): EF 64%, normal wall motion, normal perfusion.   [name]. History of echocardiogram     November 2008. EF 65-70%. no regional wall abnormalities. Trivial mitral regurgitation.  [name]. GERD (gastroesophageal reflux disease)   . HTN (hypertension)   . Hyperlipidemia   . Sick sinus syndrome (HCC)     with presyncope. medtronic dual chamber PCM placed 11/10  . Type II or unspecified type diabetes mellitus without mention of complication, not stated as uncontrolled   . Mild anemia     Hx of post op. 8/08  . Paroxysmal atrial fibrillation (HCC)     only episode noted breifly during hospitalization for PCM placement 11/10.    Past Surgical History  Procedure Laterality Date  . Coronary artery bypass graft    . Rectal abcess      2005. Dr. Carolynne Edouardoth with interanal sphincterotomy  . Pacemaker insertion      MDT implanted 2010    Current Outpatient Prescriptions  Medication Sig Dispense Refill  . amLODipine (NORVASC) 5 MG tablet TAKE 1 TABLET BY MOUTH EVERY DAY 90 tablet 0  . aspirin 81 MG tablet Take 1 tablet (81 mg total) by mouth daily.    [name]. atorvastatin (LIPITOR) 40 MG  tablet Take 1 tablet (40 mg total) by mouth daily at 6 PM. 90 tablet 3  . lisinopril (PRINIVIL,ZESTRIL) 40 MG tablet TAKE 1 TABLET BY MOUTH EVERY DAY 90 tablet 0  . meloxicam (MOBIC) 15 MG tablet TAKE 1 TABLET BY MOUTH EVERY DAY 30 tablet 11  . metFORMIN (GLUCOPHAGE-XR) 500 MG 24 hr tablet Take 1 tablet (500 mg total) by mouth daily with breakfast. 90 tablet 3  . metoprolol (LOPRESSOR) 50 MG tablet TAKE 1 TABLET BY MOUTH TWICE DAILY 180 tablet 1  . Omega-3 Fatty Acids (OMEGA-3 FISH OIL) 1200 MG CAPS Take 1 capsule by mouth 2 (two) times daily.     [name]. omeprazole (PRILOSEC) 20 MG capsule TAKE 1 CAPSULE BY MOUTH TWICE DAILY 180 capsule 1  . traMADol (ULTRAM) 50 MG tablet Take 50 mg by mouth at bedtime as needed (pain).     No current facility-administered medications for this visit.    No Known Allergies  Social History   Social History  . Marital Status: Married    Spouse Name: N/A  . Number of Children: N/A  . Years of Education: N/A   Occupational History  . Not on file.   Social History Main Topics  . Smoking status: Former Smoker -- 1.00 packs/day for 20 years    Types: Cigarettes    Quit date: 01/11/2006  . Smokeless tobacco: Never Used  . Alcohol Use: 1.2 oz/week    2  Cans of beer per week     Comment: social on weekends  . Drug Use: No  . Sexual Activity: Not on file   Other Topics Concern  . Not on file   Social History Narrative   Married, 3 children. To [place] from [place] 1979. 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   Head- normocephalic, atraumatic Eyes-  Sclera clear, conjunctiva pink Ears- hearing intact Oropharynx- clear Neck- supple, no JVP Lymph- no cervical lymphadenopathy Lungs- Clear  to ausculation bilaterally, normal work of breathing Chest- pacemaker pocket is well healed Heart- Regular rate and rhythm, no murmurs, rubs or gallops, PMI not laterally displaced GI- soft, NT, ND, + BS Extremities- no clubbing, cyanosis, or edema Neuro- strength and sensation are intact  Pacemaker interrogation- reviewed in detail today,  See PACEART report  Assessment and Plan:  1. Sick sinus syndrome Normal pacemaker function See Pace Art report No changes today  2. CAD No ischemic symptoms  3. Hypertensive cardiovascular disease Stable No change required today  My Carelin smart given to patient today.  Carelink every 3 months Return to see EP NP in 1 year Follow-up with Dr Shirlee Latch as scheduled  Hillis Range MD, [facility] 12/25/2014 7:52 PM

## 2014-12-25 NOTE — Patient Instructions (Signed)
Medication Instructions:  Your physician recommends that you continue on your current medications as directed. Please refer to the Current Medication list given to you today.   Labwork: None ordered   Testing/Procedures: None ordered   Follow-Up: Your physician wants you to follow-up in: 12 months with Gypsy BalsamAmber Seiler, NP You will receive a reminder letter in the mail two months in advance. If you don't receive a letter, please call our office to schedule the follow-up appointment.  Remote monitoring is used to monitor your Pacemaker from home. This monitoring reduces the number of office visits required to check your device to one time per year. It allows us to keep an eye on the functioning of your device to ensure it is working properly. You are scheduled for a device check from home on 03/26/15. You may send your transmission at any time that day. If you have a wireless device, the transmission will be sent automatically. After your physician reviews your transmission, you will receive a postcard with your next transmission date.    Any Other Special Instructions Will Be Listed Below (If Applicable).     If you need a refill on your cardiac medications before your next appointment, please call your pharmacy.

## 2015-01-02 LAB — CUP PACEART INCLINIC DEVICE CHECK
Battery Impedance: 325 Ohm
Battery Remaining Longevity: 108 mo
Battery Voltage: 2.79 V
Brady Statistic AP VP Percent: 0 %
Brady Statistic AP VS Percent: 76 %
Brady Statistic AS VP Percent: 0 %
Brady Statistic AS VS Percent: 24 %
Date Time Interrogation Session: 20161214213744
Implantable Lead Implant Date: 20101101
Implantable Lead Implant Date: 20101101
Implantable Lead Location: 753859
Implantable Lead Location: 753860
Implantable Lead Model: 5076
Implantable Lead Model: 5076
Lead Channel Impedance Value: 532 Ohm
Lead Channel Impedance Value: 537 Ohm
Lead Channel Pacing Threshold Amplitude: 0.5 V
Lead Channel Pacing Threshold Amplitude: 0.75 V
Lead Channel Pacing Threshold Pulse Width: 0.4 ms
Lead Channel Pacing Threshold Pulse Width: 0.4 ms
Lead Channel Sensing Intrinsic Amplitude: 11.2 mV
Lead Channel Setting Pacing Amplitude: 2 V
Lead Channel Setting Pacing Amplitude: 2.5 V
Lead Channel Setting Pacing Pulse Width: 0.4 ms
Lead Channel Setting Sensing Sensitivity: 4 mV

## 2015-01-08 ENCOUNTER — Other Ambulatory Visit: Payer: Self-pay | Admitting: Internal Medicine

## 2015-01-16 ENCOUNTER — Other Ambulatory Visit: Payer: Self-pay | Admitting: Internal Medicine

## 2015-01-19 ENCOUNTER — Other Ambulatory Visit: Payer: Self-pay | Admitting: Internal Medicine

## 2015-02-27 ENCOUNTER — Encounter: Payer: Self-pay | Admitting: *Deleted

## 2015-03-07 ENCOUNTER — Ambulatory Visit (INDEPENDENT_AMBULATORY_CARE_PROVIDER_SITE_OTHER): Payer: BLUE CROSS/BLUE SHIELD | Admitting: Cardiology

## 2015-03-07 ENCOUNTER — Encounter: Payer: Self-pay | Admitting: Cardiology

## 2015-03-07 VITALS — BP 134/66 | HR 65 | Ht 63.0 in | Wt 164.0 lb

## 2015-03-07 DIAGNOSIS — I4891 Unspecified atrial fibrillation: Secondary | ICD-10-CM

## 2015-03-07 DIAGNOSIS — I2510 Atherosclerotic heart disease of native coronary artery without angina pectoris: Secondary | ICD-10-CM

## 2015-03-07 DIAGNOSIS — E785 Hyperlipidemia, unspecified: Secondary | ICD-10-CM

## 2015-03-07 DIAGNOSIS — I495 Sick sinus syndrome: Secondary | ICD-10-CM | POA: Diagnosis not present

## 2015-03-07 NOTE — Patient Instructions (Signed)
Medication Instructions:  Your physician recommends that you continue on your current medications as directed. Please refer to the Current Medication list given to you today.   Labwork: Your physician recommends that you return for lab work in: NEXT WEEK - cholesterol You will need to FAST for this appointment - nothing to eat or drink after midnight the night before except water.    Testing/Procedures: None Ordered   Follow-Up: Your physician wants you to follow-up in: 1 year with Dr. Shirlee Latch.  You will receive a reminder letter in the mail two months in advance. If you don't receive a letter, please call our office to schedule the follow-up appointment.   If you need a refill on your cardiac medications before your next appointment, please call your pharmacy.

## 2015-03-09 NOTE — Progress Notes (Signed)
Patient ID: [name] [name], male   DOB: Jun 10, 1955, 60 y.o.   MRN: [id number] PCP: Dr. Jonny [name]  [name] is a 60 yo with male with history of CAD s/p CABG and bradycardia s/p Medtronic pacemaker who presents today for cardiology followup.  He says he has been doing well.  He is working full time as a Librarian, academic.  No exertional dyspnea or chest pain. No palpitations or lightheadedness.  BP is controlled.  Labs (8/12): LDL 36, HDl 64, TGs 138, K 4.7, creatinine 1.0 Labs (2/14): K 5.1, creatinine 1.2, TSH normal, LDL 63 Labs (10/16): LDL 69, HDL 55, TGs 411, K 5.1, creatinine 1.03  ECG: a-paced, nonspecific T wave changes  Past Medical History  Diagnosis Date  . CAD (coronary artery disease)     The patient presented in Aug 2008 with acute cornary syndrome. He did have a bypass surgeru. He did have a 3-vessel disease on heart cathiterization and had bypass surgery with LIMA to the LAD, sequential vein graft to the obtuse marginal - 1 and obtuse marginal -2, and vein graft to the RCA. Adenosine myoview (11/10): EF 64%, normal wall motion, normal perfusion.   [name] History of echocardiogram     November 2008. EF 65-70%. no regional wall abnormalities. Trivial mitral regurgitation.  [name] GERD (gastroesophageal reflux disease)   . HTN (hypertension)   . Hyperlipidemia   . Sick sinus syndrome (HCC)     with presyncope. medtronic dual chamber PCM placed 11/10  . Type II or unspecified type diabetes mellitus without mention of complication, not stated as uncontrolled   . Mild anemia     Hx of post op. 8/08  . Paroxysmal atrial fibrillation (HCC)     only episode noted breifly during hospitalization for PCM placement 11/10.    Social History   Social History  . Marital Status: Married    Spouse Name: N/A  . Number of Children: N/A  . Years of Education: N/A   Occupational History  . Not on file.   Social History Main Topics  . Smoking status: Former Smoker -- 1.00 packs/day for 20 years    Types:  Cigarettes    Quit date: 01/11/2006  . Smokeless tobacco: Never Used  . Alcohol Use: 1.2 oz/week    2 Cans of beer per week     Comment: social on weekends  . Drug Use: No  . Sexual Activity: Not on file   Other Topics Concern  . Not on file   Social History Narrative   Married, 3 children. To [place] from [place] 1979. Work- sign spray pain- [employer]   Family history: No premature CAD.  Current Outpatient Prescriptions  Medication Sig Dispense Refill  . amLODipine (NORVASC) 5 MG tablet TAKE 1 TABLET BY MOUTH DAILY 90 tablet 1  . aspirin 81 MG tablet Take 1 tablet (81 mg total) by mouth daily.    [name] atorvastatin (LIPITOR) 40 MG tablet TAKE 1 TABLET BY MOUTH DAILY AT 6 PM 90 tablet 1  . lisinopril (PRINIVIL,ZESTRIL) 40 MG tablet TAKE 1 TABLET BY MOUTH EVERY DAY 90 tablet 1  . meloxicam (MOBIC) 15 MG tablet TAKE 1 TABLET BY MOUTH EVERY DAY 30 tablet 11  . metFORMIN (GLUCOPHAGE-XR) 500 MG 24 hr tablet Take 1 tablet (500 mg total) by mouth daily with breakfast. 90 tablet 3  . metoprolol (LOPRESSOR) 50 MG tablet TAKE 1 TABLET BY MOUTH TWICE DAILY 180 tablet 1  . Omega-3 Fatty Acids (OMEGA-3 FISH OIL) 1200  MG CAPS Take 1 capsule by mouth 2 (two) times daily.     [name] omeprazole (PRILOSEC) 20 MG capsule TAKE 1 CAPSULE BY MOUTH TWICE DAILY 180 capsule 0  . traMADol (ULTRAM) 50 MG tablet Take 50 mg by mouth at bedtime as needed (pain).     No current facility-administered medications for this visit.     PHYSICAL EXAM: Filed Vitals:   03/07/15 1603  BP: 134/66  Pulse: 65   General:  Well appearing. No resp difficulty HEENT: normal Neck: supple. JVP flat. Carotids 2+ bilaterally; no bruits. No lymphadenopathy or thryomegaly appreciated. Cor: PMI normal. Regular rate & rhythm. No rubs, gallops or murmurs. Lungs: clear Abdomen: soft, nontender, nondistended. No hepatosplenomegaly. No bruits or masses. Good bowel sounds. Extremities: no cyanosis, clubbing, rash, edema Neuro: alert &  orientedx3, cranial nerves grossly intact. Moves all 4 extremities w/o difficulty. Affect pleasant.  Assessment/Plan: 1. CAD: Status post CABG.  EF preserved by 2008 echo.  No ischemic symptoms.  Continue ASA, statin, lisinopril, and metoprolol. 2. Bradycardia: Has PCM and regular followup with Dr. Johney [name].  3. HTN: BP is under good control. 4. Hyperlipidemia: Good LDL in 10/16.  Continue statin. Triglycerides were quite high at that time, will recheck and treat if remain that high.   [name] [name] 03/09/2015

## 2015-03-12 ENCOUNTER — Other Ambulatory Visit (INDEPENDENT_AMBULATORY_CARE_PROVIDER_SITE_OTHER): Payer: BLUE CROSS/BLUE SHIELD | Admitting: *Deleted

## 2015-03-12 DIAGNOSIS — E785 Hyperlipidemia, unspecified: Secondary | ICD-10-CM

## 2015-03-12 DIAGNOSIS — I4891 Unspecified atrial fibrillation: Secondary | ICD-10-CM

## 2015-03-12 LAB — LIPID PANEL
Cholesterol: 137 mg/dL (ref 125–200)
HDL: 55 mg/dL (ref >=40)
LDL Cholesterol: 60 mg/dL (ref <130)
Total CHOL/HDL Ratio: 2.5 Ratio (ref <=5.0)
Triglycerides: 108 mg/dL (ref <150)
VLDL: 22 mg/dL (ref <30)

## 2015-03-12 NOTE — Addendum Note (Signed)
Addended by: Tonita Phoenix on: 03/12/2015 07:42 AM   Modules accepted: Orders

## 2015-03-26 ENCOUNTER — Ambulatory Visit (INDEPENDENT_AMBULATORY_CARE_PROVIDER_SITE_OTHER): Payer: BLUE CROSS/BLUE SHIELD | Admitting: *Deleted

## 2015-03-26 ENCOUNTER — Telehealth: Payer: Self-pay | Admitting: Cardiology

## 2015-03-26 DIAGNOSIS — I495 Sick sinus syndrome: Secondary | ICD-10-CM

## 2015-03-26 NOTE — Progress Notes (Signed)
Remote pacemaker transmission.   

## 2015-03-26 NOTE — Telephone Encounter (Signed)
Spoke with pt and reminded pt of remote transmission that is due today. Pt verbalized understanding.   

## 2015-04-07 ENCOUNTER — Encounter: Payer: Self-pay | Admitting: Cardiology

## 2015-04-07 LAB — CUP PACEART REMOTE DEVICE CHECK
Battery Impedance: 349 Ohm
Battery Remaining Longevity: 104 mo
Battery Voltage: 2.79 V
Brady Statistic AP VP Percent: 0 %
Brady Statistic AP VS Percent: 76 %
Brady Statistic AS VP Percent: 0 %
Brady Statistic AS VS Percent: 24 %
Date Time Interrogation Session: 20170315181715
Implantable Lead Implant Date: 20101101
Implantable Lead Implant Date: 20101101
Implantable Lead Location: 753859
Implantable Lead Location: 753860
Implantable Lead Model: 5076
Implantable Lead Model: 5076
Lead Channel Impedance Value: 532 Ohm
Lead Channel Impedance Value: 547 Ohm
Lead Channel Pacing Threshold Amplitude: 0.625 V
Lead Channel Pacing Threshold Amplitude: 0.625 V
Lead Channel Pacing Threshold Pulse Width: 0.4 ms
Lead Channel Pacing Threshold Pulse Width: 0.4 ms
Lead Channel Sensing Intrinsic Amplitude: 1 mV
Lead Channel Sensing Intrinsic Amplitude: 11.2 mV
Lead Channel Setting Pacing Amplitude: 2 V
Lead Channel Setting Pacing Amplitude: 2.5 V
Lead Channel Setting Pacing Pulse Width: 0.4 ms
Lead Channel Setting Sensing Sensitivity: 5.6 mV

## 2015-04-22 ENCOUNTER — Other Ambulatory Visit: Payer: Self-pay | Admitting: Internal Medicine

## 2015-04-22 ENCOUNTER — Encounter: Payer: Self-pay | Admitting: Cardiology

## 2015-04-26 ENCOUNTER — Other Ambulatory Visit: Payer: Self-pay | Admitting: Internal Medicine

## 2015-05-01 ENCOUNTER — Ambulatory Visit: Payer: PRIVATE HEALTH INSURANCE | Admitting: Internal Medicine

## 2015-05-09 ENCOUNTER — Ambulatory Visit: Payer: BLUE CROSS/BLUE SHIELD | Admitting: Internal Medicine

## 2015-05-09 DIAGNOSIS — Z0289 Encounter for other administrative examinations: Secondary | ICD-10-CM

## 2015-05-20 ENCOUNTER — Other Ambulatory Visit: Payer: Self-pay | Admitting: Internal Medicine

## 2015-05-29 ENCOUNTER — Ambulatory Visit (INDEPENDENT_AMBULATORY_CARE_PROVIDER_SITE_OTHER): Payer: BLUE CROSS/BLUE SHIELD | Admitting: Internal Medicine

## 2015-05-29 ENCOUNTER — Other Ambulatory Visit (INDEPENDENT_AMBULATORY_CARE_PROVIDER_SITE_OTHER): Payer: BLUE CROSS/BLUE SHIELD

## 2015-05-29 ENCOUNTER — Encounter: Payer: Self-pay | Admitting: Internal Medicine

## 2015-05-29 VITALS — BP 118/70 | HR 77 | Temp 98.4°F | Resp 20 | Wt 165.0 lb

## 2015-05-29 DIAGNOSIS — Z1159 Encounter for screening for other viral diseases: Secondary | ICD-10-CM

## 2015-05-29 DIAGNOSIS — E119 Type 2 diabetes mellitus without complications: Secondary | ICD-10-CM

## 2015-05-29 DIAGNOSIS — M7551 Bursitis of right shoulder: Secondary | ICD-10-CM

## 2015-05-29 DIAGNOSIS — R7989 Other specified abnormal findings of blood chemistry: Secondary | ICD-10-CM

## 2015-05-29 DIAGNOSIS — Z0001 Encounter for general adult medical examination with abnormal findings: Secondary | ICD-10-CM | POA: Diagnosis not present

## 2015-05-29 DIAGNOSIS — I119 Hypertensive heart disease without heart failure: Secondary | ICD-10-CM

## 2015-05-29 DIAGNOSIS — E785 Hyperlipidemia, unspecified: Secondary | ICD-10-CM

## 2015-05-29 DIAGNOSIS — R6889 Other general symptoms and signs: Secondary | ICD-10-CM | POA: Diagnosis not present

## 2015-05-29 LAB — URINALYSIS, ROUTINE W REFLEX MICROSCOPIC
Hgb urine dipstick: NEGATIVE
Leukocytes, UA: NEGATIVE
Nitrite: NEGATIVE
RBC / HPF: NONE SEEN (ref 0–?)
Specific Gravity, Urine: >=1.03 — AB (ref 1.000–1.030)
Urine Glucose: 250 — AB
Urobilinogen, UA: 0.2 (ref 0.0–1.0)
pH: 5 (ref 5.0–8.0)

## 2015-05-29 LAB — CBC WITH DIFFERENTIAL/PLATELET
Basophils Absolute: 0.1 10*3/uL (ref 0.0–0.1)
Basophils Relative: 1 % (ref 0.0–3.0)
Eosinophils Absolute: 0.5 10*3/uL (ref 0.0–0.7)
Eosinophils Relative: 7 % — ABNORMAL HIGH (ref 0.0–5.0)
HCT: 40.2 % (ref 39.0–52.0)
Hemoglobin: 13.8 g/dL (ref 13.0–17.0)
Lymphocytes Relative: 26.7 % (ref 12.0–46.0)
Lymphs Abs: 2 10*3/uL (ref 0.7–4.0)
MCHC: 34.2 g/dL (ref 30.0–36.0)
MCV: 90.2 fl (ref 78.0–100.0)
Monocytes Absolute: 0.6 10*3/uL (ref 0.1–1.0)
Monocytes Relative: 7.8 % (ref 3.0–12.0)
Neutro Abs: 4.3 10*3/uL (ref 1.4–7.7)
Neutrophils Relative %: 57.5 % (ref 43.0–77.0)
Platelets: 260 10*3/uL (ref 150.0–400.0)
RBC: 4.46 Mil/uL (ref 4.22–5.81)
RDW: 12.1 % (ref 11.5–15.5)
WBC: 7.4 10*3/uL (ref 4.0–10.5)

## 2015-05-29 LAB — LIPID PANEL
Cholesterol: 178 mg/dL (ref 0–200)
HDL: 46.4 mg/dL (ref >39.00)
Total CHOL/HDL Ratio: 4
Triglycerides: 737 mg/dL — ABNORMAL HIGH (ref 0.0–149.0)

## 2015-05-29 LAB — BASIC METABOLIC PANEL
BUN: 23 mg/dL (ref 6–23)
CO2: 26 mEq/L (ref 19–32)
Calcium: 10.1 mg/dL (ref 8.4–10.5)
Chloride: 105 mEq/L (ref 96–112)
Creatinine, Ser: 1.75 mg/dL — ABNORMAL HIGH (ref 0.40–1.50)
GFR: 42.29 mL/min — ABNORMAL LOW (ref >60.00)
Glucose, Bld: 219 mg/dL — ABNORMAL HIGH (ref 70–99)
Potassium: 4.7 mEq/L (ref 3.5–5.1)
Sodium: 139 mEq/L (ref 135–145)

## 2015-05-29 LAB — HEPATIC FUNCTION PANEL
ALT: 31 U/L (ref 0–53)
AST: 21 U/L (ref 0–37)
Albumin: 4.8 g/dL (ref 3.5–5.2)
Alkaline Phosphatase: 48 U/L (ref 39–117)
Bilirubin, Direct: 0 mg/dL (ref 0.0–0.3)
Total Bilirubin: 0.3 mg/dL (ref 0.2–1.2)
Total Protein: 7.2 g/dL (ref 6.0–8.3)

## 2015-05-29 LAB — HEMOGLOBIN A1C: Hgb A1c MFr Bld: 7.6 % — ABNORMAL HIGH (ref 4.6–6.5)

## 2015-05-29 LAB — TSH: TSH: 2.08 u[IU]/mL (ref 0.35–4.50)

## 2015-05-29 LAB — PSA: PSA: 0.26 ng/mL (ref 0.10–4.00)

## 2015-05-29 LAB — MICROALBUMIN / CREATININE URINE RATIO
Creatinine,U: 446.6 mg/dL
Microalb Creat Ratio: 0.8 mg/g (ref 0.0–30.0)
Microalb, Ur: 3.6 mg/dL — ABNORMAL HIGH (ref 0.0–1.9)

## 2015-05-29 LAB — LDL CHOLESTEROL, DIRECT: Direct LDL: 62 mg/dL

## 2015-05-29 NOTE — Progress Notes (Signed)
Pre visit review using our clinic review tool, if applicable. No additional management support is needed unless otherwise documented below in the visit note. 

## 2015-05-29 NOTE — Patient Instructions (Signed)
Please continue all other medications as before, and refills have been done if requested.  Please have the pharmacy call with any other refills you may need.  Please continue your efforts at being more active, low cholesterol diet, and weight control.  You are otherwise up to date with prevention measures today.  Please keep your appointments with your specialists as you may have planned  Please call if you change your mind about referral to orthopedic for the right shoulder  Please go to the LAB in the Basement (turn left off the elevator) for the tests to be done today  You will be contacted by phone if any changes need to be made immediately.  Otherwise, you will receive a letter about your results with an explanation, but please check with MyChart first.  Please remember to sign up for MyChart if you have not done so, as this will be important to you in the future with finding out test results, communicating by private email, and scheduling acute appointments online when needed.  Please return in 6 months, or sooner if needed, with Lab testing done 3-5 days before

## 2015-05-29 NOTE — Progress Notes (Signed)
Subjective:    Patient ID: [name] [name], male    DOB: 06/23/1955, 60 y.o.   MRN: [id number]  HPI  Here for wellness and f/u;  Overall doing ok;  Pt denies Chest pain, worsening SOB, DOE, wheezing, orthopnea, PND, worsening Mattern edema, palpitations, dizziness or syncope.  Pt denies neurological change such as new headache, facial or extremity weakness.  Pt denies polydipsia, polyuria, or low sugar symptoms. Pt states overall good compliance with treatment and medications, good tolerability, and has been trying to follow appropriate diet.  Pt denies worsening depressive symptoms, suicidal ideation or panic. No fever, night sweats, wt loss, loss of appetite, or other constitutional symptoms.  Pt states good ability with ADL's, has low fall risk, home safety reviewed and adequate, no other significant changes in hearing or vision, and only occasionally active with exercise. Declines tetanus today/  Does also have persistent right shoulder pain for several months, mild to mod, worse to lie on right side and abduct and forward elevated, without neck or radicular pain Past Medical History  Diagnosis Date  . CAD (coronary artery disease)     The patient presented in Aug 2008 with acute cornary syndrome. He did have a bypass surgeru. He did have a 3-vessel disease on heart cathiterization and had bypass surgery with LIMA to the LAD, sequential vein graft to the obtuse marginal - 1 and obtuse marginal -2, and vein graft to the RCA. Adenosine myoview (11/10): EF 64%, normal wall motion, normal perfusion.   [name]. History of echocardiogram     November 2008. EF 65-70%. no regional wall abnormalities. Trivial mitral regurgitation.  [name]. GERD (gastroesophageal reflux disease)   . HTN (hypertension)   . Hyperlipidemia   . Sick sinus syndrome (HCC)     with presyncope. medtronic dual chamber PCM placed 11/10  . Type II or unspecified type diabetes mellitus without mention of complication, not stated as uncontrolled   . Mild  anemia     Hx of post op. 8/08  . Paroxysmal atrial fibrillation (HCC)     only episode noted breifly during hospitalization for PCM placement 11/10.    Past Surgical History  Procedure Laterality Date  . Coronary artery bypass graft    . Rectal abcess      2005. Dr. Carolynne Edouardoth with interanal sphincterotomy  . Pacemaker insertion      MDT implanted 2010    reports that he quit smoking about 9 years ago. His smoking use included Cigarettes. He has a 20 pack-year smoking history. He has never used smokeless tobacco. He reports that he drinks about 1.2 oz of alcohol per week. He reports that he does not use illicit drugs. Family history is unknown by patient. No Known Allergies Current Outpatient Prescriptions on File Prior to Visit  Medication Sig Dispense Refill  . amLODipine (NORVASC) 5 MG tablet TAKE 1 TABLET BY MOUTH DAILY 90 tablet 1  . aspirin 81 MG tablet Take 1 tablet (81 mg total) by mouth daily.    [name]. atorvastatin (LIPITOR) 40 MG tablet TAKE 1 TABLET BY MOUTH DAILY AT 6 PM 90 tablet 1  . lisinopril (PRINIVIL,ZESTRIL) 40 MG tablet TAKE 1 TABLET BY MOUTH EVERY DAY 90 tablet 1  . meloxicam (MOBIC) 15 MG tablet Take 1 tablet (15 mg total) by mouth daily. Yearly physical w/labs is due must see MD for refills 30 tablet 0  . metoprolol (LOPRESSOR) 50 MG tablet Take 1 tablet (50 mg total) by mouth 2 (two) times daily.  Yearly physical is due mujst see MD for refills 60 tablet 0  . Omega-3 Fatty Acids (OMEGA-3 FISH OIL) 1200 MG CAPS Take 1 capsule by mouth 2 (two) times daily.     [name] omeprazole (PRILOSEC) 20 MG capsule TAKE 1 CAPSULE BY MOUTH TWICE DAILY 180 capsule 0  . traMADol (ULTRAM) 50 MG tablet Take 50 mg by mouth at bedtime as needed (pain).     No current facility-administered medications on file prior to visit.    Review of Systems Constitutional: Negative for increased diaphoresis, or other activity, appetite or siginficant weight change other than noted HENT: Negative for worsening  hearing loss, ear pain, facial swelling, mouth sores and neck stiffness.   Eyes: Negative for other worsening pain, redness or visual disturbance.  Respiratory: Negative for choking or stridor Cardiovascular: Negative for other chest pain and palpitations.  Gastrointestinal: Negative for worsening diarrhea, blood in stool, or abdominal distention Genitourinary: Negative for hematuria, flank pain or change in urine volume.  Musculoskeletal: Negative for myalgias or other joint complaints.  Skin: Negative for other color change and wound or drainage.  Neurological: Negative for syncope and numbness. other than noted Hematological: Negative for adenopathy. or other swelling Psychiatric/Behavioral: Negative for hallucinations, SI, self-injury, decreased concentration or other worsening agitation.      Objective:   Physical Exam BP 118/70 mmHg  Pulse 77  Temp(Src) 98.4 F (36.9 C) (Oral)  Resp 20  Wt 165 lb (74.844 kg)  SpO2 95% VS noted,  Constitutional: Pt is oriented to person, place, and time. Appears well-developed and well-nourished, in no significant distress Head: Normocephalic and atraumatic  Eyes: Conjunctivae and EOM are normal. Pupils are equal, round, and reactive to light Right Ear: External ear normal.  Left Ear: External ear normal Nose: Nose normal.  Mouth/Throat: Oropharynx is clear and moist  Neck: Normal range of motion. Neck supple. No JVD present. No tracheal deviation present or significant neck LA or mass Cardiovascular: Normal rate, regular rhythm, normal heart sounds and intact distal pulses.   Pulmonary/Chest: Effort normal and breath sounds without rales or wheezing  Abdominal: Soft. Bowel sounds are normal. NT. No HSM  Musculoskeletal: Normal range of motion. Exhibits no edema Lymphadenopathy: Has no cervical adenopathy.  Neurological: Pt is alert and oriented to person, place, and time. Pt has normal reflexes. No cranial nerve deficit. Motor grossly  intact Skin: Skin is warm and dry. No rash noted or new ulcers Psychiatric:  Has normal mood and affect. Behavior is normal.  Right shoulder tender subacromial, without swelling or rash or other skin change       Assessment & Plan:

## 2015-05-30 ENCOUNTER — Other Ambulatory Visit: Payer: Self-pay | Admitting: Internal Medicine

## 2015-05-30 MED ORDER — METFORMIN HCL ER 500 MG PO TB24: ORAL_TABLET | ORAL | Status: DC

## 2015-05-30 MED ORDER — FENOFIBRATE 145 MG PO TABS: 145.0000 mg | ORAL_TABLET | Freq: Every day | ORAL | Status: DC

## 2015-06-01 NOTE — Assessment & Plan Note (Signed)
stable overall by history and exam, recent data reviewed with pt, and pt to continue medical treatment as before,  to f/u any worsening symptoms or concerns Lab Results  Component Value Date   HGBA1C 7.6* 05/29/2015

## 2015-06-01 NOTE — Assessment & Plan Note (Signed)
stable overall by history and exam, and pt to continue medical treatment as before,  to f/u any worsening symptoms or concerns 

## 2015-06-01 NOTE — Assessment & Plan Note (Signed)
stable overall by history and exam, recent data reviewed with pt, and pt to continue medical treatment as before,  to f/u any worsening symptoms or concerns Lab Results  Component Value Date   LDLCALC 60 03/12/2015

## 2015-06-01 NOTE — Assessment & Plan Note (Signed)

## 2015-06-01 NOTE — Assessment & Plan Note (Addendum)
At least mild persistent, to re-start nsaid, d/w pt referral to sport med in f/u or ortho but declines at this time  In addition to the time spent performing CPE, I spent an additional 40 minutes face to face,in which greater than 50% of this time was spent in counseling and coordination of care for patient's acute illness as documented.

## 2015-06-19 ENCOUNTER — Other Ambulatory Visit: Payer: Self-pay | Admitting: Internal Medicine

## 2015-06-25 ENCOUNTER — Ambulatory Visit (INDEPENDENT_AMBULATORY_CARE_PROVIDER_SITE_OTHER): Payer: BLUE CROSS/BLUE SHIELD | Admitting: *Deleted

## 2015-06-25 ENCOUNTER — Telehealth: Payer: Self-pay | Admitting: Cardiology

## 2015-06-25 DIAGNOSIS — I495 Sick sinus syndrome: Secondary | ICD-10-CM

## 2015-06-25 NOTE — Telephone Encounter (Signed)
Spoke with pt and reminded pt of remote transmission that is due today. Pt verbalized understanding.   

## 2015-06-26 NOTE — Progress Notes (Signed)
Remote pacemaker transmission.   

## 2015-07-01 LAB — CUP PACEART REMOTE DEVICE CHECK
Battery Impedance: 398 Ohm
Battery Remaining Longevity: 101 mo
Battery Voltage: 2.79 V
Brady Statistic AP VP Percent: 0 %
Brady Statistic AP VS Percent: 75 %
Brady Statistic AS VP Percent: 0 %
Brady Statistic AS VS Percent: 25 %
Date Time Interrogation Session: 20170614181654
Implantable Lead Implant Date: 20101101
Implantable Lead Implant Date: 20101101
Implantable Lead Location: 753859
Implantable Lead Location: 753860
Implantable Lead Model: 5076
Implantable Lead Model: 5076
Lead Channel Impedance Value: 569 Ohm
Lead Channel Impedance Value: 582 Ohm
Lead Channel Pacing Threshold Amplitude: 0.5 V
Lead Channel Pacing Threshold Amplitude: 0.75 V
Lead Channel Pacing Threshold Pulse Width: 0.4 ms
Lead Channel Pacing Threshold Pulse Width: 0.4 ms
Lead Channel Sensing Intrinsic Amplitude: 1 mV
Lead Channel Sensing Intrinsic Amplitude: 16 mV
Lead Channel Setting Pacing Amplitude: 2 V
Lead Channel Setting Pacing Amplitude: 2.5 V
Lead Channel Setting Pacing Pulse Width: 0.4 ms
Lead Channel Setting Sensing Sensitivity: 5.6 mV

## 2015-07-02 ENCOUNTER — Encounter: Payer: Self-pay | Admitting: Cardiology

## 2015-07-06 ENCOUNTER — Other Ambulatory Visit: Payer: Self-pay | Admitting: Internal Medicine

## 2015-07-10 ENCOUNTER — Other Ambulatory Visit: Payer: Self-pay | Admitting: Internal Medicine

## 2015-07-17 ENCOUNTER — Encounter: Payer: Self-pay | Admitting: Cardiology

## 2015-07-19 ENCOUNTER — Other Ambulatory Visit: Payer: Self-pay | Admitting: Internal Medicine

## 2015-08-19 ENCOUNTER — Other Ambulatory Visit: Payer: Self-pay | Admitting: Internal Medicine

## 2015-09-25 ENCOUNTER — Telehealth: Payer: Self-pay | Admitting: Cardiology

## 2015-09-25 ENCOUNTER — Ambulatory Visit (INDEPENDENT_AMBULATORY_CARE_PROVIDER_SITE_OTHER): Payer: BLUE CROSS/BLUE SHIELD | Admitting: *Deleted

## 2015-09-25 DIAGNOSIS — I495 Sick sinus syndrome: Secondary | ICD-10-CM

## 2015-09-25 NOTE — Telephone Encounter (Signed)
Spoke with pt and reminded pt of remote transmission that is due today. Pt verbalized understanding.   

## 2015-09-26 ENCOUNTER — Encounter: Payer: Self-pay | Admitting: Cardiology

## 2015-09-26 NOTE — Progress Notes (Signed)
Remote pacemaker transmission.   

## 2015-10-03 ENCOUNTER — Other Ambulatory Visit: Payer: Self-pay | Admitting: Internal Medicine

## 2015-10-07 ENCOUNTER — Other Ambulatory Visit: Payer: Self-pay | Admitting: Internal Medicine

## 2015-10-08 LAB — CUP PACEART REMOTE DEVICE CHECK
Battery Impedance: 398 Ohm
Battery Remaining Longevity: 99 mo
Battery Voltage: 2.79 V
Brady Statistic AP VP Percent: 0 %
Brady Statistic AP VS Percent: 76 %
Brady Statistic AS VP Percent: 0 %
Brady Statistic AS VS Percent: 24 %
Date Time Interrogation Session: 20170914202208
Implantable Lead Implant Date: 20101101
Implantable Lead Implant Date: 20101101
Implantable Lead Location: 753859
Implantable Lead Location: 753860
Implantable Lead Model: 5076
Implantable Lead Model: 5076
Lead Channel Impedance Value: 521 Ohm
Lead Channel Impedance Value: 532 Ohm
Lead Channel Pacing Threshold Amplitude: 0.625 V
Lead Channel Pacing Threshold Amplitude: 0.875 V
Lead Channel Pacing Threshold Pulse Width: 0.4 ms
Lead Channel Pacing Threshold Pulse Width: 0.4 ms
Lead Channel Sensing Intrinsic Amplitude: 1 mV
Lead Channel Sensing Intrinsic Amplitude: 8 mV
Lead Channel Setting Pacing Amplitude: 2 V
Lead Channel Setting Pacing Amplitude: 2.5 V
Lead Channel Setting Pacing Pulse Width: 0.4 ms
Lead Channel Setting Sensing Sensitivity: 5.6 mV

## 2015-10-10 ENCOUNTER — Encounter: Payer: Self-pay | Admitting: Cardiology

## 2015-10-15 ENCOUNTER — Other Ambulatory Visit: Payer: Self-pay | Admitting: Internal Medicine

## 2015-11-06 ENCOUNTER — Encounter: Payer: Self-pay | Admitting: Nurse Practitioner

## 2015-11-20 ENCOUNTER — Encounter: Payer: Self-pay | Admitting: Nurse Practitioner

## 2015-11-20 ENCOUNTER — Ambulatory Visit (INDEPENDENT_AMBULATORY_CARE_PROVIDER_SITE_OTHER): Payer: BLUE CROSS/BLUE SHIELD | Admitting: Nurse Practitioner

## 2015-11-20 VITALS — BP 110/70 | HR 70 | Ht 64.0 in | Wt 165.1 lb

## 2015-11-20 DIAGNOSIS — I495 Sick sinus syndrome: Secondary | ICD-10-CM | POA: Diagnosis not present

## 2015-11-20 DIAGNOSIS — I10 Essential (primary) hypertension: Secondary | ICD-10-CM | POA: Diagnosis not present

## 2015-11-20 NOTE — Patient Instructions (Addendum)
Medication Instructions:   Your physician recommends that you continue on your current medications as directed. Please refer to the Current Medication list given to you today.   If you need a refill on your cardiac medications before your next appointment, please call your pharmacy.  Labwork:NONE ORDERED  TODAY    Testing/Procedures: NONE ORDERED  TODAY    Follow-Up:  DR Crist[facility]HRISTOPHER END  [facility]   Your physician wants you to follow-up in: ONE YEAR WITH ALLRED  You will receive a reminder letter in the mail two months in advance. If you don't receive a letter, please call our office to schedule the follow-up appointment.   Remote monitoring is used to monitor your Pacemaker of ICD from home. This monitoring reduces the number of office visits required to check your device to one time per year. It allows us to keep an eye on the functioning of your device to ensure it is working properly. You are scheduled for a device check from home on .[name].[name].02/19/15..You may send your transmission at any time that day. If you have a wireless device, the transmission will be sent automatically. After your physician reviews your transmission, you will receive a postcard with your next transmission date.       Any Other Special Instructions Will Be Listed Below (If Applicable).

## 2015-11-20 NOTE — Progress Notes (Signed)
Electrophysiology Office Note Date: 11/21/2015  ID:  [name] [name], DOB 01/27/1955, MRN [id number]  PCP: Oliver BarreJames John, MD Primary Cardiologist: Shirlee LatchMcLean Electrophysiologist: Allred  CC: Pacemaker follow-up  [name] [name] is a 60 y.o. male seen today for Dr Johney FrameAllred.  He presents today for routine electrophysiology followup.  Since last being seen in our clinic, the patient reports doing very well.  He denies chest pain, palpitations, dyspnea, PND, orthopnea, nausea, vomiting, dizziness, syncope, edema, weight gain, or early satiety.  Device History: MDT dual chamber PPM implanted 2010 for SSS   Past Medical History:  Diagnosis Date  . CAD (coronary artery disease)    The patient presented in Aug 2008 with acute cornary syndrome. He did have a bypass surgeru. He did have a 3-vessel disease on heart cathiterization and had bypass surgery with LIMA to the LAD, sequential vein graft to the obtuse marginal - 1 and obtuse marginal -2, and vein graft to the RCA. Adenosine myoview (11/10): EF 64%, normal wall motion, normal perfusion.   [name]. GERD (gastroesophageal reflux disease)   . History of echocardiogram    November 2008. EF 65-70%. no regional wall abnormalities. Trivial mitral regurgitation.  [name]. HTN (hypertension)   . Hyperlipidemia   . Mild anemia    Hx of post op. 8/08  . Paroxysmal atrial fibrillation (HCC)    only episode noted breifly during hospitalization for PCM placement 11/10.   . Sick sinus syndrome (HCC)    with presyncope. medtronic dual chamber PCM placed 11/10  . Type II or unspecified type diabetes mellitus without mention of complication, not stated as uncontrolled    Past Surgical History:  Procedure Laterality Date  . CORONARY ARTERY BYPASS GRAFT    . PACEMAKER INSERTION     MDT implanted 2010  . rectal abcess     2005. Dr. Carolynne Edouardoth with interanal sphincterotomy    Current Outpatient Prescriptions  Medication Sig Dispense Refill  . amLODipine (NORVASC) 5 MG tablet TAKE 1  TABLET BY MOUTH DAILY 90 tablet 0  . aspirin 81 MG tablet Take 1 tablet (81 mg total) by mouth daily.    [name]. atorvastatin (LIPITOR) 40 MG tablet TAKE 1 TABLET BY MOUTH DAILY AT 6 PM 90 tablet 0  . fenofibrate (TRICOR) 145 MG tablet Take 1 tablet (145 mg total) by mouth daily. 90 tablet 3  . lisinopril (PRINIVIL,ZESTRIL) 40 MG tablet TAKE 1 TABLET BY MOUTH EVERY DAY 90 tablet 0  . meloxicam (MOBIC) 15 MG tablet TAKE 1 TABLET BY MOUTH DAILY 90 tablet 1  . metFORMIN (GLUCOPHAGE-XR) 500 MG 24 hr tablet TAKE 2 TABLET(500 MG) BY MOUTH DAILY WITH BREAKFAST 180 tablet 3  . metoprolol (LOPRESSOR) 50 MG tablet Take 1 tablet (50 mg total) by mouth 2 (two) times daily. 180 tablet 1  . Omega-3 Fatty Acids (OMEGA-3 FISH OIL) 1200 MG CAPS Take 1 capsule by mouth 2 (two) times daily.     [name]. omeprazole (PRILOSEC) 20 MG capsule TAKE 1 CAPSULE BY MOUTH TWICE DAILY 180 capsule 0  . traMADol (ULTRAM) 50 MG tablet Take 50 mg by mouth at bedtime as needed (pain).     No current facility-administered medications for this visit.     Allergies:   Patient has no known allergies.   Social History: Social History   Social History  . Marital status: Married    Spouse name: N/A  . Number of children: N/A  . Years of education: N/A   Occupational History  .  Not on file.   Social History Main Topics  . Smoking status: Former Smoker    Packs/day: 1.00    Years: 20.00    Types: Cigarettes    Quit date: 01/11/2006  . Smokeless tobacco: Never Used  . Alcohol use 1.2 oz/week    2 Cans of beer per week     Comment: social on weekends  . Drug use: No  . Sexual activity: Not on file   Other Topics Concern  . Not on file   Social History Narrative   Married, 3 children. To [place] from [place] 1979. 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 Family history unknown: Yes     Review of Systems: All other systems reviewed and are otherwise negative  except as noted above.   Physical Exam: VS:  BP 110/70   Pulse 70   Ht 5\' 4"  (1.626 m)   Wt 165 lb 2 oz (74.9 kg)   BMI 28.34 kg/m  , BMI Body mass index is 28.34 kg/m.  GEN- The patient is well appearing, alert and oriented x 3 today.   HEENT: normocephalic, atraumatic; sclera clear, conjunctiva pink; hearing intact; oropharynx clear; neck supple Lungs- Clear to ausculation bilaterally, normal work of breathing.  No wheezes, rales, rhonchi Heart- Regular rate and rhythm GI- soft, non-tender, non-distended, bowel sounds present, no hepatosplenomegaly Extremities- no clubbing, cyanosis, or edema MS- no significant deformity or atrophy Skin- warm and dry, no rash or lesion; PPM pocket well healed Psych- euthymic mood, full affect Neuro- strength and sensation are intact  PPM Interrogation- reviewed in detail today,  See PACEART report  EKG:  EKG is not ordered today.  Recent Labs: 05/29/2015: ALT 31; BUN 23; Creatinine, Ser 1.75; Hemoglobin 13.8; Platelets 260.0; Potassium 4.7; Sodium 139; TSH 2.08   Wt Readings from Last 3 Encounters:  11/20/15 165 lb 2 oz (74.9 kg)  05/29/15 165 lb (74.8 kg)  03/07/15 164 lb (74.4 kg)     Other studies Reviewed: Additional studies/ records that were reviewed today include: Dr Johney FrameAllred and Dr Alford HighlandMcLean's office notes  Assessment and Plan:  1.  Sick sinus syndrome Normal PPM function See Pace Art report No changes today  2.  CAD No recent ischemic symptoms  3.  Hypertension Stable No change required today   Current medicines are reviewed at length with the patient today.   The patient does not have concerns regarding his medicines.  The following changes were made today:  none  Labs/ tests ordered today include: none No orders of the defined types were placed in this encounter.    Disposition:   Follow up with [facility], Dr Johney FrameAllred 1 year.  Will need to establish with general cardiology as Dr Shir[facility]McLean [facility] Will refer to Dr End for follow up in 6 months      Signed, Gypsy BalsamAmber Seiler, NP 11/21/2015 10:06 AM  [facility] [address] [place] [address] [phone] (office) [phone] (fax)

## 2015-12-01 LAB — CUP PACEART INCLINIC DEVICE CHECK
Date Time Interrogation Session: 20171120121340
Implantable Lead Implant Date: 20101101
Implantable Lead Implant Date: 20101101
Implantable Lead Location: 753859
Implantable Lead Location: 753860
Implantable Lead Model: 5076
Implantable Lead Model: 5076
Implantable Pulse Generator Implant Date: 20101101

## 2015-12-02 ENCOUNTER — Ambulatory Visit (INDEPENDENT_AMBULATORY_CARE_PROVIDER_SITE_OTHER): Payer: BLUE CROSS/BLUE SHIELD | Admitting: Internal Medicine

## 2015-12-02 ENCOUNTER — Other Ambulatory Visit: Payer: Self-pay | Admitting: Internal Medicine

## 2015-12-02 ENCOUNTER — Other Ambulatory Visit (INDEPENDENT_AMBULATORY_CARE_PROVIDER_SITE_OTHER): Payer: BLUE CROSS/BLUE SHIELD

## 2015-12-02 ENCOUNTER — Encounter: Payer: Self-pay | Admitting: Internal Medicine

## 2015-12-02 VITALS — BP 130/70 | HR 74 | Resp 20 | Wt 164.0 lb

## 2015-12-02 DIAGNOSIS — J309 Allergic rhinitis, unspecified: Secondary | ICD-10-CM

## 2015-12-02 DIAGNOSIS — M7550 Bursitis of unspecified shoulder: Secondary | ICD-10-CM | POA: Diagnosis not present

## 2015-12-02 DIAGNOSIS — I119 Hypertensive heart disease without heart failure: Secondary | ICD-10-CM

## 2015-12-02 DIAGNOSIS — Z0001 Encounter for general adult medical examination with abnormal findings: Secondary | ICD-10-CM

## 2015-12-02 DIAGNOSIS — E119 Type 2 diabetes mellitus without complications: Secondary | ICD-10-CM

## 2015-12-02 DIAGNOSIS — E785 Hyperlipidemia, unspecified: Secondary | ICD-10-CM

## 2015-12-02 LAB — BASIC METABOLIC PANEL
BUN: 24 mg/dL — ABNORMAL HIGH (ref 6–23)
CO2: 30 mEq/L (ref 19–32)
Calcium: 10.2 mg/dL (ref 8.4–10.5)
Chloride: 103 mEq/L (ref 96–112)
Creatinine, Ser: 1.25 mg/dL (ref 0.40–1.50)
GFR: 62.24 mL/min (ref >60.00)
Glucose, Bld: 127 mg/dL — ABNORMAL HIGH (ref 70–99)
Potassium: 5.2 mEq/L — ABNORMAL HIGH (ref 3.5–5.1)
Sodium: 139 mEq/L (ref 135–145)

## 2015-12-02 LAB — HEPATIC FUNCTION PANEL
ALT: 18 U/L (ref 0–53)
AST: 17 U/L (ref 0–37)
Albumin: 4.8 g/dL (ref 3.5–5.2)
Alkaline Phosphatase: 34 U/L — ABNORMAL LOW (ref 39–117)
Bilirubin, Direct: 0.1 mg/dL (ref 0.0–0.3)
Total Bilirubin: 0.4 mg/dL (ref 0.2–1.2)
Total Protein: 7.8 g/dL (ref 6.0–8.3)

## 2015-12-02 LAB — HEMOGLOBIN A1C: Hgb A1c MFr Bld: 7.8 % — ABNORMAL HIGH (ref 4.6–6.5)

## 2015-12-02 LAB — LIPID PANEL
Cholesterol: 176 mg/dL (ref 0–200)
HDL: 63.3 mg/dL (ref >39.00)
LDL Cholesterol: 77 mg/dL (ref 0–99)
NonHDL: 112.5
Total CHOL/HDL Ratio: 3
Triglycerides: 178 mg/dL — ABNORMAL HIGH (ref 0.0–149.0)
VLDL: 35.6 mg/dL (ref 0.0–40.0)

## 2015-12-02 MED ORDER — TRAMADOL HCL 50 MG PO TABS: 50.0000 mg | ORAL_TABLET | Freq: Every evening | ORAL | 2 refills | Status: DC | PRN

## 2015-12-02 MED ORDER — METFORMIN HCL ER 500 MG PO TB24: ORAL_TABLET | ORAL | 3 refills | Status: DC

## 2015-12-02 NOTE — Progress Notes (Signed)
Pre visit review using our clinic review tool, if applicable. No additional management support is needed unless otherwise documented below in the visit note. 

## 2015-12-02 NOTE — Patient Instructions (Signed)
Please take all new medication as prescribed - the tramadol at night for pain  Please call if you change your mind about seeing Dr Katrinka BlazingSmith for cortisone shot  You can also try Zyrtec 10 mg OTC daily as needed for allergies  Please continue all other medications as before  Please have the pharmacy call with any other refills you may need.  Please continue your efforts at being more active, low cholesterol diabetic diet, and weight control.  Please keep your appointments with your specialists as you may have planned  Please go to the LAB in the Basement (turn left off the elevator) for the tests to be done today  You will be contacted by phone if any changes need to be made immediately.  Otherwise, you will receive a letter about your results with an explanation, but please check with MyChart first.  Please remember to sign up for MyChart if you have not done so, as this will be important to you in the future with finding out test results, communicating by private email, and scheduling acute appointments online when needed.  Please return in 6 months, or sooner if needed, with Lab testing done 3-5 days before

## 2015-12-02 NOTE — Progress Notes (Signed)
Subjective:    Patient ID: [name] [name], male    DOB: 11/24/1955, 60 y.o.   MRN: [id number]  HPI  Here with acute onset right shoulder pain, mild to mod, sharp, intermittent, worse to move the arm, aggrevated by his work, rest tends to help.  Has hx of similar problem last yr, tx for bursitis per sport med with improvement, but not quite as severe this time, declines referral back. Worst pain is at night to lie on it.  Pt denies chest pain, increased sob or doe, wheezing, orthopnea, PND, increased Vialpando swelling, palpitations, dizziness or syncope.  Pt denies new neurological symptoms such as new headache, or facial or extremity weakness or numbness  Does have several wks ongoing mild nasal allergy symptoms with clearish congestion, itch and sneezing, without fever, pain, ST, cough, swelling or wheezing. No other new history findings Past Medical History:  Diagnosis Date  . CAD (coronary artery disease)    The patient presented in Aug 2008 with acute cornary syndrome. He did have a bypass surgeru. He did have a 3-vessel disease on heart cathiterization and had bypass surgery with LIMA to the LAD, sequential vein graft to the obtuse marginal - 1 and obtuse marginal -2, and vein graft to the RCA. Adenosine myoview (11/10): EF 64%, normal wall motion, normal perfusion.   [name]. GERD (gastroesophageal reflux disease)   . History of echocardiogram    November 2008. EF 65-70%. no regional wall abnormalities. Trivial mitral regurgitation.  [name]. HTN (hypertension)   . Hyperlipidemia   . Mild anemia    Hx of post op. 8/08  . Paroxysmal atrial fibrillation (HCC)    only episode noted breifly during hospitalization for PCM placement 11/10.   . Sick sinus syndrome (HCC)    with presyncope. medtronic dual chamber PCM placed 11/10  . Type II or unspecified type diabetes mellitus without mention of complication, not stated as uncontrolled    Past Surgical History:  Procedure Laterality Date  . CORONARY ARTERY BYPASS  GRAFT    . PACEMAKER INSERTION     MDT implanted 2010  . rectal abcess     2005. Dr. Carolynne Edouardoth with interanal sphincterotomy    reports that he quit smoking about 9 years ago. His smoking use included Cigarettes. He has a 20.00 pack-year smoking history. He has never used smokeless tobacco. He reports that he drinks about 1.2 oz of alcohol per week . He reports that he does not use drugs. Family history is unknown by patient. No Known Allergies Current Outpatient Prescriptions on File Prior to Visit  Medication Sig Dispense Refill  . amLODipine (NORVASC) 5 MG tablet TAKE 1 TABLET BY MOUTH DAILY 90 tablet 0  . aspirin 81 MG tablet Take 1 tablet (81 mg total) by mouth daily.    [name]. atorvastatin (LIPITOR) 40 MG tablet TAKE 1 TABLET BY MOUTH DAILY AT 6 PM 90 tablet 0  . fenofibrate (TRICOR) 145 MG tablet Take 1 tablet (145 mg total) by mouth daily. 90 tablet 3  . lisinopril (PRINIVIL,ZESTRIL) 40 MG tablet TAKE 1 TABLET BY MOUTH EVERY DAY 90 tablet 0  . meloxicam (MOBIC) 15 MG tablet TAKE 1 TABLET BY MOUTH DAILY 90 tablet 1  . metoprolol (LOPRESSOR) 50 MG tablet Take 1 tablet (50 mg total) by mouth 2 (two) times daily. 180 tablet 1  . Omega-3 Fatty Acids (OMEGA-3 FISH OIL) 1200 MG CAPS Take 1 capsule by mouth 2 (two) times daily.     [name]. omeprazole (PRILOSEC)  20 MG capsule TAKE 1 CAPSULE BY MOUTH TWICE DAILY 180 capsule 0   No current facility-administered medications on file prior to visit.    Review of Systems  Constitutional: Negative for unusual diaphoresis or night sweats HENT: Negative for ear swelling or discharge Eyes: Negative for worsening visual haziness  Respiratory: Negative for choking and stridor.   Gastrointestinal: Negative for distension or worsening eructation Genitourinary: Negative for retention or change in urine volume.  Musculoskeletal: Negative for other MSK pain or swelling Skin: Negative for color change and worsening wound Neurological: Negative for tremors and numbness  other than noted  Psychiatric/Behavioral: Negative for decreased concentration or agitation other than above   All other system neg per pt    Objective:   Physical Exam BP 130/70   Pulse 74   Resp 20   Wt 164 lb (74.4 kg)   SpO2 98%   BMI 28.15 kg/m  VS noted, not ill appearing Constitutional: Pt appears in no apparent distress HENT: Head: NCAT.  Right Ear: External ear normal.  Left Ear: External ear normal.  Eyes: . Pupils are equal, round, and reactive to light. Conjunctivae and EOM are normal Bilat tm's with mild erythema.  Max sinus areas non tender.  Pharynx with mild erythema, no exudate Neck: Normal range of motion. Neck supple.  Cardiovascular: Normal rate and regular rhythm.   Pulmonary/Chest: Effort normal and breath sounds without rales or wheezing.  Right shoulder with diffuse mild tenderness without swelling, worse pain on active ROM but full ROM to forward elevation and abduction Neurological: Pt is alert. Not confused , motor grossly intact Skin: Skin is warm. No rash, no Opdahl edema Psychiatric: Pt behavior is normal. No agitation.  No other new exam findings    Assessment & Plan:

## 2015-12-06 DIAGNOSIS — J309 Allergic rhinitis, unspecified: Secondary | ICD-10-CM | POA: Insufficient documentation

## 2015-12-06 NOTE — Assessment & Plan Note (Signed)
Mild, for zyrtec prn OTC,  to f/u any worsening symptoms or concerns

## 2015-12-06 NOTE — Assessment & Plan Note (Signed)
Lab Results  Component Value Date   LDLCALC 77 12/02/2015   stable overall by history and exam, recent data reviewed with pt, and pt to continue medical treatment as before,  to f/u any worsening symptoms or concerns

## 2015-12-06 NOTE — Assessment & Plan Note (Signed)
stable overall by history and exam, recent data reviewed with pt, and pt to continue medical treatment as before,  to f/u any worsening symptoms or concerns BP Readings from Last 3 Encounters:  12/02/15 130/70  11/20/15 110/70  05/29/15 118/70

## 2015-12-06 NOTE — Assessment & Plan Note (Signed)
Mild, for nsaid prn, declines f/u with sport med for now unless worsens

## 2015-12-06 NOTE — Assessment & Plan Note (Signed)
stable overall by history and exam, recent data reviewed with pt, and pt to continue medical treatment as before,  to f/u any worsening symptoms or concerns Lab Results  Component Value Date   HGBA1C 7.8 (H) 12/02/2015

## 2015-12-14 ENCOUNTER — Other Ambulatory Visit: Payer: Self-pay | Admitting: Internal Medicine

## 2015-12-15 ENCOUNTER — Other Ambulatory Visit: Payer: Self-pay | Admitting: Internal Medicine

## 2016-01-03 ENCOUNTER — Other Ambulatory Visit: Payer: Self-pay | Admitting: Internal Medicine

## 2016-01-13 ENCOUNTER — Other Ambulatory Visit: Payer: Self-pay | Admitting: Internal Medicine

## 2016-02-19 ENCOUNTER — Ambulatory Visit (INDEPENDENT_AMBULATORY_CARE_PROVIDER_SITE_OTHER): Payer: BLUE CROSS/BLUE SHIELD | Admitting: *Deleted

## 2016-02-19 ENCOUNTER — Telehealth: Payer: Self-pay | Admitting: Cardiology

## 2016-02-19 DIAGNOSIS — I495 Sick sinus syndrome: Secondary | ICD-10-CM | POA: Diagnosis not present

## 2016-02-19 NOTE — Telephone Encounter (Signed)
Spoke with pt and reminded pt of remote transmission that is due today. Pt verbalized understanding.   

## 2016-02-20 ENCOUNTER — Encounter: Payer: Self-pay | Admitting: Cardiology

## 2016-02-20 LAB — CUP PACEART REMOTE DEVICE CHECK
Battery Impedance: 497 Ohm
Battery Remaining Longevity: 91 mo
Battery Voltage: 2.78 V
Brady Statistic AP VP Percent: 0 %
Brady Statistic AP VS Percent: 80 %
Brady Statistic AS VP Percent: 0 %
Brady Statistic AS VS Percent: 20 %
Date Time Interrogation Session: 20180208221105
Implantable Lead Implant Date: 20101101
Implantable Lead Implant Date: 20101101
Implantable Lead Location: 753859
Implantable Lead Location: 753860
Implantable Lead Model: 5076
Implantable Lead Model: 5076
Implantable Pulse Generator Implant Date: 20101101
Lead Channel Impedance Value: 555 Ohm
Lead Channel Impedance Value: 564 Ohm
Lead Channel Pacing Threshold Amplitude: 0.5 V
Lead Channel Pacing Threshold Amplitude: 0.75 V
Lead Channel Pacing Threshold Pulse Width: 0.4 ms
Lead Channel Pacing Threshold Pulse Width: 0.4 ms
Lead Channel Setting Pacing Amplitude: 2 V
Lead Channel Setting Pacing Amplitude: 2.5 V
Lead Channel Setting Pacing Pulse Width: 0.4 ms
Lead Channel Setting Sensing Sensitivity: 5.6 mV

## 2016-02-20 NOTE — Progress Notes (Signed)
Remote pacemaker transmission.   

## 2016-03-03 ENCOUNTER — Telehealth: Payer: Self-pay | Admitting: Emergency Medicine

## 2016-03-03 DIAGNOSIS — Z0289 Encounter for other administrative examinations: Secondary | ICD-10-CM

## 2016-03-03 NOTE — Telephone Encounter (Signed)
Life insurance paperwork has been completed and given to MD for Signature, Pt gave verbal okay to fill out paperwork.

## 2016-03-05 ENCOUNTER — Encounter: Payer: Self-pay | Admitting: Cardiology

## 2016-03-08 NOTE — Telephone Encounter (Signed)
Paperwork has been faxed and copy mailed to Pt.

## 2016-03-19 ENCOUNTER — Encounter: Payer: Self-pay | Admitting: Internal Medicine

## 2016-03-19 ENCOUNTER — Ambulatory Visit (INDEPENDENT_AMBULATORY_CARE_PROVIDER_SITE_OTHER): Payer: BLUE CROSS/BLUE SHIELD | Admitting: Internal Medicine

## 2016-03-19 VITALS — BP 130/80 | HR 65 | Ht 64.0 in | Wt 161.4 lb

## 2016-03-19 DIAGNOSIS — E785 Hyperlipidemia, unspecified: Secondary | ICD-10-CM | POA: Diagnosis not present

## 2016-03-19 DIAGNOSIS — I10 Essential (primary) hypertension: Secondary | ICD-10-CM

## 2016-03-19 DIAGNOSIS — I2510 Atherosclerotic heart disease of native coronary artery without angina pectoris: Secondary | ICD-10-CM | POA: Diagnosis not present

## 2016-03-19 DIAGNOSIS — I495 Sick sinus syndrome: Secondary | ICD-10-CM | POA: Diagnosis not present

## 2016-03-19 NOTE — Progress Notes (Signed)
Patient ID: [name] [name], male   DOB: [birth year], 61 y.o.   MRN: [id number] PCP: Dr. Jonny Ruiz  [name] [name] is a 61 yo with male with history of CAD s/p CABG and bradycardia s/p Medtronic pacemaker who presents today for cardiology followup.  He was previously followed by Dr. Shirlee Latch, having last been seen in 02/2015. Today, the patient reports that he has been feeling well last visit. 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 He denies shortness of breath and leg edema as well as palpitations and lightheadedness. He notes some fluctuations in his blood pressure when he checks it at home, though it is almost always less than 130/80. He denies pacemaker problems and continues to follow-up with the pacemaker clinic.  Labs (8/12): LDL 36, HDl 64, TGs 138, K 4.7, creatinine 1.0 Labs (2/14): K 5.1, creatinine 1.2, TSH normal, LDL 63 Labs (10/16): LDL 69, HDL 55, TGs 411, K 5.1, creatinine 1.03 Labs (11/17): K5.2, creatinine 1.25, total cholesterol 176, HDL 63, LDL 77, triglycerides 178, hemoglobin A1c 7.8  ECG: Atrially-paced, nonspecific T wave changes. No change since 03/07/15.  Past Medical History:  Diagnosis Date  . CAD (coronary artery disease)    The patient presented in Aug 2008 with acute cornary syndrome. He did have a bypass surgeru. He did have a 3-vessel disease on heart cathiterization and had bypass surgery with LIMA to the LAD, sequential vein graft to the obtuse marginal - 1 and obtuse marginal -2, and vein graft to the RCA. Adenosine myoview (11/10): EF 64%, normal wall motion, normal perfusion.   [name] [name] GERD (gastroesophageal reflux disease)   . History of echocardiogram    November 2008. EF 65-70%. no regional wall abnormalities. Trivial mitral regurgitation.  [name] [name] HTN (hypertension)   . Hyperlipidemia   . Mild anemia    Hx of post op. 8/08  . Paroxysmal atrial fibrillation (HCC)    only episode noted breifly during hospitalization for PCM  placement 11/10.   . Sick sinus syndrome (HCC)    with presyncope. medtronic dual chamber PCM placed 11/10  . Type II or unspecified type diabetes mellitus without mention of complication, not stated as uncontrolled    Social History   Social History  . Marital status: Married    Spouse name: N/A  . Number of children: N/A  . Years of education: N/A   Occupational History  . Not on file.   Social History Main Topics  . Smoking status: Former Smoker    Packs/day: 1.00    Years: 20.00    Types: Cigarettes    Quit date: 01/11/2006  . Smokeless tobacco: Never Used  . Alcohol use 1.2 oz/week    2 Cans of beer per week     Comment: social on weekends  . Drug use: No  . Sexual activity: Not on file   Other Topics Concern  . Not on file   Social History Narrative   Married, 3 children. To [place] from [place] 1979. Work- sign spray pain- [employer]   Family history: No premature CAD.  Current Outpatient Prescriptions  Medication Sig Dispense Refill  . amLODipine (NORVASC) 5 MG tablet TAKE 1 TABLET BY MOUTH DAILY 90 tablet 0  . aspirin 81 MG tablet Take 1 tablet (81 mg total) by mouth daily.    [name] [name] atorvastatin (LIPITOR) 40 MG tablet TAKE 1 TABLET BY MOUTH DAILY AT 6 PM 90 tablet 0  . fenofibrate (  TRICOR) 145 MG tablet Take 1 tablet (145 mg total) by mouth daily. 90 tablet 3  . lisinopril (PRINIVIL,ZESTRIL) 40 MG tablet TAKE 1 TABLET BY MOUTH EVERY DAY 90 tablet 0  . meloxicam (MOBIC) 15 MG tablet TAKE 1 TABLET BY MOUTH DAILY 90 tablet 0  . metFORMIN (GLUCOPHAGE-XR) 500 MG 24 hr tablet TAKE 3 TABLET(500 MG) BY MOUTH DAILY WITH BREAKFAST 270 tablet 3  . metoprolol (LOPRESSOR) 50 MG tablet TAKE 1 TABLET(50 MG) BY MOUTH TWICE DAILY 180 tablet 1  . Omega-3 Fatty Acids (OMEGA-3 FISH OIL) 1200 MG CAPS Take 1 capsule by mouth 2 (two) times daily.     [name] [name]. omeprazole (PRILOSEC) 20 MG capsule TAKE 1 CAPSULE BY MOUTH TWICE DAILY 180 capsule 0  . traMADol (ULTRAM) 50 MG tablet Take 1 tablet (50  mg total) by mouth at bedtime as needed (pain). 30 tablet 2   No current facility-administered medications for this visit.      PHYSICAL EXAM: BP 130/80 (BP Location: Left Arm, Patient Position: Sitting, Cuff Size: Normal)   Pulse 65   Ht 5\' 4"  (1.626 m)   Wt 161 lb 6.4 oz (73.2 kg)   SpO2 95%   BMI 27.70 kg/m   General:  Well developed, well-nourished man seated comfortably in the exam room. HEENT: No conjunctival pallor or scleral icterus. OP clear. Neck: Supple without lymphadenopathy, thyromegaly, JVD, or HJR. Cor: PMI normal. Regular rate & rhythm. No rubs, gallops or murmurs. Lungs: Normal work of breathing. Clear to auscultation bilaterally without wheezes or crackles. Abdomen: Bowel sounds present. Soft, nontender, nondistended without hepatosplenomegaly. Extremities: no cyanosis, clubbing, rash, edema. 2+ radial and pedal pulses bilaterally.  Assessment/Plan: 1. CAD: Status post CABG.  EF preserved by 2008 echo.  No new symptoms to suggest worsening coronary insufficiency. Continue current medication regimen for secondary prevention. 2. Bradycardia: Status post dual-chamber pacemaker for sick sinus syndrome; continue follow-up in EP clinic. 3. HTN: Blood pressure upper normal today but better controlled at home per patient report. Continue current regimen. 4. Hyperlipidemia: LDL at goal on most recent check in 11/2015. Continue atorvastatin.  Follow-up: Return to clinic in one year.  Martavius Lusty 03/19/2016

## 2016-03-19 NOTE — Patient Instructions (Signed)
Medication Instructions:  Your physician recommends that you continue on your current medications as directed. Please refer to the Current Medication list given to you today.   Labwork: None   Testing/Procedures: None   Follow-Up: Your physician wants you to follow-up in: 1 year with Dr End. (March  2019).  You will receive a reminder letter in the mail two months in advance. If you don't receive a letter, please call our office to schedule the follow-up appointment.        If you need a refill on your cardiac medications before your next appointment, please call your pharmacy.   

## 2016-03-20 DIAGNOSIS — I10 Essential (primary) hypertension: Secondary | ICD-10-CM | POA: Insufficient documentation

## 2016-04-03 ENCOUNTER — Other Ambulatory Visit: Payer: Self-pay | Admitting: Internal Medicine

## 2016-04-04 ENCOUNTER — Other Ambulatory Visit: Payer: Self-pay | Admitting: Internal Medicine

## 2016-04-05 ENCOUNTER — Other Ambulatory Visit: Payer: Self-pay | Admitting: Internal Medicine

## 2016-04-14 ENCOUNTER — Other Ambulatory Visit: Payer: Self-pay | Admitting: Internal Medicine

## 2016-05-03 ENCOUNTER — Telehealth: Payer: Self-pay | Admitting: Internal Medicine

## 2016-05-03 NOTE — Telephone Encounter (Signed)
Patient Name: [name] [name]  DOB: 12/05/1955    Initial Comment Caller started taking metformin 3 -4 months ago. has spots on his back and arms. very itchy    Nurse Assessment  Nurse: Elwyn Lade, RN, Misty Stanley Date/Time (Eastern Time): 05/03/2016 2:59:42 PM  Confirm and document reason for call. If symptomatic, describe symptoms. ---Caller states started taking metformin 3 - 4 months ago; began having rash on back, legs and arms that were very itchy for a few mths; stopped the med 3-4 days ago, still has rash on arms and back but getting better  Does the patient have any new or worsening symptoms? ---Yes  Will a triage be completed? ---Yes  Related visit to physician within the last 2 weeks? ---No  Does the PT have any chronic conditions? (i.e. diabetes, asthma, etc.) ---Yes  List chronic conditions. ---diabetes  Is this a behavioral health or substance abuse call? ---No     Guidelines    Guideline Title Affirmed Question Affirmed Notes  Rash - Widespread On Drugs Hives or itching    Final Disposition User   See Physician within 24 Hours Bur[place], RN, Misty Stanley    Comments  No appt available with PCP; appt scheduled 4pm 05/04/16 with Dr. Kelli Churn   Referrals  REFERRED TO PCP OFFICE   Disagree/Comply: Comply

## 2016-05-03 NOTE — Telephone Encounter (Signed)
Pt called about a possible reaction to Metformin. He said that he has dark spots on his arms, legs and back. I transferred him to [facility] to evaluate these symptoms.

## 2016-05-04 ENCOUNTER — Ambulatory Visit (INDEPENDENT_AMBULATORY_CARE_PROVIDER_SITE_OTHER): Payer: BLUE CROSS/BLUE SHIELD | Admitting: Internal Medicine

## 2016-05-04 ENCOUNTER — Encounter: Payer: Self-pay | Admitting: Internal Medicine

## 2016-05-04 DIAGNOSIS — E119 Type 2 diabetes mellitus without complications: Secondary | ICD-10-CM

## 2016-05-04 DIAGNOSIS — E785 Hyperlipidemia, unspecified: Secondary | ICD-10-CM | POA: Diagnosis not present

## 2016-05-04 DIAGNOSIS — I495 Sick sinus syndrome: Secondary | ICD-10-CM

## 2016-05-04 DIAGNOSIS — R21 Rash and other nonspecific skin eruption: Secondary | ICD-10-CM

## 2016-05-04 DIAGNOSIS — I119 Hypertensive heart disease without heart failure: Secondary | ICD-10-CM | POA: Diagnosis not present

## 2016-05-04 MED ORDER — LORATADINE 10 MG PO TABS: 10.0000 mg | ORAL_TABLET | Freq: Every day | ORAL | 2 refills | Status: AC

## 2016-05-04 MED ORDER — TRIAMCINOLONE ACETONIDE 0.5 % EX CREA: 1.0000 "application " | TOPICAL_CREAM | Freq: Three times a day (TID) | CUTANEOUS | 0 refills | Status: DC

## 2016-05-04 NOTE — Assessment & Plan Note (Signed)
Itchy rash on arms, legs, trunk x 2 months. He relates it to starting Metformin 4-5 mo ago. He stopped it x 1 week and felt better. D/c metformin Start Claritin Emailed Rx Kenalog 0.5%

## 2016-05-04 NOTE — Assessment & Plan Note (Addendum)
Norvasc, lisinopril °

## 2016-05-04 NOTE — Progress Notes (Signed)
Subjective:  Patient ID: [name] [name], male    DOB: 29-Jan-1955  Age: 61 y.o. MRN: [id number]  CC: No chief complaint on file.   HPI [name] [name] presents for itchy rash on arms, legs, trunk x 2 months. He relates it to starting Metformin 4-5 mo ago. He stopped it x 1 week and felt better. F/u DM, HTN  Outpatient Medications Prior to Visit  Medication Sig Dispense Refill  . amLODipine (NORVASC) 5 MG tablet TAKE 1 TABLET BY MOUTH DAILY 90 tablet 3  . aspirin 81 MG tablet Take 1 tablet (81 mg total) by mouth daily.    [name] atorvastatin (LIPITOR) 40 MG tablet TAKE 1 TABLET BY MOUTH DAILY AT 6 PM 90 tablet 3  . fenofibrate (TRICOR) 145 MG tablet Take 1 tablet (145 mg total) by mouth daily. 90 tablet 3  . lisinopril (PRINIVIL,ZESTRIL) 40 MG tablet TAKE 1 TABLET BY MOUTH EVERY DAY 90 tablet 0  . meloxicam (MOBIC) 15 MG tablet TAKE 1 TABLET BY MOUTH DAILY 90 tablet 0  . metoprolol (LOPRESSOR) 50 MG tablet TAKE 1 TABLET(50 MG) BY MOUTH TWICE DAILY 180 tablet 1  . Omega-3 Fatty Acids (OMEGA-3 FISH OIL) 1200 MG CAPS Take 1 capsule by mouth 2 (two) times daily.     [name] omeprazole (PRILOSEC) 20 MG capsule TAKE 1 CAPSULE BY MOUTH TWICE DAILY 180 capsule 0  . traMADol (ULTRAM) 50 MG tablet Take 1 tablet (50 mg total) by mouth at bedtime as needed (pain). 30 tablet 2  . metFORMIN (GLUCOPHAGE-XR) 500 MG 24 hr tablet TAKE 3 TABLET(500 MG) BY MOUTH DAILY WITH BREAKFAST (Patient not taking: Reported on 05/04/2016) 270 tablet 3   No facility-administered medications prior to visit.     ROS Review of Systems  Constitutional: Negative for appetite change, fatigue and unexpected weight change.  HENT: Negative for congestion, nosebleeds, sneezing, sore throat and trouble swallowing.   Eyes: Negative for itching and visual disturbance.  Respiratory: Negative for cough.   Cardiovascular: Negative for chest pain, palpitations and leg swelling.  Gastrointestinal: Negative for abdominal distention, blood in stool, diarrhea  and nausea.  Genitourinary: Negative for frequency and hematuria.  Musculoskeletal: Negative for back pain, gait problem, joint swelling and neck pain.  Skin: Positive for rash.  Neurological: Negative for dizziness, tremors, speech difficulty and weakness.  Psychiatric/Behavioral: Negative for agitation, dysphoric mood and sleep disturbance. The patient is not nervous/anxious.     Objective:  BP 134/76 (BP Location: Left Arm, Patient Position: Sitting, Cuff Size: Large)   Pulse 73   Temp 98.3 F (36.8 C) (Oral)   Ht  (1.626 m)   Wt 163 lb 0.6 oz (74 kg)   SpO2 99%   BMI 27.99 kg/m   BP Readings from Last 3 Encounters:  05/04/16 134/76  03/19/16 130/80  12/02/15 130/70    Wt Readings from Last 3 Encounters:  05/04/16 163 lb 0.6 oz (74 kg)  03/19/16 161 lb 6.4 oz (73.2 kg)  12/02/15 164 lb (74.4 kg)    Physical Exam  Constitutional: He is oriented to person, place, and time. He appears well-developed. No distress.  NAD  HENT:  Mouth/Throat: Oropharynx is clear and moist.  Eyes: Conjunctivae are normal. Pupils are equal, round, and reactive to light.  Neck: Normal range of motion. No JVD present. No thyromegaly present.  Cardiovascular: Normal rate, regular rhythm, normal heart sounds and intact distal pulses.  Exam reveals no gallop and no friction rub.   No murmur  heard. Pulmonary/Chest: Effort normal and breath sounds normal. No respiratory distress. He has no wheezes. He has no rales. He exhibits no tenderness.  Abdominal: Soft. Bowel sounds are normal. He exhibits no distension and no mass. There is no tenderness. There is no rebound and no guarding.  Musculoskeletal: Normal range of motion. He exhibits no edema or tenderness.  Lymphadenopathy:    He has no cervical adenopathy.  Neurological: He is alert and oriented to person, place, and time. He has normal reflexes. No cranial nerve deficit. He exhibits normal muscle tone. He displays a negative Romberg sign.  Coordination and gait normal.  Skin: Skin is warm and dry. Rash noted.  Psychiatric: He has a normal mood and affect. His behavior is normal. Judgment and thought content normal.  red papular rash - arms, legs  Lab Results  Component Value Date   WBC 7.4 05/29/2015   HGB 13.8 05/29/2015   HCT 40.2 05/29/2015   PLT 260.0 05/29/2015   GLUCOSE 127 (H) 12/02/2015   CHOL 176 12/02/2015   TRIG 178.0 (H) 12/02/2015   HDL 63.30 12/02/2015   LDLDIRECT 62.0 05/29/2015   LDLCALC 77 12/02/2015   ALT 18 12/02/2015   AST 17 12/02/2015   NA 139 12/02/2015   K 5.2 (H) 12/02/2015   CL 103 12/02/2015   CREATININE 1.25 12/02/2015   BUN 24 (H) 12/02/2015   CO2 30 12/02/2015   TSH 2.08 05/29/2015   PSA 0.26 05/29/2015   INR 0.94 11/10/2008   HGBA1C 7.8 (H) 12/02/2015   MICROALBUR 3.6 (H) 05/29/2015    No results found.  Assessment & Plan:   There are no diagnoses linked to this encounter. I am having [name] [name] maintain his Omega-3 Fish Oil, aspirin, fenofibrate, traMADol, metFORMIN, metoprolol, amLODipine, atorvastatin, meloxicam, lisinopril, and omeprazole.  No orders of the defined types were placed in this encounter.    Follow-up: No Follow-up on file.  Sonda Primes, MD

## 2016-05-04 NOTE — Patient Instructions (Signed)
Keep office visit with Dr Jonny Ruiz

## 2016-05-04 NOTE — Assessment & Plan Note (Signed)
Pacemaker  

## 2016-05-04 NOTE — Progress Notes (Signed)
Pre visit review using our clinic review tool, if applicable. No additional management support is needed unless otherwise documented below in the visit note. 

## 2016-05-04 NOTE — Assessment & Plan Note (Addendum)
D/c metformin: he had itchy rash on arms, legs, trunk x 2 months. He relates it to starting Metformin 4-5 mo ago. He stopped it x 1 week and felt better.  f/u w/Dr Jonny Ruiz on 5/22 w/labs Diabetic diet Check CBGs at home. Call if >200

## 2016-05-04 NOTE — Assessment & Plan Note (Signed)
Lipitor 

## 2016-05-20 ENCOUNTER — Telehealth: Payer: Self-pay | Admitting: Cardiology

## 2016-05-20 ENCOUNTER — Encounter: Payer: BLUE CROSS/BLUE SHIELD | Admitting: *Deleted

## 2016-05-20 NOTE — Telephone Encounter (Signed)
Spoke with pt and reminded pt of remote transmission that is due today. Pt verbalized understanding.   

## 2016-05-21 ENCOUNTER — Encounter: Payer: Self-pay | Admitting: Cardiology

## 2016-05-27 ENCOUNTER — Other Ambulatory Visit: Payer: Self-pay | Admitting: Internal Medicine

## 2016-06-01 ENCOUNTER — Other Ambulatory Visit (INDEPENDENT_AMBULATORY_CARE_PROVIDER_SITE_OTHER): Payer: BLUE CROSS/BLUE SHIELD

## 2016-06-01 ENCOUNTER — Encounter: Payer: Self-pay | Admitting: Internal Medicine

## 2016-06-01 ENCOUNTER — Ambulatory Visit (INDEPENDENT_AMBULATORY_CARE_PROVIDER_SITE_OTHER): Payer: BLUE CROSS/BLUE SHIELD | Admitting: Internal Medicine

## 2016-06-01 VITALS — BP 110/68 | HR 79 | Ht 64.0 in | Wt 160.0 lb

## 2016-06-01 DIAGNOSIS — E119 Type 2 diabetes mellitus without complications: Secondary | ICD-10-CM

## 2016-06-01 DIAGNOSIS — Z1159 Encounter for screening for other viral diseases: Secondary | ICD-10-CM | POA: Diagnosis not present

## 2016-06-01 DIAGNOSIS — Z114 Encounter for screening for human immunodeficiency virus [HIV]: Secondary | ICD-10-CM

## 2016-06-01 DIAGNOSIS — I10 Essential (primary) hypertension: Secondary | ICD-10-CM

## 2016-06-01 DIAGNOSIS — Z0000 Encounter for general adult medical examination without abnormal findings: Secondary | ICD-10-CM

## 2016-06-01 DIAGNOSIS — R7989 Other specified abnormal findings of blood chemistry: Secondary | ICD-10-CM

## 2016-06-01 LAB — URINALYSIS, ROUTINE W REFLEX MICROSCOPIC
Bilirubin Urine: NEGATIVE
Hgb urine dipstick: NEGATIVE
Ketones, ur: NEGATIVE
Leukocytes, UA: NEGATIVE
Nitrite: NEGATIVE
Specific Gravity, Urine: >=1.03 — AB (ref 1.000–1.030)
Total Protein, Urine: NEGATIVE
Urine Glucose: NEGATIVE
Urobilinogen, UA: 0.2 (ref 0.0–1.0)
pH: 6 (ref 5.0–8.0)

## 2016-06-01 LAB — CBC WITH DIFFERENTIAL/PLATELET
Basophils Absolute: 0.1 10*3/uL (ref 0.0–0.1)
Basophils Relative: 1.5 % (ref 0.0–3.0)
Eosinophils Absolute: 0.5 10*3/uL (ref 0.0–0.7)
Eosinophils Relative: 6.5 % — ABNORMAL HIGH (ref 0.0–5.0)
HCT: 40.3 % (ref 39.0–52.0)
Hemoglobin: 13.7 g/dL (ref 13.0–17.0)
Lymphocytes Relative: 25.5 % (ref 12.0–46.0)
Lymphs Abs: 2 10*3/uL (ref 0.7–4.0)
MCHC: 33.9 g/dL (ref 30.0–36.0)
MCV: 89.1 fl (ref 78.0–100.0)
Monocytes Absolute: 0.7 10*3/uL (ref 0.1–1.0)
Monocytes Relative: 8.4 % (ref 3.0–12.0)
Neutro Abs: 4.5 10*3/uL (ref 1.4–7.7)
Neutrophils Relative %: 58.1 % (ref 43.0–77.0)
Platelets: 301 10*3/uL (ref 150.0–400.0)
RBC: 4.52 Mil/uL (ref 4.22–5.81)
RDW: 12.4 % (ref 11.5–15.5)
WBC: 7.8 10*3/uL (ref 4.0–10.5)

## 2016-06-01 LAB — MICROALBUMIN / CREATININE URINE RATIO
Creatinine,U: 155.7 mg/dL
Microalb Creat Ratio: 0.4 mg/g (ref 0.0–30.0)
Microalb, Ur: <0.7 mg/dL (ref 0.0–1.9)

## 2016-06-01 LAB — HEMOGLOBIN A1C: Hgb A1c MFr Bld: 7.2 % — ABNORMAL HIGH (ref 4.6–6.5)

## 2016-06-01 NOTE — Patient Instructions (Signed)

## 2016-06-01 NOTE — Progress Notes (Signed)
Subjective:    Patient ID: [name] [name], male    DOB: 02/04/1955, 61 y.o.   MRN: [id number]  HPI    Here for wellness and f/u;  Overall doing ok;  Pt denies Chest pain, worsening SOB, DOE, wheezing, orthopnea, PND, worsening Lierman edema, palpitations, dizziness or syncope.  Pt denies neurological change such as new headache, facial or extremity weakness.  Pt denies polydipsia, polyuria, or low sugar symptoms. Pt states overall good compliance with treatment and medications, good tolerability, and has been trying to follow appropriate diet.  Pt denies worsening depressive symptoms, suicidal ideation or panic. No fever, night sweats, wt loss, loss of appetite, or other constitutional symptoms.  Pt states good ability with ADL's, has low fall risk, home safety reviewed and adequate, no other significant changes in hearing or vision, and only occasionally active with exercise, but plans to try to do more soon such as walking.   Past Medical History:  Diagnosis Date  . CAD (coronary artery disease)    The patient presented in Aug 2008 with acute cornary syndrome. He did have a bypass surgeru. He did have a 3-vessel disease on heart cathiterization and had bypass surgery with LIMA to the LAD, sequential vein graft to the obtuse marginal - 1 and obtuse marginal -2, and vein graft to the RCA. Adenosine myoview (11/10): EF 64%, normal wall motion, normal perfusion.   [name]. GERD (gastroesophageal reflux disease)   . History of echocardiogram    November 2008. EF 65-70%. no regional wall abnormalities. Trivial mitral regurgitation.  [name]. HTN (hypertension)   . Hyperlipidemia   . Mild anemia    Hx of post op. 8/08  . Paroxysmal atrial fibrillation (HCC)    only episode noted breifly during hospitalization for PCM placement 11/10.   . Sick sinus syndrome (HCC)    with presyncope. medtronic dual chamber PCM placed 11/10  . Type II or unspecified type diabetes mellitus without mention of complication, not stated as  uncontrolled    Past Surgical History:  Procedure Laterality Date  . CORONARY ARTERY BYPASS GRAFT    . PACEMAKER INSERTION     MDT implanted 2010  . rectal abcess     2005. Dr. Carolynne Edouardoth with interanal sphincterotomy    reports that he quit smoking about 10 years ago. His smoking use included Cigarettes. He has a 20.00 pack-year smoking history. He has never used smokeless tobacco. He reports that he drinks about 1.2 oz of alcohol per week . He reports that he does not use drugs. Family history is unknown by patient. Allergies  Allergen Reactions  . Metformin And Related     rash   Current Outpatient Prescriptions on File Prior to Visit  Medication Sig Dispense Refill  . amLODipine (NORVASC) 5 MG tablet TAKE 1 TABLET BY MOUTH DAILY 90 tablet 3  . aspirin 81 MG tablet Take 1 tablet (81 mg total) by mouth daily.    [name]. atorvastatin (LIPITOR) 40 MG tablet TAKE 1 TABLET BY MOUTH DAILY AT 6 PM 90 tablet 3  . fenofibrate (TRICOR) 145 MG tablet TAKE 1 TABLET(145 MG) BY MOUTH DAILY 90 tablet 1  . lisinopril (PRINIVIL,ZESTRIL) 40 MG tablet TAKE 1 TABLET BY MOUTH EVERY DAY 90 tablet 0  . loratadine (CLARITIN) 10 MG tablet Take 1 tablet (10 mg total) by mouth daily. 30 tablet 2  . meloxicam (MOBIC) 15 MG tablet TAKE 1 TABLET BY MOUTH DAILY 90 tablet 0  . metoprolol (LOPRESSOR) 50 MG tablet TAKE  1 TABLET(50 MG) BY MOUTH TWICE DAILY 180 tablet 1  . Omega-3 Fatty Acids (OMEGA-3 FISH OIL) 1200 MG CAPS Take 1 capsule by mouth 2 (two) times daily.     [name] omeprazole (PRILOSEC) 20 MG capsule TAKE 1 CAPSULE BY MOUTH TWICE DAILY 180 capsule 0  . traMADol (ULTRAM) 50 MG tablet Take 1 tablet (50 mg total) by mouth at bedtime as needed (pain). 30 tablet 2  . triamcinolone cream (KENALOG) 0.5 % Apply 1 application topically 3 (three) times daily. 60 g 0   No current facility-administered medications on file prior to visit.    Review of Systems Constitutional: Negative for other unusual diaphoresis, sweats,  appetite or weight changes HENT: Negative for other worsening hearing loss, ear pain, facial swelling, mouth sores or neck stiffness.   Eyes: Negative for other worsening pain, redness or other visual disturbance.  Respiratory: Negative for other stridor or swelling Cardiovascular: Negative for other palpitations or other chest pain  Gastrointestinal: Negative for worsening diarrhea or loose stools, blood in stool, distention or other pain Genitourinary: Negative for hematuria, flank pain or other change in urine volume.  Musculoskeletal: Negative for myalgias or other joint swelling.  Skin: Negative for other color change, or other wound or worsening drainage.  Neurological: Negative for other syncope or numbness. Hematological: Negative for other adenopathy or swelling Psychiatric/Behavioral: Negative for hallucinations, other worsening agitation, SI, self-injury, or new decreased concentration All other system neg per pt    Objective:   Physical Exam BP 110/68   Pulse 79   Ht 5\' 4"  (1.626 m)   Wt 160 lb (72.6 kg)   SpO2 98%   BMI 27.46 kg/m  VS noted,  Constitutional: Pt is oriented to person, place, and time. Appears well-developed and well-nourished, in no significant distress and comfortable Head: Normocephalic and atraumatic  Eyes: Conjunctivae and EOM are normal. Pupils are equal, round, and reactive to light Right Ear: External ear normal without discharge Left Ear: External ear normal without discharge Nose: Nose without discharge or deformity Mouth/Throat: Oropharynx is without other ulcerations and moist  Neck: Normal range of motion. Neck supple. No JVD present. No tracheal deviation present or significant neck LA or mass Cardiovascular: Normal rate, regular rhythm, normal heart sounds and intact distal pulses.   Pulmonary/Chest: WOB normal and breath sounds without rales or wheezing  Abdominal: Soft. Bowel sounds are normal. NT. No HSM  Musculoskeletal: Normal range  of motion. Exhibits no edema Lymphadenopathy: Has no other cervical adenopathy.  Neurological: Pt is alert and oriented to person, place, and time. Pt has normal reflexes. No cranial nerve deficit. Motor grossly intact, Gait intact Skin: Skin is warm and dry. No rash noted or new ulcerations Psychiatric:  Has normal mood and affect. Behavior is normal without agitation No other exam findings Lab Results  Component Value Date   WBC 7.8 06/01/2016   HGB 13.7 06/01/2016   HCT 40.3 06/01/2016   PLT 301.0 06/01/2016   GLUCOSE 142 (H) 06/01/2016   CHOL 154 06/01/2016   TRIG 220.0 (H) 06/01/2016   HDL 56.00 06/01/2016   LDLDIRECT 62.0 06/01/2016   LDLCALC 77 12/02/2015   ALT 13 06/01/2016   AST 15 06/01/2016   NA 137 06/01/2016   K 4.7 06/01/2016   CL 102 06/01/2016   CREATININE 1.38 06/01/2016   BUN 26 (H) 06/01/2016   CO2 25 06/01/2016   TSH 2.87 06/01/2016   PSA 0.44 06/01/2016   INR 0.94 11/10/2008   HGBA1C 7.2 (H)  06/01/2016   MICROALBUR <0.7 06/01/2016       Assessment & Plan:

## 2016-06-02 LAB — LIPID PANEL
Cholesterol: 154 mg/dL (ref 0–200)
HDL: 56 mg/dL (ref >39.00)
NonHDL: 98.22
Total CHOL/HDL Ratio: 3
Triglycerides: 220 mg/dL — ABNORMAL HIGH (ref 0.0–149.0)
VLDL: 44 mg/dL — ABNORMAL HIGH (ref 0.0–40.0)

## 2016-06-02 LAB — BASIC METABOLIC PANEL
BUN: 26 mg/dL — ABNORMAL HIGH (ref 6–23)
CO2: 25 mEq/L (ref 19–32)
Calcium: 10.6 mg/dL — ABNORMAL HIGH (ref 8.4–10.5)
Chloride: 102 mEq/L (ref 96–112)
Creatinine, Ser: 1.38 mg/dL (ref 0.40–1.50)
GFR: 55.44 mL/min — ABNORMAL LOW (ref >60.00)
Glucose, Bld: 142 mg/dL — ABNORMAL HIGH (ref 70–99)
Potassium: 4.7 mEq/L (ref 3.5–5.1)
Sodium: 137 mEq/L (ref 135–145)

## 2016-06-02 LAB — TSH: TSH: 2.87 u[IU]/mL (ref 0.35–4.50)

## 2016-06-02 LAB — HIV ANTIBODY (ROUTINE TESTING W REFLEX): HIV 1&2 Ab, 4th Generation: NONREACTIVE

## 2016-06-02 LAB — PSA: PSA: 0.44 ng/mL (ref 0.10–4.00)

## 2016-06-02 LAB — HEPATIC FUNCTION PANEL
ALT: 13 U/L (ref 0–53)
AST: 15 U/L (ref 0–37)
Albumin: 4.8 g/dL (ref 3.5–5.2)
Alkaline Phosphatase: 28 U/L — ABNORMAL LOW (ref 39–117)
Bilirubin, Direct: 0.1 mg/dL (ref 0.0–0.3)
Total Bilirubin: 0.3 mg/dL (ref 0.2–1.2)
Total Protein: 7.3 g/dL (ref 6.0–8.3)

## 2016-06-02 LAB — HEPATITIS C ANTIBODY: HCV Ab: NEGATIVE

## 2016-06-02 LAB — LDL CHOLESTEROL, DIRECT: Direct LDL: 62 mg/dL

## 2016-06-03 ENCOUNTER — Ambulatory Visit (INDEPENDENT_AMBULATORY_CARE_PROVIDER_SITE_OTHER): Payer: BLUE CROSS/BLUE SHIELD | Admitting: *Deleted

## 2016-06-03 DIAGNOSIS — I495 Sick sinus syndrome: Secondary | ICD-10-CM

## 2016-06-03 NOTE — Progress Notes (Signed)
Remote pacemaker transmission.   

## 2016-06-04 ENCOUNTER — Encounter: Payer: Self-pay | Admitting: Cardiology

## 2016-06-06 NOTE — Assessment & Plan Note (Signed)
stable overall by history and exam, recent data reviewed with pt, and pt to continue medical treatment as before,  to f/u any worsening symptoms or concerns Lab Results  Component Value Date   HGBA1C 7.2 (H) 06/01/2016    

## 2016-06-06 NOTE — Assessment & Plan Note (Signed)

## 2016-06-06 NOTE — Assessment & Plan Note (Signed)
stable overall by history and exam, recent data reviewed with pt, and pt to continue medical treatment as before,  to f/u any worsening symptoms or concerns BP Readings from Last 3 Encounters:  06/01/16 110/68  05/04/16 134/76  03/19/16 130/80

## 2016-06-08 LAB — CUP PACEART REMOTE DEVICE CHECK
Battery Voltage: 2.78 V
Brady Statistic RA Percent Paced: 64.6 %
Brady Statistic RV Percent Paced: 0.1 % — CL
Date Time Interrogation Session: 20180529121515
Implantable Lead Implant Date: 20101101
Implantable Lead Implant Date: 20101101
Implantable Lead Location: 753859
Implantable Lead Location: 753860
Implantable Lead Model: 5076
Implantable Lead Model: 5076
Implantable Pulse Generator Implant Date: 20101101
Lead Channel Impedance Value: 521 Ohm
Lead Channel Impedance Value: 539 Ohm
Lead Channel Setting Pacing Amplitude: 2 V
Lead Channel Setting Pacing Amplitude: 2.5 V
Lead Channel Setting Pacing Pulse Width: 0.4 ms
Lead Channel Setting Sensing Sensitivity: 4 mV

## 2016-06-18 ENCOUNTER — Encounter: Payer: Self-pay | Admitting: Cardiology

## 2016-07-06 ENCOUNTER — Other Ambulatory Visit: Payer: Self-pay | Admitting: Internal Medicine

## 2016-07-16 ENCOUNTER — Other Ambulatory Visit: Payer: Self-pay | Admitting: Internal Medicine

## 2016-08-31 ENCOUNTER — Encounter: Payer: Self-pay | Admitting: Internal Medicine

## 2016-08-31 ENCOUNTER — Ambulatory Visit (INDEPENDENT_AMBULATORY_CARE_PROVIDER_SITE_OTHER)
Admission: RE | Admit: 2016-08-31 | Discharge: 2016-08-31 | Disposition: A | Discharge status: Home / Self Care | Payer: BLUE CROSS/BLUE SHIELD | Source: Ambulatory Visit | Attending: Internal Medicine | Admitting: Internal Medicine

## 2016-08-31 ENCOUNTER — Ambulatory Visit (INDEPENDENT_AMBULATORY_CARE_PROVIDER_SITE_OTHER): Payer: BLUE CROSS/BLUE SHIELD | Admitting: Internal Medicine

## 2016-08-31 VITALS — BP 124/72 | HR 78 | Temp 98.2°F | Ht 64.0 in | Wt 163.0 lb

## 2016-08-31 DIAGNOSIS — R05 Cough: Secondary | ICD-10-CM | POA: Diagnosis not present

## 2016-08-31 DIAGNOSIS — I10 Essential (primary) hypertension: Secondary | ICD-10-CM | POA: Diagnosis not present

## 2016-08-31 DIAGNOSIS — E119 Type 2 diabetes mellitus without complications: Secondary | ICD-10-CM | POA: Diagnosis not present

## 2016-08-31 DIAGNOSIS — R059 Cough, unspecified: Secondary | ICD-10-CM

## 2016-08-31 MED ORDER — HYDROCODONE-HOMATROPINE 5-1.5 MG/5ML PO SYRP: 5.0000 mL | ORAL_SOLUTION | Freq: Four times a day (QID) | ORAL | 0 refills | Status: AC | PRN

## 2016-08-31 MED ORDER — LEVOFLOXACIN 500 MG PO TABS: 500.0000 mg | ORAL_TABLET | Freq: Every day | ORAL | 0 refills | Status: AC

## 2016-08-31 NOTE — Patient Instructions (Signed)
Please take all new medication as prescribed - the antibiotic, and the cough medicine if needed  The Intal only comes in a nebulized medication that you would not need at this time  Please continue all other medications as before, and refills have been done if requested.  Please have the pharmacy call with any other refills you may need.  Please keep your appointments with your specialists as you may have planned  Please go to the XRAY Department in the Basement (go straight as you get off the elevator) for the x-ray testing  You will be contacted by phone if any changes need to be made immediately.  Otherwise, you will receive a letter about your results with an explanation, but please check with MyChart first.  Please remember to sign up for MyChart if you have not done so, as this will be important to you in the future with finding out test results, communicating by private email, and scheduling acute appointments online when needed.

## 2016-08-31 NOTE — Assessment & Plan Note (Signed)
stable overall by history and exam, recent data reviewed with pt, and pt to continue medical treatment as before,  to f/u any worsening symptoms or concerns BP Readings from Last 3 Encounters:  08/31/16 124/72  06/01/16 110/68  05/04/16 134/76

## 2016-08-31 NOTE — Assessment & Plan Note (Signed)
stable overall by history and exam, recent data reviewed with pt, and pt to continue medical treatment as before,  to f/u any worsening symptoms or concerns Lab Results  Component Value Date   HGBA1C 7.2 (H) 06/01/2016    

## 2016-08-31 NOTE — Assessment & Plan Note (Signed)
Mild to mod, c/w bronchitis vs pna, for cxr, for antibx course, cough med prn,  to f/u any worsening symptoms or concerns 

## 2016-08-31 NOTE — Progress Notes (Signed)
Subjective:    Patient ID: [name] [name], male    DOB: 08/13/1955, 61 y.o.   MRN: [id number]  HPI  Here with acute onset mild to mod 2-3 days ST, HA, general weakness and malaise, with prod cough greenish sputum, but Pt denies chest pain, increased sob or doe, wheezing, orthopnea, PND, increased [place] swelling, palpitations, dizziness or syncope.   Pt denies polydipsia, polyuria.  Pt denies new neurological symptoms such as new headache, or facial or extremity weakness or numbness Past Medical History:  Diagnosis Date  . CAD (coronary artery disease)    The patient presented in Aug 2008 with acute cornary syndrome. He did have a bypass surgeru. He did have a 3-vessel disease on heart cathiterization and had bypass surgery with LIMA to the LAD, sequential vein graft to the obtuse marginal - 1 and obtuse marginal -2, and vein graft to the RCA. Adenosine myoview (11/10): EF 64%, normal wall motion, normal perfusion.   [name] GERD (gastroesophageal reflux disease)   . History of echocardiogram    November 2008. EF 65-70%. no regional wall abnormalities. Trivial mitral regurgitation.  [name] HTN (hypertension)   . Hyperlipidemia   . Mild anemia    Hx of post op. 8/08  . Paroxysmal atrial fibrillation (HCC)    only episode noted breifly during hospitalization for PCM placement 11/10.   . Sick sinus syndrome (HCC)    with presyncope. medtronic dual chamber PCM placed 11/10  . Type II or unspecified type diabetes mellitus without mention of complication, not stated as uncontrolled    Past Surgical History:  Procedure Laterality Date  . CORONARY ARTERY BYPASS GRAFT    . PACEMAKER INSERTION     MDT implanted 2010  . rectal abcess     2005. Dr. Carolynne Edouard with interanal sphincterotomy    reports that he quit smoking about 10 years ago. His smoking use included Cigarettes. He has a 20.00 pack-year smoking history. He has never used smokeless tobacco. He reports that he drinks about 1.2 oz of alcohol per week . He  reports that he does not use drugs. Family history is unknown by patient. Allergies  Allergen Reactions  . Metformin And Related     rash   Current Outpatient Prescriptions on File Prior to Visit  Medication Sig Dispense Refill  . amLODipine (NORVASC) 5 MG tablet TAKE 1 TABLET BY MOUTH DAILY 90 tablet 3  . aspirin 81 MG tablet Take 1 tablet (81 mg total) by mouth daily.    [name] atorvastatin (LIPITOR) 40 MG tablet TAKE 1 TABLET BY MOUTH DAILY AT 6 PM 90 tablet 3  . fenofibrate (TRICOR) 145 MG tablet TAKE 1 TABLET(145 MG) BY MOUTH DAILY 90 tablet 1  . lisinopril (PRINIVIL,ZESTRIL) 40 MG tablet TAKE 1 TABLET BY MOUTH EVERY DAY 90 tablet 3  . loratadine (CLARITIN) 10 MG tablet Take 1 tablet (10 mg total) by mouth daily. 30 tablet 2  . meloxicam (MOBIC) 15 MG tablet TAKE 1 TABLET BY MOUTH DAILY 90 tablet 3  . metoprolol (LOPRESSOR) 50 MG tablet TAKE 1 TABLET(50 MG) BY MOUTH TWICE DAILY 180 tablet 1  . Omega-3 Fatty Acids (OMEGA-3 FISH OIL) 1200 MG CAPS Take 1 capsule by mouth 2 (two) times daily.     [name] omeprazole (PRILOSEC) 20 MG capsule TAKE 1 CAPSULE BY MOUTH TWICE DAILY 180 capsule 3  . traMADol (ULTRAM) 50 MG tablet Take 1 tablet (50 mg total) by mouth at bedtime as needed (pain). 30 tablet 2  .  triamcinolone cream (KENALOG) 0.5 % Apply 1 application topically 3 (three) times daily. 60 g 0   No current facility-administered medications on file prior to visit.    Review of Systems  Constitutional: Negative for other unusual diaphoresis or sweats HENT: Negative for ear discharge or swelling Eyes: Negative for other worsening visual disturbances Respiratory: Negative for stridor or other swelling  Gastrointestinal: Negative for worsening distension or other blood Genitourinary: Negative for retention or other urinary change Musculoskeletal: Negative for other MSK pain or swelling Skin: Negative for color change or other new lesions Neurological: Negative for worsening tremors and other  numbness  Psychiatric/Behavioral: Negative for worsening agitation or other fatigue All other system neg per pt    Objective:   Physical Exam BP 124/72   Pulse 78   Temp 98.2 F (36.8 C)   Ht 5\' 4"  (1.626 m)   Wt 163 lb (73.9 kg)   SpO2 99%   BMI 27.98 kg/m  VS noted, mild ill appearing Constitutional: Pt appears in NAD HENT: Head: NCAT.  Right Ear: External ear normal.  Left Ear: External ear normal.  Eyes: . Pupils are equal, round, and reactive to light. Conjunctivae and EOM are normal Nose: without d/c or deformity Bilat tm's with mild erythema.  Max sinus areas non tender.  Pharynx with mild erythema, no exudate Neck: Neck supple. Gross normal ROM Cardiovascular: Normal rate and regular rhythm.   Pulmonary/Chest: Effort normal and breath sounds decreased without rales or wheezing.  Neurological: Pt is alert. At baseline orientation, motor grossly intact Skin: Skin is warm. No rashes, other new lesions, no [place] edema Psychiatric: Pt behavior is normal without agitation  No other exam findings Lab Results  Component Value Date   WBC 7.8 06/01/2016   HGB 13.7 06/01/2016   HCT 40.3 06/01/2016   PLT 301.0 06/01/2016   GLUCOSE 142 (H) 06/01/2016   CHOL 154 06/01/2016   TRIG 220.0 (H) 06/01/2016   HDL 56.00 06/01/2016   LDLDIRECT 62.0 06/01/2016   LDLCALC 77 12/02/2015   ALT 13 06/01/2016   AST 15 06/01/2016   NA 137 06/01/2016   K 4.7 06/01/2016   CL 102 06/01/2016   CREATININE 1.38 06/01/2016   BUN 26 (H) 06/01/2016   CO2 25 06/01/2016   TSH 2.87 06/01/2016   PSA 0.44 06/01/2016   INR 0.94 11/10/2008   HGBA1C 7.2 (H) 06/01/2016   MICROALBUR <0.7 06/01/2016      Assessment & Plan:

## 2016-09-02 ENCOUNTER — Telehealth: Payer: Self-pay | Admitting: Cardiology

## 2016-09-02 ENCOUNTER — Other Ambulatory Visit: Payer: Self-pay | Admitting: Internal Medicine

## 2016-09-02 ENCOUNTER — Ambulatory Visit (INDEPENDENT_AMBULATORY_CARE_PROVIDER_SITE_OTHER): Payer: BLUE CROSS/BLUE SHIELD | Admitting: *Deleted

## 2016-09-02 DIAGNOSIS — I495 Sick sinus syndrome: Secondary | ICD-10-CM

## 2016-09-02 DIAGNOSIS — R911 Solitary pulmonary nodule: Secondary | ICD-10-CM

## 2016-09-02 NOTE — Telephone Encounter (Signed)
Spoke with pt and reminded pt of remote transmission that is due today. Pt verbalized understanding.   

## 2016-09-03 ENCOUNTER — Telehealth: Payer: Self-pay | Admitting: Internal Medicine

## 2016-09-03 ENCOUNTER — Ambulatory Visit (INDEPENDENT_AMBULATORY_CARE_PROVIDER_SITE_OTHER)
Admission: RE | Admit: 2016-09-03 | Discharge: 2016-09-03 | Disposition: A | Discharge status: Home / Self Care | Payer: BLUE CROSS/BLUE SHIELD | Source: Ambulatory Visit | Attending: Internal Medicine | Admitting: Internal Medicine

## 2016-09-03 DIAGNOSIS — R911 Solitary pulmonary nodule: Secondary | ICD-10-CM

## 2016-09-03 LAB — CUP PACEART REMOTE DEVICE CHECK
Battery Impedance: 545 Ohm
Battery Remaining Longevity: 89 mo
Battery Voltage: 2.78 V
Brady Statistic AP VP Percent: 0 %
Brady Statistic AP VS Percent: 51 %
Brady Statistic AS VP Percent: 0 %
Brady Statistic AS VS Percent: 48 %
Date Time Interrogation Session: 20180824161524
Implantable Lead Implant Date: 20101101
Implantable Lead Implant Date: 20101101
Implantable Lead Location: 753859
Implantable Lead Location: 753860
Implantable Lead Model: 5076
Implantable Lead Model: 5076
Implantable Pulse Generator Implant Date: 20101101
Lead Channel Impedance Value: 516 Ohm
Lead Channel Impedance Value: 562 Ohm
Lead Channel Pacing Threshold Amplitude: 0.625 V
Lead Channel Pacing Threshold Amplitude: 0.75 V
Lead Channel Pacing Threshold Pulse Width: 0.4 ms
Lead Channel Pacing Threshold Pulse Width: 0.4 ms
Lead Channel Setting Pacing Amplitude: 2 V
Lead Channel Setting Pacing Amplitude: 2.5 V
Lead Channel Setting Pacing Pulse Width: 0.4 ms
Lead Channel Setting Sensing Sensitivity: 5.6 mV

## 2016-09-03 NOTE — Telephone Encounter (Signed)
Pt made aware he can send his transmission at any time today. He will do it at this time.

## 2016-09-03 NOTE — Progress Notes (Signed)
Remote pacemaker transmission.   

## 2016-09-03 NOTE — Telephone Encounter (Signed)
New message    Pt is calling stating he missed his home transmission yesterday. He is asking can he go ahead and send it today?

## 2016-09-07 ENCOUNTER — Other Ambulatory Visit: Payer: Self-pay | Admitting: Internal Medicine

## 2016-09-07 DIAGNOSIS — R918 Other nonspecific abnormal finding of lung field: Secondary | ICD-10-CM

## 2016-09-08 ENCOUNTER — Telehealth: Payer: Self-pay

## 2016-09-08 NOTE — Telephone Encounter (Signed)
Informed pt and he expressed understanding.  

## 2016-09-08 NOTE — Telephone Encounter (Signed)
-----   Message from Corwin LevinsJames W John, MD sent at 09/07/2016  8:30 PM EDT ----- Left message on MyChart, pt to cont same tx except  The test results show that your current treatment is OK, except there is still the suspicion for a nodule to the left lung.  We will need to check further with a CT scan of the chest, and you should be called about this soon.    Shirron to please inform pt, I will do order

## 2016-09-10 ENCOUNTER — Ambulatory Visit (INDEPENDENT_AMBULATORY_CARE_PROVIDER_SITE_OTHER)
Admission: RE | Admit: 2016-09-10 | Discharge: 2016-09-10 | Disposition: A | Discharge status: Home / Self Care | Payer: BLUE CROSS/BLUE SHIELD | Source: Ambulatory Visit | Attending: Internal Medicine | Admitting: Internal Medicine

## 2016-09-10 ENCOUNTER — Encounter: Payer: Self-pay | Admitting: Cardiology

## 2016-09-10 ENCOUNTER — Telehealth: Payer: Self-pay

## 2016-09-10 DIAGNOSIS — R918 Other nonspecific abnormal finding of lung field: Secondary | ICD-10-CM | POA: Diagnosis not present

## 2016-09-10 DIAGNOSIS — R911 Solitary pulmonary nodule: Secondary | ICD-10-CM | POA: Diagnosis not present

## 2016-09-10 NOTE — Telephone Encounter (Signed)
-----   Message from Corwin LevinsJames W John, MD sent at 09/10/2016  2:10 PM EDT ----- Left message on MyChart, pt to cont same tx   Good News!  The test results show that your current treatment is OK, as there was no spot in the lungs found on the Ct scan..    There is no other need for change of treatment or further evaluation based on these results, at this time.    [name] [name] to please inform pt, no further evaluation needed

## 2016-09-10 NOTE — Telephone Encounter (Signed)
Pt notified, expressed understanding.

## 2016-09-24 ENCOUNTER — Encounter: Payer: Self-pay | Admitting: Cardiology

## 2016-11-25 ENCOUNTER — Other Ambulatory Visit: Payer: Self-pay | Admitting: Internal Medicine

## 2016-12-07 ENCOUNTER — Ambulatory Visit: Payer: BLUE CROSS/BLUE SHIELD | Admitting: Internal Medicine

## 2016-12-07 ENCOUNTER — Encounter: Payer: Self-pay | Admitting: Internal Medicine

## 2016-12-07 ENCOUNTER — Other Ambulatory Visit (INDEPENDENT_AMBULATORY_CARE_PROVIDER_SITE_OTHER): Payer: BLUE CROSS/BLUE SHIELD

## 2016-12-07 VITALS — BP 126/82 | HR 78 | Temp 98.2°F | Ht 64.0 in | Wt 163.0 lb

## 2016-12-07 DIAGNOSIS — K8020 Calculus of gallbladder without cholecystitis without obstruction: Secondary | ICD-10-CM

## 2016-12-07 DIAGNOSIS — E119 Type 2 diabetes mellitus without complications: Secondary | ICD-10-CM | POA: Diagnosis not present

## 2016-12-07 DIAGNOSIS — R21 Rash and other nonspecific skin eruption: Secondary | ICD-10-CM | POA: Diagnosis not present

## 2016-12-07 DIAGNOSIS — K8081 Other cholelithiasis with obstruction: Secondary | ICD-10-CM | POA: Diagnosis not present

## 2016-12-07 DIAGNOSIS — E785 Hyperlipidemia, unspecified: Secondary | ICD-10-CM | POA: Diagnosis not present

## 2016-12-07 DIAGNOSIS — Z0000 Encounter for general adult medical examination without abnormal findings: Secondary | ICD-10-CM | POA: Diagnosis not present

## 2016-12-07 DIAGNOSIS — I10 Essential (primary) hypertension: Secondary | ICD-10-CM | POA: Diagnosis not present

## 2016-12-07 HISTORY — DX: Calculus of gallbladder without cholecystitis without obstruction: K80.20

## 2016-12-07 LAB — BASIC METABOLIC PANEL
BUN: 19 mg/dL (ref 6–23)
CO2: 29 mEq/L (ref 19–32)
Calcium: 10.2 mg/dL (ref 8.4–10.5)
Chloride: 101 mEq/L (ref 96–112)
Creatinine, Ser: 1.11 mg/dL (ref 0.40–1.50)
GFR: 71.15 mL/min (ref >60.00)
Glucose, Bld: 145 mg/dL — ABNORMAL HIGH (ref 70–99)
Potassium: 4.3 mEq/L (ref 3.5–5.1)
Sodium: 137 mEq/L (ref 135–145)

## 2016-12-07 LAB — HEPATIC FUNCTION PANEL
ALT: 17 U/L (ref 0–53)
AST: 17 U/L (ref 0–37)
Albumin: 4.5 g/dL (ref 3.5–5.2)
Alkaline Phosphatase: 32 U/L — ABNORMAL LOW (ref 39–117)
Bilirubin, Direct: 0.1 mg/dL (ref 0.0–0.3)
Total Bilirubin: 0.4 mg/dL (ref 0.2–1.2)
Total Protein: 7.6 g/dL (ref 6.0–8.3)

## 2016-12-07 LAB — LIPID PANEL
Cholesterol: 172 mg/dL (ref 0–200)
HDL: 60.4 mg/dL (ref >39.00)
NonHDL: 111.28
Total CHOL/HDL Ratio: 3
Triglycerides: 206 mg/dL — ABNORMAL HIGH (ref 0.0–149.0)
VLDL: 41.2 mg/dL — ABNORMAL HIGH (ref 0.0–40.0)

## 2016-12-07 LAB — HEMOGLOBIN A1C: Hgb A1c MFr Bld: 7.6 % — ABNORMAL HIGH (ref 4.6–6.5)

## 2016-12-07 LAB — LDL CHOLESTEROL, DIRECT: Direct LDL: 79 mg/dL

## 2016-12-07 NOTE — Assessment & Plan Note (Signed)
Noted on recent ct, asympt, d/w pt, ok to follow

## 2016-12-07 NOTE — Patient Instructions (Addendum)
Please continue all other medications as before, and refills have been done if requested.  Please have the pharmacy call with any other refills you may need.  Please continue your efforts at being more active, low cholesterol diabetic diet, and weight control..  Please keep your appointments with your specialists as you may have planned  You will be contacted regarding the referral for: dermatology  Please go to the LAB in the Basement (turn left off the elevator) for the tests to be done today  You will be contacted by phone if any changes need to be made immediately.  Otherwise, you will receive a letter about your results with an explanation, but please check with MyChart first.  Please remember to sign up for MyChart if you have not done so, as this will be important to you in the future with finding out test results, communicating by private email, and scheduling acute appointments online when needed.  Please return in 6 months, or sooner if needed, with Lab testing done 3-5 days before

## 2016-12-07 NOTE — Addendum Note (Signed)
Addended by: Corwin LevinsJOHN, JAMES W on: 12/07/2016 07:13 PM   Modules accepted: Orders

## 2016-12-07 NOTE — Assessment & Plan Note (Signed)
stable overall by history and exam, recent data reviewed with pt, and pt to continue medical treatment as before,  to f/u any worsening symptoms or concerns, for f/u lab today 

## 2016-12-07 NOTE — Assessment & Plan Note (Signed)
stable overall by history and exam, recent data reviewed with pt, and pt to continue medical treatment as before,  to f/u any worsening symptoms or concerns BP Readings from Last 3 Encounters:  12/07/16 126/82  08/31/16 124/72  06/01/16 110/68

## 2016-12-07 NOTE — Progress Notes (Signed)
Subjective:    Patient ID: [name] [name], male    DOB: 08/07/1955, 61 y.o.   MRN: [id number]  HPI Here to f/u; overall doing ok,  Pt denies chest pain, increasing sob or doe, wheezing, orthopnea, PND, increased [place] swelling, palpitations, dizziness or syncope.  Pt denies new neurological symptoms such as new headache, or facial or extremity weakness or numbness.  Pt denies polydipsia, polyuria, or low sugar episode.  Pt states overall good compliance with meds, mostly trying to follow appropriate diet, with wt overall stable,  but little exercise however. Recurring right shoulder pain, saw ortho, did not want cortisone.  Denies worsening reflux, abd pain, dysphagia, n/v, bowel change or blood. Wt Readings from Last 3 Encounters:  12/07/16 163 lb (73.9 kg)  08/31/16 163 lb (73.9 kg)  06/01/16 160 lb (72.6 kg)   Past Medical History:  Diagnosis Date  . CAD (coronary artery disease)    The patient presented in Aug 2008 with acute cornary syndrome. He did have a bypass surgeru. He did have a 3-vessel disease on heart cathiterization and had bypass surgery with LIMA to the LAD, sequential vein graft to the obtuse marginal - 1 and obtuse marginal -2, and vein graft to the RCA. Adenosine myoview (11/10): EF 64%, normal wall motion, normal perfusion.   . Cholelithiasis 12/07/2016  . GERD (gastroesophageal reflux disease)   . History of echocardiogram    November 2008. EF 65-70%. no regional wall abnormalities. Trivial mitral regurgitation.  [name]. HTN (hypertension)   . Hyperlipidemia   . Mild anemia    Hx of post op. 8/08  . Paroxysmal atrial fibrillation (HCC)    only episode noted breifly during hospitalization for PCM placement 11/10.   . Sick sinus syndrome (HCC)    with presyncope. medtronic dual chamber PCM placed 11/10  . Type II or unspecified type diabetes mellitus without mention of complication, not stated as uncontrolled    Past Surgical History:  Procedure Laterality Date  . CORONARY  ARTERY BYPASS GRAFT    . PACEMAKER INSERTION     MDT implanted 2010  . rectal abcess     2005. Dr. Carolynne Edouardoth with interanal sphincterotomy    reports that he quit smoking about 10 years ago. His smoking use included cigarettes. He has a 20.00 pack-year smoking history. he has never used smokeless tobacco. He reports that he drinks about 1.2 oz of alcohol per week. He reports that he does not use drugs. Family history is unknown by patient. Allergies  Allergen Reactions  . Metformin And Related     rash   Current Outpatient Medications on File Prior to Visit  Medication Sig Dispense Refill  . amLODipine (NORVASC) 5 MG tablet TAKE 1 TABLET BY MOUTH DAILY 90 tablet 3  . aspirin 81 MG tablet Take 1 tablet (81 mg total) by mouth daily.    [name]. atorvastatin (LIPITOR) 40 MG tablet TAKE 1 TABLET BY MOUTH DAILY AT 6 PM 90 tablet 3  . fenofibrate (TRICOR) 145 MG tablet TAKE 1 TABLET(145 MG) BY MOUTH DAILY 90 tablet 0  . lisinopril (PRINIVIL,ZESTRIL) 40 MG tablet TAKE 1 TABLET BY MOUTH EVERY DAY 90 tablet 3  . loratadine (CLARITIN) 10 MG tablet Take 1 tablet (10 mg total) by mouth daily. 30 tablet 2  . meloxicam (MOBIC) 15 MG tablet TAKE 1 TABLET BY MOUTH DAILY 90 tablet 3  . metoprolol (LOPRESSOR) 50 MG tablet TAKE 1 TABLET(50 MG) BY MOUTH TWICE DAILY 180 tablet 1  .  Omega-3 Fatty Acids (OMEGA-3 FISH OIL) 1200 MG CAPS Take 1 capsule by mouth 2 (two) times daily.     [name]. omeprazole (PRILOSEC) 20 MG capsule TAKE 1 CAPSULE BY MOUTH TWICE DAILY 180 capsule 3   No current facility-administered medications on file prior to visit.    Review of Systems  Constitutional: Negative for other unusual diaphoresis or sweats HENT: Negative for ear discharge or swelling Eyes: Negative for other worsening visual disturbances Respiratory: Negative for stridor or other swelling  Gastrointestinal: Negative for worsening distension or other blood Genitourinary: Negative for retention or other urinary  change Musculoskeletal: Negative for other MSK pain or swelling Skin: Negative for color change or other new lesions Neurological: Negative for worsening tremors and other numbness  Psychiatric/Behavioral: Negative for worsening agitation or other fatigue All other system neg per pt    Objective:   Physical Exam BP 126/82   Pulse 78   Temp 98.2 F (36.8 C) (Oral)   Ht 5\' 4"  (1.626 m)   Wt 163 lb (73.9 kg)   SpO2 98%   BMI 27.98 kg/m  VS noted, not ill appearing Constitutional: Pt appears in NAD HENT: Head: NCAT.  Right Ear: External ear normal.  Left Ear: External ear normal.  Eyes: . Pupils are equal, round, and reactive to light. Conjunctivae and EOM are normal Nose: without d/c or deformity Neck: Neck supple. Gross normal ROM Cardiovascular: Normal rate and regular rhythm.   Pulmonary/Chest: Effort normal and breath sounds without rales or wheezing.  Neurological: Pt is alert. At baseline orientation, motor grossly intact Skin: Skin is warm. + small erythem lesion type rash to bilat arms, no other new lesions, no [place] edema Psychiatric: Pt behavior is normal without agitation  No other exam findings     Assessment & Plan:

## 2016-12-07 NOTE — Assessment & Plan Note (Signed)
Lab Results  Component Value Date   LDLCALC 77 12/02/2015  stable overall by history and exam, recent data reviewed with pt, and pt to continue medical treatment as before,  to f/u any worsening symptoms or concerns, for f/u lab today, goal ldl < 70

## 2016-12-07 NOTE — Assessment & Plan Note (Signed)
For derm referral 

## 2016-12-09 ENCOUNTER — Telehealth: Payer: Self-pay | Admitting: Cardiology

## 2016-12-09 ENCOUNTER — Ambulatory Visit (INDEPENDENT_AMBULATORY_CARE_PROVIDER_SITE_OTHER): Payer: BLUE CROSS/BLUE SHIELD | Admitting: *Deleted

## 2016-12-09 DIAGNOSIS — I495 Sick sinus syndrome: Secondary | ICD-10-CM

## 2016-12-09 NOTE — Telephone Encounter (Signed)
Spoke with pt and reminded pt of remote transmission that is due today. Pt verbalized understanding.   

## 2016-12-10 ENCOUNTER — Encounter: Payer: Self-pay | Admitting: Cardiology

## 2016-12-10 NOTE — Progress Notes (Signed)
Remote pacemaker transmission.   

## 2016-12-13 LAB — CUP PACEART REMOTE DEVICE CHECK
Battery Impedance: 596 Ohm
Battery Remaining Longevity: 87 mo
Battery Voltage: 2.78 V
Brady Statistic AP VP Percent: 0 %
Brady Statistic AP VS Percent: 44 %
Brady Statistic AS VP Percent: 0 %
Brady Statistic AS VS Percent: 56 %
Date Time Interrogation Session: 20181129200551
Implantable Lead Implant Date: 20101101
Implantable Lead Implant Date: 20101101
Implantable Lead Location: 753859
Implantable Lead Location: 753860
Implantable Lead Model: 5076
Implantable Lead Model: 5076
Implantable Pulse Generator Implant Date: 20101101
Lead Channel Impedance Value: 510 Ohm
Lead Channel Impedance Value: 515 Ohm
Lead Channel Pacing Threshold Amplitude: 0.5 V
Lead Channel Pacing Threshold Amplitude: 0.75 V
Lead Channel Pacing Threshold Pulse Width: 0.4 ms
Lead Channel Pacing Threshold Pulse Width: 0.4 ms
Lead Channel Sensing Intrinsic Amplitude: 1.4 mV
Lead Channel Sensing Intrinsic Amplitude: 11.2 mV
Lead Channel Setting Pacing Amplitude: 2 V
Lead Channel Setting Pacing Amplitude: 2.5 V
Lead Channel Setting Pacing Pulse Width: 0.4 ms
Lead Channel Setting Sensing Sensitivity: 5.6 mV

## 2017-01-12 ENCOUNTER — Encounter: Payer: BLUE CROSS/BLUE SHIELD | Admitting: Nurse Practitioner

## 2017-02-22 ENCOUNTER — Other Ambulatory Visit: Payer: Self-pay | Admitting: Internal Medicine

## 2017-03-10 ENCOUNTER — Telehealth: Payer: Self-pay | Admitting: Cardiology

## 2017-03-10 ENCOUNTER — Ambulatory Visit (INDEPENDENT_AMBULATORY_CARE_PROVIDER_SITE_OTHER): Payer: BLUE CROSS/BLUE SHIELD | Admitting: *Deleted

## 2017-03-10 DIAGNOSIS — I495 Sick sinus syndrome: Secondary | ICD-10-CM | POA: Diagnosis not present

## 2017-03-10 NOTE — Telephone Encounter (Signed)
LMOVM reminding pt to send remote transmission.   

## 2017-03-11 ENCOUNTER — Encounter: Payer: Self-pay | Admitting: Cardiology

## 2017-03-11 NOTE — Progress Notes (Signed)
Remote pacemaker transmission.   

## 2017-03-16 ENCOUNTER — Ambulatory Visit (INDEPENDENT_AMBULATORY_CARE_PROVIDER_SITE_OTHER): Payer: BLUE CROSS/BLUE SHIELD | Admitting: Internal Medicine

## 2017-03-16 ENCOUNTER — Encounter: Payer: Self-pay | Admitting: Internal Medicine

## 2017-03-16 VITALS — BP 138/74 | HR 73 | Ht 64.0 in | Wt 163.0 lb

## 2017-03-16 DIAGNOSIS — I2510 Atherosclerotic heart disease of native coronary artery without angina pectoris: Secondary | ICD-10-CM | POA: Diagnosis not present

## 2017-03-16 DIAGNOSIS — I10 Essential (primary) hypertension: Secondary | ICD-10-CM

## 2017-03-16 DIAGNOSIS — Z950 Presence of cardiac pacemaker: Secondary | ICD-10-CM

## 2017-03-16 DIAGNOSIS — I495 Sick sinus syndrome: Secondary | ICD-10-CM | POA: Diagnosis not present

## 2017-03-16 LAB — CUP PACEART INCLINIC DEVICE CHECK
Battery Impedance: 645 Ohm
Battery Remaining Longevity: 84 mo
Battery Voltage: 2.78 V
Brady Statistic AP VP Percent: 0 %
Brady Statistic AP VS Percent: 40 %
Brady Statistic AS VP Percent: 0 %
Brady Statistic AS VS Percent: 60 %
Date Time Interrogation Session: 20190306155720
Implantable Lead Implant Date: 20101101
Implantable Lead Implant Date: 20101101
Implantable Lead Location: 753859
Implantable Lead Location: 753860
Implantable Lead Model: 5076
Implantable Lead Model: 5076
Implantable Pulse Generator Implant Date: 20101101
Lead Channel Impedance Value: 539 Ohm
Lead Channel Impedance Value: 585 Ohm
Lead Channel Pacing Threshold Amplitude: 0.5 V
Lead Channel Pacing Threshold Amplitude: 0.75 V
Lead Channel Pacing Threshold Pulse Width: 0.4 ms
Lead Channel Pacing Threshold Pulse Width: 0.4 ms
Lead Channel Sensing Intrinsic Amplitude: 15.67 mV
Lead Channel Sensing Intrinsic Amplitude: 2 mV
Lead Channel Setting Pacing Amplitude: 2 V
Lead Channel Setting Pacing Amplitude: 2.5 V
Lead Channel Setting Pacing Pulse Width: 0.4 ms
Lead Channel Setting Sensing Sensitivity: 5.6 mV

## 2017-03-16 NOTE — Patient Instructions (Addendum)
Medication Instructions:  Your physician recommends that you continue on your current medications as directed. Please refer to the Current Medication list given to you today.  Please try Mederma cream for your skin.  Labwork: None ordered.  Testing/Procedures: None ordered.  Follow-Up: Your physician wants you to follow-up in: one year with Gypsy BalsamAmber Seiler, NP.   You will receive a reminder letter in the mail two months in advance. If you don't receive a letter, please call our office to schedule the follow-up appointment.  Remote monitoring is used to monitor your Pacemaker from home. This monitoring reduces the number of office visits required to check your device to one time per year. It allows us to keep an eye on the functioning of your device to ensure it is working properly. You are scheduled for a device check from home on 06/09/2017. You may send your transmission at any time that day. If you have a wireless device, the transmission will be sent automatically. After your physician reviews your transmission, you will receive a postcard with your next transmission date.  Any Other Special Instructions Will Be Listed Below (If Applicable).  If you need a refill on your cardiac medications before your next appointment, please call your pharmacy.

## 2017-03-16 NOTE — Progress Notes (Signed)
PCP: Corwin Levins, MD Primary Cardiologist:  Dr End Primary EP:  Dr Ella Bodo [name] [name] is a 62 y.o. male who presents today for routine electrophysiology followup.  Since last being seen in our clinic, the patient reports doing very well.  Today, he denies symptoms of palpitations, chest pain, shortness of breath,  lower extremity edema, dizziness, presyncope, or syncope.  The patient is otherwise without complaint today.   Past Medical History:  Diagnosis Date  . CAD (coronary artery disease)    The patient presented in Aug 2008 with acute cornary syndrome. He did have a bypass surgeru. He did have a 3-vessel disease on heart cathiterization and had bypass surgery with LIMA to the LAD, sequential vein graft to the obtuse marginal - 1 and obtuse marginal -2, and vein graft to the RCA. Adenosine myoview (11/10): EF 64%, normal wall motion, normal perfusion.   . Cholelithiasis 12/07/2016  . GERD (gastroesophageal reflux disease)   . History of echocardiogram    November 2008. EF 65-70%. no regional wall abnormalities. Trivial mitral regurgitation.  [name] HTN (hypertension)   . Hyperlipidemia   . Mild anemia    Hx of post op. 8/08  . Paroxysmal atrial fibrillation (HCC)    only episode noted breifly during hospitalization for PCM placement 11/10.   . Sick sinus syndrome (HCC)    with presyncope. medtronic dual chamber PCM placed 11/10  . Type II or unspecified type diabetes mellitus without mention of complication, not stated as uncontrolled    Past Surgical History:  Procedure Laterality Date  . CORONARY ARTERY BYPASS GRAFT    . PACEMAKER INSERTION     MDT implanted 2010  . rectal abcess     2005. Dr. Carolynne Edouard with interanal sphincterotomy    ROS- all systems are reviewed and negative except as per HPI above  Current Outpatient Medications  Medication Sig Dispense Refill  . amLODipine (NORVASC) 5 MG tablet TAKE 1 TABLET BY MOUTH DAILY 90 tablet 3  . aspirin 81 MG tablet Take 1  tablet (81 mg total) by mouth daily.    [name] atorvastatin (LIPITOR) 40 MG tablet TAKE 1 TABLET BY MOUTH DAILY AT 6 PM 90 tablet 3  . fenofibrate (TRICOR) 145 MG tablet TAKE 1 TABLET(145 MG) BY MOUTH DAILY 90 tablet 0  . lisinopril (PRINIVIL,ZESTRIL) 40 MG tablet TAKE 1 TABLET BY MOUTH EVERY DAY 90 tablet 3  . loratadine (CLARITIN) 10 MG tablet Take 1 tablet (10 mg total) by mouth daily. 30 tablet 2  . meloxicam (MOBIC) 15 MG tablet TAKE 1 TABLET BY MOUTH DAILY 90 tablet 3  . metoprolol (LOPRESSOR) 50 MG tablet TAKE 1 TABLET(50 MG) BY MOUTH TWICE DAILY 180 tablet 1  . Omega-3 Fatty Acids (OMEGA-3 FISH OIL) 1200 MG CAPS Take 1 capsule by mouth 2 (two) times daily.     [name] omeprazole (PRILOSEC) 20 MG capsule TAKE 1 CAPSULE BY MOUTH TWICE DAILY 180 capsule 3   No current facility-administered medications for this visit.     Physical Exam: Vitals:   03/16/17 1517  BP: 138/74  Pulse: 73  Weight: 163 lb (73.9 kg)  Height: 5\' 4"  (1.626 m)    GEN- The patient is well appearing, alert and oriented x 3 today.   Head- normocephalic, atraumatic Eyes-  Sclera clear, conjunctiva pink Ears- hearing intact Oropharynx- clear Lungs- Clear to ausculation bilaterally, normal work of breathing Chest- pacemaker pocket is well healed Heart- Regular rate and rhythm, no murmurs, rubs  or gallops, PMI not laterally displaced GI- soft, NT, ND, + BS Extremities- no clubbing, cyanosis, or edema  Pacemaker interrogation- reviewed in detail today,  See PACEART report  ekg tracing ordered today is personally reviewed and shows atrial paced rhythm  Assessment and Plan:  1. Symptomatic sinus bradycardia  Normal pacemaker function See Pace Art report No changes today  2. Hypertensive cardiovascular disease Stable No change required today  3. CAD s/p CABG No ischemic symptoms No changes  [facility] Return to see EP NP in a year  Hillis RangeJames Cecillia Menees MD, [facility]auFACC 03/16/2017 3:31 PM

## 2017-03-23 ENCOUNTER — Other Ambulatory Visit: Payer: Self-pay | Admitting: Internal Medicine

## 2017-03-26 LAB — CUP PACEART REMOTE DEVICE CHECK
Battery Impedance: 670 Ohm
Battery Remaining Longevity: 82 mo
Battery Voltage: 2.78 V
Brady Statistic AP VP Percent: 0 %
Brady Statistic AP VS Percent: 41 %
Brady Statistic AS VP Percent: 0 %
Brady Statistic AS VS Percent: 59 %
Date Time Interrogation Session: 20190228221259
Implantable Lead Implant Date: 20101101
Implantable Lead Implant Date: 20101101
Implantable Lead Location: 753859
Implantable Lead Location: 753860
Implantable Lead Model: 5076
Implantable Lead Model: 5076
Implantable Pulse Generator Implant Date: 20101101
Lead Channel Impedance Value: 537 Ohm
Lead Channel Impedance Value: 580 Ohm
Lead Channel Pacing Threshold Amplitude: 0.5 V
Lead Channel Pacing Threshold Amplitude: 0.75 V
Lead Channel Pacing Threshold Pulse Width: 0.4 ms
Lead Channel Pacing Threshold Pulse Width: 0.4 ms
Lead Channel Setting Pacing Amplitude: 2 V
Lead Channel Setting Pacing Amplitude: 2.5 V
Lead Channel Setting Pacing Pulse Width: 0.4 ms
Lead Channel Setting Sensing Sensitivity: 5.6 mV

## 2017-05-21 ENCOUNTER — Other Ambulatory Visit: Payer: Self-pay | Admitting: Internal Medicine

## 2017-06-08 ENCOUNTER — Other Ambulatory Visit: Payer: Self-pay | Admitting: Internal Medicine

## 2017-06-08 ENCOUNTER — Other Ambulatory Visit (INDEPENDENT_AMBULATORY_CARE_PROVIDER_SITE_OTHER): Payer: BLUE CROSS/BLUE SHIELD

## 2017-06-08 ENCOUNTER — Ambulatory Visit: Payer: BLUE CROSS/BLUE SHIELD | Admitting: Internal Medicine

## 2017-06-08 ENCOUNTER — Encounter: Payer: Self-pay | Admitting: Internal Medicine

## 2017-06-08 VITALS — BP 114/68 | HR 82 | Temp 97.8°F | Ht 64.0 in | Wt 161.0 lb

## 2017-06-08 DIAGNOSIS — Z0000 Encounter for general adult medical examination without abnormal findings: Secondary | ICD-10-CM

## 2017-06-08 DIAGNOSIS — E119 Type 2 diabetes mellitus without complications: Secondary | ICD-10-CM

## 2017-06-08 DIAGNOSIS — Z23 Encounter for immunization: Secondary | ICD-10-CM | POA: Diagnosis not present

## 2017-06-08 LAB — URINALYSIS, ROUTINE W REFLEX MICROSCOPIC
Bilirubin Urine: NEGATIVE
Hgb urine dipstick: NEGATIVE
Ketones, ur: NEGATIVE
Leukocytes, UA: NEGATIVE
Nitrite: NEGATIVE
RBC / HPF: NONE SEEN (ref 0–?)
Specific Gravity, Urine: >=1.03 — AB (ref 1.000–1.030)
Total Protein, Urine: NEGATIVE
Urine Glucose: NEGATIVE
Urobilinogen, UA: 0.2 (ref 0.0–1.0)
pH: 6 (ref 5.0–8.0)

## 2017-06-08 LAB — LIPID PANEL
Cholesterol: 149 mg/dL (ref 0–200)
HDL: 61.1 mg/dL (ref >39.00)
LDL Cholesterol: 48 mg/dL (ref 0–99)
NonHDL: 87.8
Total CHOL/HDL Ratio: 2
Triglycerides: 200 mg/dL — ABNORMAL HIGH (ref 0.0–149.0)
VLDL: 40 mg/dL (ref 0.0–40.0)

## 2017-06-08 LAB — CBC WITH DIFFERENTIAL/PLATELET
Basophils Absolute: 0.1 10*3/uL (ref 0.0–0.1)
Basophils Relative: 0.9 % (ref 0.0–3.0)
Eosinophils Absolute: 0.5 10*3/uL (ref 0.0–0.7)
Eosinophils Relative: 6.9 % — ABNORMAL HIGH (ref 0.0–5.0)
HCT: 39.5 % (ref 39.0–52.0)
Hemoglobin: 13.7 g/dL (ref 13.0–17.0)
Lymphocytes Relative: 30.2 % (ref 12.0–46.0)
Lymphs Abs: 2 10*3/uL (ref 0.7–4.0)
MCHC: 34.8 g/dL (ref 30.0–36.0)
MCV: 88.7 fl (ref 78.0–100.0)
Monocytes Absolute: 0.6 10*3/uL (ref 0.1–1.0)
Monocytes Relative: 8.9 % (ref 3.0–12.0)
Neutro Abs: 3.6 10*3/uL (ref 1.4–7.7)
Neutrophils Relative %: 53.1 % (ref 43.0–77.0)
Platelets: 274 10*3/uL (ref 150.0–400.0)
RBC: 4.46 Mil/uL (ref 4.22–5.81)
RDW: 12.4 % (ref 11.5–15.5)
WBC: 6.7 10*3/uL (ref 4.0–10.5)

## 2017-06-08 LAB — HEPATIC FUNCTION PANEL
ALT: 14 U/L (ref 0–53)
AST: 16 U/L (ref 0–37)
Albumin: 4.5 g/dL (ref 3.5–5.2)
Alkaline Phosphatase: 35 U/L — ABNORMAL LOW (ref 39–117)
Bilirubin, Direct: 0.1 mg/dL (ref 0.0–0.3)
Total Bilirubin: 0.4 mg/dL (ref 0.2–1.2)
Total Protein: 7.5 g/dL (ref 6.0–8.3)

## 2017-06-08 LAB — MICROALBUMIN / CREATININE URINE RATIO
Creatinine,U: 137.7 mg/dL
Microalb Creat Ratio: 0.5 mg/g (ref 0.0–30.0)
Microalb, Ur: <0.7 mg/dL (ref 0.0–1.9)

## 2017-06-08 LAB — BASIC METABOLIC PANEL
BUN: 22 mg/dL (ref 6–23)
CO2: 28 mEq/L (ref 19–32)
Calcium: 10.5 mg/dL (ref 8.4–10.5)
Chloride: 102 mEq/L (ref 96–112)
Creatinine, Ser: 1.21 mg/dL (ref 0.40–1.50)
GFR: 64.31 mL/min (ref >60.00)
Glucose, Bld: 156 mg/dL — ABNORMAL HIGH (ref 70–99)
Potassium: 4.8 mEq/L (ref 3.5–5.1)
Sodium: 137 mEq/L (ref 135–145)

## 2017-06-08 LAB — HEMOGLOBIN A1C: Hgb A1c MFr Bld: 7.8 % — ABNORMAL HIGH (ref 4.6–6.5)

## 2017-06-08 LAB — PSA: PSA: 0.46 ng/mL (ref 0.10–4.00)

## 2017-06-08 LAB — TSH: TSH: 2.21 u[IU]/mL (ref 0.35–4.50)

## 2017-06-08 MED ORDER — GLIPIZIDE ER 5 MG PO TB24: 5.0000 mg | ORAL_TABLET | Freq: Every day | ORAL | 3 refills | Status: DC

## 2017-06-08 NOTE — Patient Instructions (Addendum)
You had the Tdap (tetanus) shot today  Please continue all other medications as before, and refills have been done if requested.  Please have the pharmacy call with any other refills you may need.  Please continue your efforts at being more active, low cholesterol diet, and weight control.  You are otherwise up to date with prevention measures today.  Please keep your appointments with your specialists as you may have planned  Please go to the LAB in the Basement (turn left off the elevator) for the tests to be done today  You will be contacted by phone if any changes need to be made immediately.  Otherwise, you will receive a letter about your results with an explanation, but please check with MyChart first.  Please remember to sign up for MyChart if you have not done so, as this will be important to you in the future with finding out test results, communicating by private email, and scheduling acute appointments online when needed.  Please return in 6 months, or sooner if needed, with Lab testing done 3-5 days before  

## 2017-06-08 NOTE — Assessment & Plan Note (Signed)
stable overall by history and exam, recent data reviewed with pt, and pt to continue medical treatment as before,  to f/u any worsening symptoms or concerns, has been metformin intolerant, consider glipizide or similar for a1c > 7

## 2017-06-08 NOTE — Assessment & Plan Note (Signed)

## 2017-06-08 NOTE — Progress Notes (Signed)
Subjective:    Patient ID: [name] [name], male    DOB: 1955-12-20, 62 y.o.   MRN: [id number]  HPI  Here for wellness and f/u;  Overall doing ok;  Pt denies Chest pain, worsening SOB, DOE, wheezing, orthopnea, PND, worsening Heidinger edema, palpitations, dizziness or syncope.  Pt denies neurological change such as new headache, facial or extremity weakness.  Pt denies polydipsia, polyuria, or low sugar symptoms. Pt states overall good compliance with treatment and medications, good tolerability, and has been trying to follow appropriate diet.  Pt denies worsening depressive symptoms, suicidal ideation or panic. No fever, night sweats, wt loss, loss of appetite, or other constitutional symptoms.  Pt states good ability with ADL's, has low fall risk, home safety reviewed and adequate, no other significant changes in hearing or vision, and only occasionally active with exercise.  No other interval hx or new complaint Past Medical History:  Diagnosis Date  . CAD (coronary artery disease)    The patient presented in Aug 2008 with acute cornary syndrome. He did have a bypass surgeru. He did have a 3-vessel disease on heart cathiterization and had bypass surgery with LIMA to the LAD, sequential vein graft to the obtuse marginal - 1 and obtuse marginal -2, and vein graft to the RCA. Adenosine myoview (11/10): EF 64%, normal wall motion, normal perfusion.   . Cholelithiasis 12/07/2016  . GERD (gastroesophageal reflux disease)   . History of echocardiogram    November 2008. EF 65-70%. no regional wall abnormalities. Trivial mitral regurgitation.  [name] HTN (hypertension)   . Hyperlipidemia   . Mild anemia    Hx of post op. 8/08  . Paroxysmal atrial fibrillation (HCC)    only episode noted breifly during hospitalization for PCM placement 11/10.   . Sick sinus syndrome (HCC)    with presyncope. medtronic dual chamber PCM placed 11/10  . Type II or unspecified type diabetes mellitus without mention of complication, not  stated as uncontrolled    Past Surgical History:  Procedure Laterality Date  . CORONARY ARTERY BYPASS GRAFT    . PACEMAKER INSERTION     MDT implanted 2010  . rectal abcess     2005. Dr. Carolynne Edouard with interanal sphincterotomy    reports that he quit smoking about 11 years ago. His smoking use included cigarettes. He has a 20.00 pack-year smoking history. He has never used smokeless tobacco. He reports that he drinks about 1.2 oz of alcohol per week. He reports that he does not use drugs. Family history is unknown by patient. Allergies  Allergen Reactions  . Metformin And Related     rash   Current Outpatient Medications on File Prior to Visit  Medication Sig Dispense Refill  . amLODipine (NORVASC) 5 MG tablet TAKE 1 TABLET BY MOUTH DAILY 90 tablet 0  . aspirin 81 MG tablet Take 1 tablet (81 mg total) by mouth daily.    [name] atorvastatin (LIPITOR) 40 MG tablet TAKE 1 TABLET BY MOUTH DAILY AT 6 PM 90 tablet 0  . fenofibrate (TRICOR) 145 MG tablet TAKE 1 TABLET(145 MG) BY MOUTH DAILY 90 tablet 1  . lisinopril (PRINIVIL,ZESTRIL) 40 MG tablet TAKE 1 TABLET BY MOUTH EVERY DAY 90 tablet 3  . loratadine (CLARITIN) 10 MG tablet Take 1 tablet (10 mg total) by mouth daily. 30 tablet 2  . meloxicam (MOBIC) 15 MG tablet TAKE 1 TABLET BY MOUTH DAILY 90 tablet 3  . metoprolol (LOPRESSOR) 50 MG tablet TAKE 1 TABLET(50 MG) BY  MOUTH TWICE DAILY 180 tablet 1  . Omega-3 Fatty Acids (OMEGA-3 FISH OIL) 1200 MG CAPS Take 1 capsule by mouth 2 (two) times daily.     [name] omeprazole (PRILOSEC) 20 MG capsule TAKE 1 CAPSULE BY MOUTH TWICE DAILY 180 capsule 3   No current facility-administered medications on file prior to visit.    Review of Systems Constitutional: Negative for other unusual diaphoresis, sweats, appetite or weight changes HENT: Negative for other worsening hearing loss, ear pain, facial swelling, mouth sores or neck stiffness.   Eyes: Negative for other worsening pain, redness or other visual  disturbance.  Respiratory: Negative for other stridor or swelling Cardiovascular: Negative for other palpitations or other chest pain  Gastrointestinal: Negative for worsening diarrhea or loose stools, blood in stool, distention or other pain Genitourinary: Negative for hematuria, flank pain or other change in urine volume.  Musculoskeletal: Negative for myalgias or other joint swelling.  Skin: Negative for other color change, or other wound or worsening drainage.  Neurological: Negative for other syncope or numbness. Hematological: Negative for other adenopathy or swelling Psychiatric/Behavioral: Negative for hallucinations, other worsening agitation, SI, self-injury, or new decreased concentration All other system neg per pt    Objective:   Physical Exam BP 114/68   Pulse 82   Temp 97.8 F (36.6 C) (Oral)   Ht  (1.626 m)   Wt 161 lb (73 kg)   SpO2 96%   BMI 27.64 kg/m  VS noted,  Constitutional: Pt is oriented to person, place, and time. Appears well-developed and well-nourished, in no significant distress and comfortable Head: Normocephalic and atraumatic  Eyes: Conjunctivae and EOM are normal. Pupils are equal, round, and reactive to light Right Ear: External ear normal without discharge Left Ear: External ear normal without discharge Nose: Nose without discharge or deformity Mouth/Throat: Oropharynx is without other ulcerations and moist  Neck: Normal range of motion. Neck supple. No JVD present. No tracheal deviation present or significant neck LA or mass Cardiovascular: Normal rate, regular rhythm, normal heart sounds and intact distal pulses.   Pulmonary/Chest: WOB normal and breath sounds without rales or wheezing  Abdominal: Soft. Bowel sounds are normal. NT. No HSM  Musculoskeletal: Normal range of motion. Exhibits no edema Lymphadenopathy: Has no other cervical adenopathy.  Neurological: Pt is alert and oriented to person, place, and time. Pt has normal reflexes.  No cranial nerve deficit. Motor grossly intact, Gait intact Skin: Skin is warm and dry. No rash noted or new ulcerations Psychiatric:  Has normal mood and affect. Behavior is normal without agitation No other exam findings     Assessment & Plan:

## 2017-06-09 ENCOUNTER — Encounter: Payer: BLUE CROSS/BLUE SHIELD | Admitting: *Deleted

## 2017-06-09 ENCOUNTER — Telehealth: Payer: Self-pay

## 2017-06-09 NOTE — Telephone Encounter (Signed)
-----   Message from Corwin Levins, MD sent at 06/08/2017  7:39 PM EDT ----- Correction - ok to start glipizide ER 5 mg daily -   Shirron to please inform pt, I will do rx for this (not the metformin)

## 2017-06-09 NOTE — Telephone Encounter (Signed)
Spoke with pt and reminded pt of remote transmission that is due today. Pt verbalized understanding.   

## 2017-06-09 NOTE — Telephone Encounter (Signed)
Pt has been informed and expressed understanding.  

## 2017-06-10 ENCOUNTER — Encounter: Payer: Self-pay | Admitting: Cardiology

## 2017-06-21 ENCOUNTER — Other Ambulatory Visit: Payer: Self-pay | Admitting: Internal Medicine

## 2017-07-01 ENCOUNTER — Encounter: Payer: Self-pay | Admitting: Cardiology

## 2017-07-07 ENCOUNTER — Other Ambulatory Visit: Payer: Self-pay | Admitting: Internal Medicine

## 2017-07-07 ENCOUNTER — Ambulatory Visit (INDEPENDENT_AMBULATORY_CARE_PROVIDER_SITE_OTHER): Payer: BLUE CROSS/BLUE SHIELD | Admitting: *Deleted

## 2017-07-07 DIAGNOSIS — I495 Sick sinus syndrome: Secondary | ICD-10-CM | POA: Diagnosis not present

## 2017-07-12 ENCOUNTER — Encounter: Payer: Self-pay | Admitting: Cardiology

## 2017-07-12 NOTE — Progress Notes (Signed)
Remote pacemaker transmission.   

## 2017-07-19 LAB — CUP PACEART REMOTE DEVICE CHECK
Battery Impedance: 771 Ohm
Battery Remaining Longevity: 78 mo
Battery Voltage: 2.78 V
Brady Statistic AP VP Percent: 0 %
Brady Statistic AP VS Percent: 25 %
Brady Statistic AS VP Percent: 0 %
Brady Statistic AS VS Percent: 75 %
Date Time Interrogation Session: 20190627201003
Implantable Lead Implant Date: 20101101
Implantable Lead Implant Date: 20101101
Implantable Lead Location: 753859
Implantable Lead Location: 753860
Implantable Lead Model: 5076
Implantable Lead Model: 5076
Implantable Pulse Generator Implant Date: 20101101
Lead Channel Impedance Value: 529 Ohm
Lead Channel Impedance Value: 551 Ohm
Lead Channel Pacing Threshold Amplitude: 0.5 V
Lead Channel Pacing Threshold Amplitude: 0.75 V
Lead Channel Pacing Threshold Pulse Width: 0.4 ms
Lead Channel Pacing Threshold Pulse Width: 0.4 ms
Lead Channel Setting Pacing Amplitude: 2 V
Lead Channel Setting Pacing Amplitude: 2.5 V
Lead Channel Setting Pacing Pulse Width: 0.4 ms
Lead Channel Setting Sensing Sensitivity: 5.6 mV

## 2017-10-05 ENCOUNTER — Other Ambulatory Visit: Payer: Self-pay | Admitting: Internal Medicine

## 2017-10-10 ENCOUNTER — Ambulatory Visit (INDEPENDENT_AMBULATORY_CARE_PROVIDER_SITE_OTHER): Payer: BLUE CROSS/BLUE SHIELD | Admitting: *Deleted

## 2017-10-10 ENCOUNTER — Telehealth: Payer: Self-pay | Admitting: Cardiology

## 2017-10-10 DIAGNOSIS — I10 Essential (primary) hypertension: Secondary | ICD-10-CM

## 2017-10-10 DIAGNOSIS — I495 Sick sinus syndrome: Secondary | ICD-10-CM | POA: Diagnosis not present

## 2017-10-10 NOTE — Telephone Encounter (Signed)
LMOVM reminding pt to send remote transmission.   

## 2017-10-10 NOTE — Progress Notes (Signed)
Remote pacemaker transmission.   

## 2017-10-14 LAB — CUP PACEART REMOTE DEVICE CHECK
Battery Impedance: 874 Ohm
Battery Remaining Longevity: 72 mo
Battery Voltage: 2.78 V
Brady Statistic AP VP Percent: 0 %
Brady Statistic AP VS Percent: 26 %
Brady Statistic AS VP Percent: 0 %
Brady Statistic AS VS Percent: 73 %
Date Time Interrogation Session: 20190930190506
Implantable Lead Implant Date: 20101101
Implantable Lead Implant Date: 20101101
Implantable Lead Location: 753859
Implantable Lead Location: 753860
Implantable Lead Model: 5076
Implantable Lead Model: 5076
Implantable Pulse Generator Implant Date: 20101101
Lead Channel Impedance Value: 539 Ohm
Lead Channel Impedance Value: 548 Ohm
Lead Channel Pacing Threshold Amplitude: 0.625 V
Lead Channel Pacing Threshold Amplitude: 0.75 V
Lead Channel Pacing Threshold Pulse Width: 0.4 ms
Lead Channel Pacing Threshold Pulse Width: 0.4 ms
Lead Channel Sensing Intrinsic Amplitude: 1 mV
Lead Channel Sensing Intrinsic Amplitude: 11.2 mV
Lead Channel Setting Pacing Amplitude: 2 V
Lead Channel Setting Pacing Amplitude: 2.5 V
Lead Channel Setting Pacing Pulse Width: 0.4 ms
Lead Channel Setting Sensing Sensitivity: 5.6 mV

## 2017-10-31 ENCOUNTER — Other Ambulatory Visit: Payer: Self-pay | Admitting: Internal Medicine

## 2017-12-01 ENCOUNTER — Ambulatory Visit (INDEPENDENT_AMBULATORY_CARE_PROVIDER_SITE_OTHER): Payer: BLUE CROSS/BLUE SHIELD | Admitting: Family Medicine

## 2017-12-01 ENCOUNTER — Ambulatory Visit: Payer: Self-pay

## 2017-12-01 ENCOUNTER — Encounter: Payer: Self-pay | Admitting: Internal Medicine

## 2017-12-01 ENCOUNTER — Ambulatory Visit: Payer: BLUE CROSS/BLUE SHIELD | Admitting: Internal Medicine

## 2017-12-01 VITALS — BP 130/82 | HR 84 | Temp 98.2°F | Ht 64.0 in | Wt 162.0 lb

## 2017-12-01 DIAGNOSIS — I10 Essential (primary) hypertension: Secondary | ICD-10-CM

## 2017-12-01 DIAGNOSIS — M25511 Pain in right shoulder: Secondary | ICD-10-CM | POA: Diagnosis not present

## 2017-12-01 DIAGNOSIS — M7551 Bursitis of right shoulder: Secondary | ICD-10-CM

## 2017-12-01 DIAGNOSIS — E119 Type 2 diabetes mellitus without complications: Secondary | ICD-10-CM | POA: Diagnosis not present

## 2017-12-01 NOTE — Patient Instructions (Signed)
Please continue all other medications as before, and refills have been done if requested.  Please have the pharmacy call with any other refills you may need.  Please continue your efforts at being more active, low cholesterol diet, and weight control.  Please keep your appointments with your specialists as you may have planned     

## 2017-12-01 NOTE — Progress Notes (Signed)
[facility] [address] [place], [place] [address] Phone: [phone] Subjective:    I'm seeing this patient by the request  of:  Corwin Levins, MD   CC: Right shoulder pain  [id number]  [name] [name] is a 62 y.o. male coming in with complaint of right shoulder pain.  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  Worsening pain waking up at night.  Denies any weakness.  Repetitive activity seems to be worsening.  Rates the severity of pain is 8 out of 10.  Has tried anti-inflammatories with mild improvement though.  Denies any fevers, chills, any other abnormal weight loss      Past Medical History:  Diagnosis Date  . CAD (coronary artery disease)    The patient presented in Aug 2008 with acute cornary syndrome. He did have a bypass surgeru. He did have a 3-vessel disease on heart cathiterization and had bypass surgery with LIMA to the LAD, sequential vein graft to the obtuse marginal - 1 and obtuse marginal -2, and vein graft to the RCA. Adenosine myoview (11/10): EF 64%, normal wall motion, normal perfusion.   . Cholelithiasis 12/07/2016  . GERD (gastroesophageal reflux disease)   . History of echocardiogram    November 2008. EF 65-70%. no regional wall abnormalities. Trivial mitral regurgitation.  [name] HTN (hypertension)   . Hyperlipidemia   . Mild anemia    Hx of post op. 8/08  . Paroxysmal atrial fibrillation (HCC)    only episode noted breifly during hospitalization for PCM placement 11/10.   . Sick sinus syndrome (HCC)    with presyncope. medtronic dual chamber PCM placed 11/10  . Type II or unspecified type diabetes mellitus without mention of complication, not stated as uncontrolled    Past Surgical History:  Procedure Laterality Date  . CORONARY ARTERY BYPASS GRAFT    . PACEMAKER INSERTION     MDT implanted 2010  . rectal abcess     2005. Dr. Carolynne Edouard with interanal sphincterotomy   Social History    Socioeconomic History  . Marital status: Married    Spouse name: Not on file  . Number of children: Not on file  . Years of education: Not on file  . Highest education level: Not on file  Occupational History  . Not on file  Social Needs  . Financial resource strain: Not on file  . Food insecurity:    Worry: Not on file    Inability: Not on file  . Transportation needs:    Medical: Not on file    Non-medical: Not on file  Tobacco Use  . Smoking status: Former Smoker    Packs/day: 1.00    Years: 20.00    Pack years: 20.00    Types: Cigarettes    Last attempt to quit: 01/11/2006    Years since quitting: 11.8  . Smokeless tobacco: Never Used  Substance and Sexual Activity  . Alcohol use: Yes    Alcohol/week: 2.0 standard drinks    Types: 2 Cans of beer per week    Comment: social on weekends  . Drug use: No  . Sexual activity: Not on file  Lifestyle  . Physical activity:    Days per week: Not on file    Minutes per session: Not on file  . Stress: Not on file  Relationships  . Social connections:    Talks on phone: Not on file  Gets together: Not on file    Attends religious service: Not on file    Active member of club or organization: Not on file    Attends meetings of clubs or organizations: Not on file    Relationship status: Not on file  Other Topics Concern  . Not on file  Social History Narrative   Married, 3 children. To [place] from [place] 1979. Work- sign spray pain- graphic systems   Allergies  Allergen Reactions  . Metformin And Related     rash   Family History  Family history unknown: Yes    Current Outpatient Medications (Endocrine & Metabolic):  .  glipiZIDE (GLUCOTROL XL) 5 MG 24 hr tablet, Take 1 tablet (5 mg total) by mouth daily with breakfast.  Current Outpatient Medications (Cardiovascular):  .  amLODipine (NORVASC) 5 MG tablet, TAKE 1 TABLET BY MOUTH DAILY .  atorvastatin (LIPITOR) 40 MG tablet, TAKE 1 TABLET BY MOUTH DAILY AT 6 PM .   fenofibrate (TRICOR) 145 MG tablet, TAKE 1 TABLET(145 MG) BY MOUTH DAILY .  lisinopril (PRINIVIL,ZESTRIL) 40 MG tablet, TAKE 1 TABLET BY MOUTH EVERY DAY .  metoprolol (LOPRESSOR) 50 MG tablet, TAKE 1 TABLET(50 MG) BY MOUTH TWICE DAILY  Current Outpatient Medications (Respiratory):  .  loratadine (CLARITIN) 10 MG tablet, Take 1 tablet (10 mg total) by mouth daily.  Current Outpatient Medications (Analgesics):  .  aspirin 81 MG tablet, Take 1 tablet (81 mg total) by mouth daily. .  meloxicam (MOBIC) 15 MG tablet, TAKE 1 TABLET BY MOUTH DAILY   Current Outpatient Medications (Other):  [name].  Omega-3 Fatty Acids (OMEGA-3 FISH OIL) 1200 MG CAPS, Take 1 capsule by mouth 2 (two) times daily.  [name].  omeprazole (PRILOSEC) 20 MG capsule, TAKE 1 CAPSULE BY MOUTH TWICE DAILY    Past medical history, social, surgical and family history all reviewed in electronic medical record.  No pertanent information unless stated regarding to the chief complaint.   Review of Systems:  No headache, visual changes, nausea, vomiting, diarrhea, constipation, dizziness, abdominal pain, skin rash, fevers, chills, night sweats, weight loss, swollen lymph nodes, body aches, joint swelling,  chest pain, shortness of breath, mood changes.  Positive muscle aches  Objective     General: No apparent distress alert and oriented x3 mood and affect normal, dressed appropriately.  HEENT: Pupils equal, extraocular movements intact  Respiratory: Patient's speak in full sentences and does not appear short of breath  Cardiovascular: No lower extremity edema, non tender, no erythema  Skin: Warm dry intact with no signs of infection or rash on extremities or on axial skeleton.  Abdomen: Soft nontender  Neuro: Cranial nerves II through XII are intact, neurovascularly intact in all extremities with 2+ DTRs and 2+ pulses.  Lymph: No lymphadenopathy of posterior or anterior cervical chain or axillae bilaterally.  Gait normal with good  balance and coordination.  MSK:  Non tender with full range of motion and good stability and symmetric strength and tone of  elbows, wrist, hip, knee and ankles bilaterally.  Shoulder: Right Inspection reveals no abnormalities, atrophy or asymmetry. Palpation is normal with no tenderness over AC joint or bicipital groove. ROM is full in all planes passively. Rotator cuff strength normal throughout. signs of impingement with positive Neer and Hawkin's tests, but negative empty can sign. Speeds and Yergason's tests normal. No labral pathology noted with negative Obrien's, negative clunk and good stability. Normal scapular function observed. No painful arc and no drop arm sign. No  apprehension sign  MSK US performed of: Right This study was ordered, performed, and interpreted by Terrilee Files D.O.  Shoulder:   Supraspinatus: Partial tearing noted, Bursal bulge seen with shoulder abduction on impingement view. Infraspinatus:  Appears normal on long and transverse views. Significant increase in Doppler flow Subscapularis:  Appears normal on long and transverse views. Positive bursa Teres Minor:  Appears normal on long and transverse views. AC joint:  Capsule undistended, no geyser sign. Glenohumeral Joint:  Appears normal without effusion. Glenoid Labrum:  Intact without visualized tears. Biceps Tendon:  Appears normal on long and transverse views, no fraying of tendon, tendon located in intertubercular groove, no subluxation with shoulder internal or external rotation.  Impression: Subacromial bursitis  Procedure: Real-time Ultrasound Guided Injection of right glenohumeral joint Device: GE Logiq E  Ultrasound guided injection is preferred based studies that show increased duration, increased effect, greater accuracy, decreased procedural pain, increased response rate with ultrasound guided versus blind injection.  Verbal informed consent obtained.  Time-out conducted.  Noted no overlying  erythema, induration, or other signs of local infection.  Skin prepped in a sterile fashion.  Local anesthesia: Topical Ethyl chloride.  With sterile technique and under real time ultrasound guidance:  Joint visualized.  23g 1  inch needle inserted posterior approach. Pictures taken for needle placement. Patient did have injection of 2 cc of 1% lidocaine, 2 cc of 0.5% Marcaine, and 1.0 cc of Kenalog 40 mg/dL. Completed without difficulty  Pain immediately resolved suggesting accurate placement of the medication.  Advised to call if fevers/chills, erythema, induration, drainage, or persistent bleeding.  Images permanently stored and available for review in the ultrasound unit.  Impression: Technically successful ultrasound guided injection.  [id number]; 15 additional minutes spent for Therapeutic exercises as stated in above notes.  This included exercises focusing on stretching, strengthening, with significant focus on eccentric aspects.   Long term goals include an improvement in range of motion, strength, endurance as well as avoiding reinjury. Patient's frequency would include in 1-2 times a day, 3-5 times a week for a duration of 6-12 weeks.Shoulder Exercises that included:  Basic scapular stabilization to include adduction and depression of scapula Scaption, focusing on proper movement and good control Internal and External rotation utilizing a theraband, with elbow tucked at side entire time Rows with theraband theraband   Proper technique shown and discussed handout in great detail with ATC.  All questions were discussed and answered.     Impression and Recommendations:     This case required medical decision making of moderate complexity. The above documentation has been reviewed and is accurate and complete Judi Saa, DO       Note: This dictation was prepared with Dragon dictation along with smaller phrase technology. Any transcriptional errors that result from this process are  unintentional.

## 2017-12-01 NOTE — Assessment & Plan Note (Signed)
stable overall by history and exam, recent data reviewed with pt, and pt to continue medical treatment as before,  to f/u any worsening symptoms or concerns  

## 2017-12-01 NOTE — Assessment & Plan Note (Signed)
Patient given injection today.  Tolerated the procedure well.  Has had repetitive discomfort previously.  Patient is a diabetic and will watch blood sugars closer.  Topical anti-inflammatories given icing regimen.  Light duty at work.  Follow-up again in 4 to 6 weeks

## 2017-12-01 NOTE — Assessment & Plan Note (Signed)
Suspect rot cuff tendonitis, for sport med referral today

## 2017-12-01 NOTE — Patient Instructions (Signed)
Good to see you  Small rotator cuff tear Injected the shoulder pennsaid pinkie amount topically 2 times daily as needed.  Exercises 3 times a week.  Light duty at work for 2 weeks Ice 20 minutes 2 times daily. Usually after activity and before bed. See me again in 5-6 weeks

## 2017-12-01 NOTE — Progress Notes (Signed)
Subjective:      Patient ID: [name] [name], male    DOB: 04/27/1955, 62 y.o.   MRN: [id number]  HPI  Here with 2 mo onset gradually worsening right shoulder deep aching pain midl to mod but getting worse, and not assoc with neck pain or RUE radicular pain.  Pt is Right handed and works as Librarian, academicspray painter of signs, using arm frequently.  No RUE tingling or weakness, and bengay and salon paz not working; pain is worse at night for some reason.;  Has good ROM of the shoulder.  Pt denies chest pain, increased sob or doe, wheezing, orthopnea, PND, increased Vitelli swelling, palpitations, dizziness or syncope.    Pt denies polydipsia, polyuria Past Medical History:  Diagnosis Date  . CAD (coronary artery disease)    The patient presented in Aug 2008 with acute cornary syndrome. He did have a bypass surgeru. He did have a 3-vessel disease on heart cathiterization and had bypass surgery with LIMA to the LAD, sequential vein graft to the obtuse marginal - 1 and obtuse marginal -2, and vein graft to the RCA. Adenosine myoview (11/10): EF 64%, normal wall motion, normal perfusion.   . Cholelithiasis 12/07/2016  . GERD (gastroesophageal reflux disease)   . History of echocardiogram    November 2008. EF 65-70%. no regional wall abnormalities. Trivial mitral regurgitation.  [name]. HTN (hypertension)   . Hyperlipidemia   . Mild anemia    Hx of post op. 8/08  . Paroxysmal atrial fibrillation (HCC)    only episode noted breifly during hospitalization for PCM placement 11/10.   . Sick sinus syndrome (HCC)    with presyncope. medtronic dual chamber PCM placed 11/10  . Type II or unspecified type diabetes mellitus without mention of complication, not stated as uncontrolled    Past Surgical History:  Procedure Laterality Date  . CORONARY ARTERY BYPASS GRAFT    . PACEMAKER INSERTION     MDT implanted 2010  . rectal abcess     2005. Dr. Carolynne Edouardoth with interanal sphincterotomy    reports that he quit smoking about 11 years  ago. His smoking use included cigarettes. He has a 20.00 pack-year smoking history. He has never used smokeless tobacco. He reports that he drinks about 2.0 standard drinks of alcohol per week. He reports that he does not use drugs. Family history is unknown by patient. Allergies  Allergen Reactions  . Metformin And Related     rash   Current Outpatient Medications on File Prior to Visit  Medication Sig Dispense Refill  . amLODipine (NORVASC) 5 MG tablet TAKE 1 TABLET BY MOUTH DAILY 90 tablet 3  . aspirin 81 MG tablet Take 1 tablet (81 mg total) by mouth daily.    [name]. atorvastatin (LIPITOR) 40 MG tablet TAKE 1 TABLET BY MOUTH DAILY AT 6 PM 90 tablet 3  . fenofibrate (TRICOR) 145 MG tablet TAKE 1 TABLET(145 MG) BY MOUTH DAILY 90 tablet 0  . glipiZIDE (GLUCOTROL XL) 5 MG 24 hr tablet Take 1 tablet (5 mg total) by mouth daily with breakfast. 90 tablet 3  . lisinopril (PRINIVIL,ZESTRIL) 40 MG tablet TAKE 1 TABLET BY MOUTH EVERY DAY 90 tablet 3  . loratadine (CLARITIN) 10 MG tablet Take 1 tablet (10 mg total) by mouth daily. 30 tablet 2  . meloxicam (MOBIC) 15 MG tablet TAKE 1 TABLET BY MOUTH DAILY 90 tablet 3  . metoprolol (LOPRESSOR) 50 MG tablet TAKE 1 TABLET(50 MG) BY MOUTH TWICE DAILY 180 tablet  1  . Omega-3 Fatty Acids (OMEGA-3 FISH OIL) 1200 MG CAPS Take 1 capsule by mouth 2 (two) times daily.     [name] omeprazole (PRILOSEC) 20 MG capsule TAKE 1 CAPSULE BY MOUTH TWICE DAILY 180 capsule 1   No current facility-administered medications on file prior to visit.    Review of Systems  Constitutional: Negative for other unusual diaphoresis or sweats HENT: Negative for ear discharge or swelling Eyes: Negative for other worsening visual disturbances Respiratory: Negative for stridor or other swelling  Gastrointestinal: Negative for worsening distension or other blood Genitourinary: Negative for retention or other urinary change Musculoskeletal: Negative for other MSK pain or swelling Skin: Negative  for color change or other new lesions Neurological: Negative for worsening tremors and other numbness  Psychiatric/Behavioral: Negative for worsening agitation or other fatigue All other system neg per pt    Objective:   Physical Exam BP 130/82   Pulse 84   Temp 98.2 F (36.8 C) (Oral)   Ht 5\' 4"  (1.626 m)   Wt 162 lb (73.5 kg)   SpO2 95%   BMI 27.81 kg/m  VS noted,  Constitutional: Pt appears in NAD HENT: Head: NCAT.  Right Ear: External ear normal.  Left Ear: External ear normal.  Eyes: . Pupils are equal, round, and reactive to light. Conjunctivae and EOM are normal Nose: without d/c or deformity Neck: Neck supple. Gross normal ROM Cardiovascular: Normal rate and regular rhythm.   Pulmonary/Chest: Effort normal and breath sounds without rales or wheezing.  Right shoulder NT, with near FROM though pain with raising overhead Neurological: Pt is alert. At baseline orientation, motor grossly intact Skin: Skin is warm. No rashes, other new lesions, no Naim edema Psychiatric: Pt behavior is normal without agitation  No other exam findings Lab Results  Component Value Date   WBC 6.7 06/08/2017   HGB 13.7 06/08/2017   HCT 39.5 06/08/2017   PLT 274.0 06/08/2017   GLUCOSE 156 (H) 06/08/2017   CHOL 149 06/08/2017   TRIG 200.0 (H) 06/08/2017   HDL 61.10 06/08/2017   LDLDIRECT 79.0 12/07/2016   LDLCALC 48 06/08/2017   ALT 14 06/08/2017   AST 16 06/08/2017   NA 137 06/08/2017   K 4.8 06/08/2017   CL 102 06/08/2017   CREATININE 1.21 06/08/2017   BUN 22 06/08/2017   CO2 28 06/08/2017   TSH 2.21 06/08/2017   PSA 0.46 06/08/2017   INR 0.94 11/10/2008   HGBA1C 7.8 (H) 06/08/2017   MICROALBUR <0.7 06/08/2017       Assessment & Plan:

## 2017-12-13 ENCOUNTER — Encounter: Payer: Self-pay | Admitting: Internal Medicine

## 2017-12-13 ENCOUNTER — Ambulatory Visit: Payer: BLUE CROSS/BLUE SHIELD | Admitting: Internal Medicine

## 2017-12-13 VITALS — BP 142/78 | HR 79 | Temp 98.4°F | Ht 64.0 in | Wt 162.0 lb

## 2017-12-13 DIAGNOSIS — E119 Type 2 diabetes mellitus without complications: Secondary | ICD-10-CM | POA: Diagnosis not present

## 2017-12-13 DIAGNOSIS — I10 Essential (primary) hypertension: Secondary | ICD-10-CM

## 2017-12-13 DIAGNOSIS — E785 Hyperlipidemia, unspecified: Secondary | ICD-10-CM | POA: Diagnosis not present

## 2017-12-13 DIAGNOSIS — Z0000 Encounter for general adult medical examination without abnormal findings: Secondary | ICD-10-CM | POA: Diagnosis not present

## 2017-12-13 LAB — POCT GLYCOSYLATED HEMOGLOBIN (HGB A1C)
HbA1c POC (<> result, manual entry): 0 % (ref 4.0–5.6)
HbA1c, POC (controlled diabetic range): 0 % (ref 0.0–7.0)
HbA1c, POC (prediabetic range): 0 % — AB (ref 5.7–6.4)
Hemoglobin A1C: 5.8 % — AB (ref 4.0–5.6)

## 2017-12-13 NOTE — Assessment & Plan Note (Signed)
stable overall by history and exam, recent data reviewed with pt, and pt to continue medical treatment as before,  to f/u any worsening symptoms or concerns  

## 2017-12-13 NOTE — Patient Instructions (Addendum)
Your A1c was quite good today at 5.8, so No need for medication changes from what you are now taking  Please continue all other medications as before, and refills have been done if requested.  Please have the pharmacy call with any other refills you may need.  Please continue your efforts at being more active, low cholesterol diet, and weight control  Please keep your appointments with your specialists as you may have planned  No further lab work needed today  Please return in 6 months, or sooner if needed, with Lab testing done 3-5 days before

## 2017-12-13 NOTE — Progress Notes (Signed)
Subjective:    Patient ID: [name] MoulderHung M Chaplin, male    DOB: 04/30/1955, 62 y.o.   MRN: [id number]  HPI  Here to f/u; overall doing ok,  Pt denies chest pain, increasing sob or doe, wheezing, orthopnea, PND, increased [place] swelling, palpitations, dizziness or syncope.  Pt denies new neurological symptoms such as new headache, or facial or extremity weakness or numbness.  Pt denies polydipsia, polyuria, or low sugar episode.  Pt states overall good compliance with meds, mostly trying to follow appropriate diet, with wt overall stable,  but little exercise however Not taking the metformin due to prior intolerance,  but willing to do other if needed.  No new complaints, Right shoulder pain improved Past Medical History:  Diagnosis Date  . CAD (coronary artery disease)    The patient presented in Aug 2008 with acute cornary syndrome. He did have a bypass surgeru. He did have a 3-vessel disease on heart cathiterization and had bypass surgery with LIMA to the LAD, sequential vein graft to the obtuse marginal - 1 and obtuse marginal -2, and vein graft to the RCA. Adenosine myoview (11/10): EF 64%, normal wall motion, normal perfusion.   . Cholelithiasis 12/07/2016  . GERD (gastroesophageal reflux disease)   . History of echocardiogram    November 2008. EF 65-70%. no regional wall abnormalities. Trivial mitral regurgitation.  [name]. HTN (hypertension)   . Hyperlipidemia   . Mild anemia    Hx of post op. 8/08  . Paroxysmal atrial fibrillation (HCC)    only episode noted breifly during hospitalization for PCM placement 11/10.   . Sick sinus syndrome (HCC)    with presyncope. medtronic dual chamber PCM placed 11/10  . Type II or unspecified type diabetes mellitus without mention of complication, not stated as uncontrolled    Past Surgical History:  Procedure Laterality Date  . CORONARY ARTERY BYPASS GRAFT    . PACEMAKER INSERTION     MDT implanted 2010  . rectal abcess     2005. Dr. Carolynne Edouardoth with interanal  sphincterotomy    reports that he quit smoking about 11 years ago. His smoking use included cigarettes. He has a 20.00 pack-year smoking history. He has never used smokeless tobacco. He reports that he drinks about 2.0 standard drinks of alcohol per week. He reports that he does not use drugs. Family history is unknown by patient. Allergies  Allergen Reactions  . Metformin And Related     rash   Current Outpatient Medications on File Prior to Visit  Medication Sig Dispense Refill  . amLODipine (NORVASC) 5 MG tablet TAKE 1 TABLET BY MOUTH DAILY 90 tablet 3  . aspirin 81 MG tablet Take 1 tablet (81 mg total) by mouth daily.    [name]. atorvastatin (LIPITOR) 40 MG tablet TAKE 1 TABLET BY MOUTH DAILY AT 6 PM 90 tablet 3  . fenofibrate (TRICOR) 145 MG tablet TAKE 1 TABLET(145 MG) BY MOUTH DAILY 90 tablet 0  . glipiZIDE (GLUCOTROL XL) 5 MG 24 hr tablet Take 1 tablet (5 mg total) by mouth daily with breakfast. 90 tablet 3  . lisinopril (PRINIVIL,ZESTRIL) 40 MG tablet TAKE 1 TABLET BY MOUTH EVERY DAY 90 tablet 3  . loratadine (CLARITIN) 10 MG tablet Take 1 tablet (10 mg total) by mouth daily. 30 tablet 2  . meloxicam (MOBIC) 15 MG tablet TAKE 1 TABLET BY MOUTH DAILY 90 tablet 3  . metoprolol (LOPRESSOR) 50 MG tablet TAKE 1 TABLET(50 MG) BY MOUTH TWICE DAILY 180 tablet 1  .  Omega-3 Fatty Acids (OMEGA-3 FISH OIL) 1200 MG CAPS Take 1 capsule by mouth 2 (two) times daily.     [name] omeprazole (PRILOSEC) 20 MG capsule TAKE 1 CAPSULE BY MOUTH TWICE DAILY 180 capsule 1   No current facility-administered medications on file prior to visit.    Review of Systems  Constitutional: Negative for other unusual diaphoresis or sweats HENT: Negative for ear discharge or swelling Eyes: Negative for other worsening visual disturbances Respiratory: Negative for stridor or other swelling  Gastrointestinal: Negative for worsening distension or other blood Genitourinary: Negative for retention or other urinary  change Musculoskeletal: Negative for other MSK pain or swelling Skin: Negative for color change or other new lesions Neurological: Negative for worsening tremors and other numbness  Psychiatric/Behavioral: Negative for worsening agitation or other fatigue All other system neg per pt    Objective:   Physical Exam BP (!) 142/78   Pulse 79   Temp 98.4 F (36.9 C) (Oral)   Ht 5\' 4"  (1.626 m)   Wt 162 lb (73.5 kg)   SpO2 95%   BMI 27.81 kg/m  VS noted,  Constitutional: Pt appears in NAD HENT: Head: NCAT.  Right Ear: External ear normal.  Left Ear: External ear normal.  Eyes: . Pupils are equal, round, and reactive to light. Conjunctivae and EOM are normal Nose: without d/c or deformity Neck: Neck supple. Gross normal ROM Cardiovascular: Normal rate and regular rhythm.   Pulmonary/Chest: Effort normal and breath sounds without rales or wheezing.  Abd:  Soft, NT, ND, + BS, no organomegaly Neurological: Pt is alert. At baseline orientation, motor grossly intact Skin: Skin is warm. No rashes, other new lesions, no Irani edema Psychiatric: Pt behavior is normal without agitation  No other exam findings Lab Results  Component Value Date   WBC 6.7 06/08/2017   HGB 13.7 06/08/2017   HCT 39.5 06/08/2017   PLT 274.0 06/08/2017   GLUCOSE 156 (H) 06/08/2017   CHOL 149 06/08/2017   TRIG 200.0 (H) 06/08/2017   HDL 61.10 06/08/2017   LDLDIRECT 79.0 12/07/2016   LDLCALC 48 06/08/2017   ALT 14 06/08/2017   AST 16 06/08/2017   NA 137 06/08/2017   K 4.8 06/08/2017   CL 102 06/08/2017   CREATININE 1.21 06/08/2017   BUN 22 06/08/2017   CO2 28 06/08/2017   TSH 2.21 06/08/2017   PSA 0.46 06/08/2017   INR 0.94 11/10/2008   HGBA1C 5.8 (A) 12/13/2017   HGBA1C 0 12/13/2017   HGBA1C 0 (A) 12/13/2017   HGBA1C 0.0 12/13/2017   MICROALBUR <0.7 06/08/2017   POCT glycosylated hemoglobin (Hb A1C)  Order: [id number]  Status:  Final result Visible to patient:  No (Not Released) Dx:  Type 2  diabetes mellitus without comp...   Ref Range & Units 16:10 (12/13/17) 14mo ago (06/08/17) 74yr ago (12/07/16) 66yr ago (06/01/16) 90yr ago (12/02/15) 23yr ago (05/29/15) 33yr ago (10/31/14)  Hemoglobin A1C 4.0 - 5.6 % 5.8Abnormal   7.8High  R, CM 7.6High  R, CM 7.2High  R, CM 7.8High  R, CM 7.6High  R, CM 6.8High             Assessment & Plan:

## 2017-12-15 ENCOUNTER — Encounter: Payer: Self-pay | Admitting: Physician Assistant

## 2017-12-15 ENCOUNTER — Ambulatory Visit (INDEPENDENT_AMBULATORY_CARE_PROVIDER_SITE_OTHER): Payer: BLUE CROSS/BLUE SHIELD | Admitting: Physician Assistant

## 2017-12-15 VITALS — BP 118/60 | HR 71 | Ht 64.0 in | Wt 164.0 lb

## 2017-12-15 DIAGNOSIS — I10 Essential (primary) hypertension: Secondary | ICD-10-CM

## 2017-12-15 DIAGNOSIS — E785 Hyperlipidemia, unspecified: Secondary | ICD-10-CM

## 2017-12-15 DIAGNOSIS — I2510 Atherosclerotic heart disease of native coronary artery without angina pectoris: Secondary | ICD-10-CM

## 2017-12-15 DIAGNOSIS — Z950 Presence of cardiac pacemaker: Secondary | ICD-10-CM | POA: Diagnosis not present

## 2017-12-15 NOTE — Patient Instructions (Addendum)
Medication Instructions:  Your physician recommends that you continue on your current medications as directed. Please refer to the Current Medication list given to you today.   If you need a refill on your cardiac medications before your next appointment, please call your pharmacy.   Lab work: NONE ORDERED TODAY If you have labs (blood work) drawn today and your tests are completely normal, you will receive your results only by: [name]. MyChart Message (if you have MyChart) OR . A paper copy in the mail If you have any lab test that is abnormal or we need to change your treatment, we will call you to review the results.  Testing/Procedures: NONE ORDERED TODAY  Follow-Up: At [facility], you and your health needs are our priority.  As part of our continuing mission to provide you with exceptional heart care, we have created designated Provider Care Teams.  These Care Teams include your primary Cardiologist (physician) and Advanced Practice Providers (APPs -  Physician Assistants and Nurse Practitioners) who all work together to provide you with the care you need, when you need it. You will need a follow up appointment in 1 years.  Please call our office 2 months in advance to schedule this appointment.  You will see   Gypsy Balsamobert Krasowski, MD [name] HerrlichBrian Munley, MD . Belva Cromeajan Revankar, MD  Any Other Special Instructions Will Be Listed Below (If Applicable).

## 2017-12-15 NOTE — Progress Notes (Signed)
Cardiology Office Note:    Date:  12/15/2017   ID:  [name] [name] [birth year], MRN [id number]  PCP:  [name] Levins, MD  Cardiologist:  [name] Kendall, MD  >> will establish with MD in [facility] clinic in 1 year Electrophysiologist:  [name] Range, MD   Referring MD: [name] Levins, MD   Chief Complaint  Patient presents with  . Follow-up    CAD     History of Present Illness:    [name] [name] is a 62 y.o. male with CAD s/p CABG in 2008 (L-LAD, S-OM1/OM2, S-RCA w/ [name] [name]), symptomatic bradycardia s/p pacemaker, hypertension, hyperlipidemia, diabetes.  He had a lone episode of atrial fibrillation post pacer implant in 2010.  He was previously followed by [name] [name] and was last seen by [name] [name] in 3/18.      [name] [name] returns for follow up.  He denies chest pain, shortness of breath, syncope, orthopnea, PND or significant pedal edema.   Prior CV studies:   The following studies were reviewed today:  Nuclear stress test 11/2008 EF 64, no scar or ischemia  Cardiac catheterization 08/2006  ASSESSMENT:  1. Severe left anterior descending stenosis.  2. Moderate left circumflex stenosis.  3. Moderately severe right coronary artery stenosis.  4. Moderate global left ventricular dysfunction. >> CABG  Past Medical History:  Diagnosis Date  . CAD (coronary artery disease)    The patient presented in Aug 2008 with acute cornary syndrome. He did have a bypass surgeru. He did have a 3-vessel disease on heart cathiterization and had bypass surgery with LIMA to the LAD, sequential vein graft to the obtuse marginal - 1 and obtuse marginal -2, and vein graft to the RCA. Adenosine myoview (11/10): EF 64%, normal wall motion, normal perfusion.   . Cholelithiasis 12/07/2016  . GERD (gastroesophageal reflux disease)   . History of echocardiogram    November 2008. EF 65-70%. no regional wall abnormalities. Trivial mitral regurgitation.  [name] HTN (hypertension)   . Hyperlipidemia   . Mild anemia    Hx of  post op. 8/08  . Paroxysmal atrial fibrillation (HCC)    only episode noted breifly during hospitalization for PCM placement 11/10.   . Sick sinus syndrome (HCC)    with presyncope. medtronic dual chamber PCM placed 11/10  . Type II or unspecified type diabetes mellitus without mention of complication, not stated as uncontrolled    Surgical Hx: The patient  has a past surgical history that includes Coronary artery bypass graft; rectal abcess; and Pacemaker insertion.   Current Medications: Current Meds  Medication Sig  . amLODipine (NORVASC) 5 MG tablet TAKE 1 TABLET BY MOUTH DAILY  . aspirin 81 MG tablet Take 1 tablet (81 mg total) by mouth daily.  [name] atorvastatin (LIPITOR) 40 MG tablet TAKE 1 TABLET BY MOUTH DAILY AT 6 PM  . fenofibrate (TRICOR) 145 MG tablet TAKE 1 TABLET(145 MG) BY MOUTH DAILY  . glipiZIDE (GLUCOTROL XL) 5 MG 24 hr tablet Take 1 tablet (5 mg total) by mouth daily with breakfast.  . lisinopril (PRINIVIL,ZESTRIL) 40 MG tablet TAKE 1 TABLET BY MOUTH EVERY DAY  . loratadine (CLARITIN) 10 MG tablet Take 1 tablet (10 mg total) by mouth daily.  . meloxicam (MOBIC) 15 MG tablet TAKE 1 TABLET BY MOUTH DAILY  . metoprolol (LOPRESSOR) 50 MG tablet TAKE 1 TABLET(50 MG) BY MOUTH TWICE DAILY  . Omega-3 Fatty Acids (OMEGA-3 FISH OIL) 1200 MG CAPS Take 1 capsule by  mouth 2 (two) times daily.   [name]. omeprazole (PRILOSEC) 20 MG capsule TAKE 1 CAPSULE BY MOUTH TWICE DAILY     Allergies:   Metformin and related   Social History   Tobacco Use  . Smoking status: Former Smoker    Packs/day: 1.00    Years: 20.00    Pack years: 20.00    Types: Cigarettes    Last attempt to quit: 01/11/2006    Years since quitting: 11.9  . Smokeless tobacco: Never Used  Substance Use Topics  . Alcohol use: Yes    Alcohol/week: 2.0 standard drinks    Types: 2 Cans of beer per week    Comment: social on weekends  . Drug use: No     Family Hx: The patient's Family history is unknown by  patient.  ROS:   Please see the history of present illness.    ROS All other systems reviewed and are negative.   EKGs/Labs/Other Test Reviewed:    EKG:  EKG is   ordered today.  The ekg ordered today demonstrates A paced, HR 71, TW inversion 1, aVL, j point elevation, QTc 408, similar to old EKG  Recent Labs: 06/08/2017: ALT 14; BUN 22; Creatinine, Ser 1.21; Hemoglobin 13.7; Platelets 274.0; Potassium 4.8; Sodium 137; TSH 2.21   Recent Lipid Panel Lab Results  Component Value Date/Time   CHOL 149 06/08/2017 04:18 PM   TRIG 200.0 (H) 06/08/2017 04:18 PM   HDL 61.10 06/08/2017 04:18 PM   CHOLHDL 2 06/08/2017 04:18 PM   LDLCALC 48 06/08/2017 04:18 PM   LDLDIRECT 79.0 12/07/2016 04:34 PM    Physical Exam:    VS:  BP 118/60   Pulse 71   Ht 5\' 4"  (1.626 m)   Wt 164 lb (74.4 kg)   SpO2 93%   BMI 28.15 kg/m     Wt Readings from Last 3 Encounters:  12/15/17 164 lb (74.4 kg)  12/13/17 162 lb (73.5 kg)  12/01/17 162 lb (73.5 kg)     Physical Exam  Constitutional: He is oriented to person, place, and time. He appears well-developed and well-nourished. No distress.  HENT:  Head: Normocephalic and atraumatic.  Neck: Neck supple. No JVD present. Carotid bruit is not present.  Cardiovascular: Normal rate, regular rhythm, S1 normal and S2 normal.  No murmur heard. Pulmonary/Chest: Breath sounds normal. He has no rales.  Abdominal: Soft. There is no hepatomegaly.  Musculoskeletal: He exhibits no edema.  Neurological: He is alert and oriented to person, place, and time.  Skin: Skin is warm and dry.    ASSESSMENT & PLAN:    Coronary artery disease involving native coronary artery of native heart without angina pectoris  S/p CABG in 2008.  Nuclear stress test in 2010 was low risk.  He denies angina.  Continue ASA, statin, beta-blocker.  Given [name] [name]'s transition [facility], we discussed following up here with me and one of the MDs on my care team.  However, [facility] and would like to follow up there.  He can follow up in 1 year with one of our MDs in [place].    Essential hypertension The patient's blood pressure is controlled on his current regimen.  Continue current therapy.    Hyperlipidemia, unspecified hyperlipidemia type LDL optimal on most recent lab work.  Continue current Rx.    Pacemaker-Medtronic Follow up with EP as planned.  He may be able to transition to Dr. Elberta Fortisamnitz in [place] as well.  Dispo:  Return in about 1 year (around 12/16/2018) for Routine Follow Up in the [facility].   Medication Adjustments/Labs and Tests Ordered: Current medicines are reviewed at length with the patient today.  Concerns regarding medicines are outlined above.  Tests Ordered: Orders Placed This Encounter  Procedures  . EKG 12-Lead   Medication Changes: No orders of the defined types were placed in this encounter.   Signed, Tereso Newcomer, PA-C  12/15/2017 4:29 PM    [facility] [address] [place], [place], [place]  [address] Phone: [phone]; Fax: [phone]

## 2018-01-09 ENCOUNTER — Telehealth: Payer: Self-pay

## 2018-01-09 ENCOUNTER — Ambulatory Visit (INDEPENDENT_AMBULATORY_CARE_PROVIDER_SITE_OTHER): Payer: BLUE CROSS/BLUE SHIELD

## 2018-01-09 DIAGNOSIS — I495 Sick sinus syndrome: Secondary | ICD-10-CM

## 2018-01-09 NOTE — Telephone Encounter (Signed)
Spoke with patient to remind of missed remote transmission 

## 2018-01-10 LAB — CUP PACEART REMOTE DEVICE CHECK
Battery Impedance: 899 Ohm
Battery Remaining Longevity: 71 mo
Battery Voltage: 2.77 V
Brady Statistic AP VP Percent: 0 %
Brady Statistic AP VS Percent: 25 %
Brady Statistic AS VP Percent: 0 %
Brady Statistic AS VS Percent: 75 %
Date Time Interrogation Session: 20191230205821
Implantable Lead Implant Date: 20101101
Implantable Lead Implant Date: 20101101
Implantable Lead Location: 753859
Implantable Lead Location: 753860
Implantable Lead Model: 5076
Implantable Lead Model: 5076
Implantable Pulse Generator Implant Date: 20101101
Lead Channel Impedance Value: 524 Ohm
Lead Channel Impedance Value: 526 Ohm
Lead Channel Pacing Threshold Amplitude: 0.625 V
Lead Channel Pacing Threshold Amplitude: 0.75 V
Lead Channel Pacing Threshold Pulse Width: 0.4 ms
Lead Channel Pacing Threshold Pulse Width: 0.4 ms
Lead Channel Setting Pacing Amplitude: 2 V
Lead Channel Setting Pacing Amplitude: 2.5 V
Lead Channel Setting Pacing Pulse Width: 0.4 ms
Lead Channel Setting Sensing Sensitivity: 5.6 mV

## 2018-01-10 NOTE — Progress Notes (Signed)
Remote pacemaker transmission.   

## 2018-01-16 ENCOUNTER — Ambulatory Visit: Payer: BLUE CROSS/BLUE SHIELD | Admitting: Family Medicine

## 2018-01-29 ENCOUNTER — Other Ambulatory Visit: Payer: Self-pay | Admitting: Internal Medicine

## 2018-04-10 ENCOUNTER — Other Ambulatory Visit: Payer: Self-pay

## 2018-04-10 ENCOUNTER — Ambulatory Visit (INDEPENDENT_AMBULATORY_CARE_PROVIDER_SITE_OTHER): Payer: Self-pay | Admitting: *Deleted

## 2018-04-10 DIAGNOSIS — I495 Sick sinus syndrome: Secondary | ICD-10-CM

## 2018-04-11 ENCOUNTER — Telehealth: Payer: Self-pay

## 2018-04-11 NOTE — Telephone Encounter (Signed)
Left message for patient to remind of missed remote transmission.  

## 2018-04-12 LAB — CUP PACEART REMOTE DEVICE CHECK
Battery Impedance: 1055 Ohm
Battery Remaining Longevity: 65 mo
Battery Voltage: 2.77 V
Brady Statistic AP VP Percent: 0 %
Brady Statistic AP VS Percent: 23 %
Brady Statistic AS VP Percent: 0 %
Brady Statistic AS VS Percent: 77 %
Date Time Interrogation Session: 20200331185110
Implantable Lead Implant Date: 20101101
Implantable Lead Implant Date: 20101101
Implantable Lead Location: 753859
Implantable Lead Location: 753860
Implantable Lead Model: 5076
Implantable Lead Model: 5076
Implantable Pulse Generator Implant Date: 20101101
Lead Channel Impedance Value: 500 Ohm
Lead Channel Impedance Value: 511 Ohm
Lead Channel Pacing Threshold Amplitude: 0.625 V
Lead Channel Pacing Threshold Amplitude: 0.875 V
Lead Channel Pacing Threshold Pulse Width: 0.4 ms
Lead Channel Pacing Threshold Pulse Width: 0.4 ms
Lead Channel Setting Pacing Amplitude: 2 V
Lead Channel Setting Pacing Amplitude: 2.5 V
Lead Channel Setting Pacing Pulse Width: 0.4 ms
Lead Channel Setting Sensing Sensitivity: 4 mV

## 2018-04-18 NOTE — Progress Notes (Signed)
Remote pacemaker transmission.   

## 2018-04-29 ENCOUNTER — Other Ambulatory Visit: Payer: Self-pay | Admitting: Internal Medicine

## 2018-05-24 ENCOUNTER — Telehealth: Payer: Self-pay

## 2018-05-27 ENCOUNTER — Other Ambulatory Visit: Payer: Self-pay | Admitting: Internal Medicine

## 2018-05-31 NOTE — Telephone Encounter (Signed)
Left message regarding appt on 06/02/18. 

## 2018-06-02 ENCOUNTER — Telehealth: Payer: Self-pay | Admitting: Internal Medicine

## 2018-06-02 ENCOUNTER — Telehealth (INDEPENDENT_AMBULATORY_CARE_PROVIDER_SITE_OTHER): Payer: Self-pay | Admitting: Internal Medicine

## 2018-06-02 ENCOUNTER — Encounter: Payer: Self-pay | Admitting: Internal Medicine

## 2018-06-02 VITALS — BP 124/68 | HR 78 | Ht 64.0 in | Wt 164.0 lb

## 2018-06-02 DIAGNOSIS — I10 Essential (primary) hypertension: Secondary | ICD-10-CM

## 2018-06-02 DIAGNOSIS — I2510 Atherosclerotic heart disease of native coronary artery without angina pectoris: Secondary | ICD-10-CM

## 2018-06-02 DIAGNOSIS — I495 Sick sinus syndrome: Secondary | ICD-10-CM

## 2018-06-02 NOTE — Progress Notes (Signed)
Electrophysiology TeleHealth Note   Due to national recommendations of social distancing due to COVID 19, an audio/video telehealth visit is felt to be most appropriate for this patient at this time.  See MyChart message from today for the patient's consent to telehealth for [facility].  Facetime was used for this visit   Date:  06/02/2018   ID:  [name] [name], DOB 04/14/1955, MRN [id number]  Location: patient's home  Provider location: [address], [place]  Evaluation Performed: Follow-up visit  PCP:  Corwin LevinsJohn, Basel Defalco W, MD  Cardiologist:  Yvonne Kendallhristopher End, MD  Electrophysiologist:  Dr Johney FrameAllred  Chief Complaint:  HTN  History of Present Illness:    [name] [name] is a 63 y.o. male who presents via audio/video conferencing for a telehealth visit today.  Since last being seen in our clinic, the patient reports doing very well.  Today, he denies symptoms of palpitations, chest pain, shortness of breath,  lower extremity edema, dizziness, presyncope, or syncope.  The patient is otherwise without complaint today.  The patient denies symptoms of fevers, chills, cough, or new SOB worrisome for COVID 19.  Past Medical History:  Diagnosis Date  . CAD (coronary artery disease)    The patient presented in Aug 2008 with acute cornary syndrome. He did have a bypass surgeru. He did have a 3-vessel disease on heart cathiterization and had bypass surgery with LIMA to the LAD, sequential vein graft to the obtuse marginal - 1 and obtuse marginal -2, and vein graft to the RCA. Adenosine myoview (11/10): EF 64%, normal wall motion, normal perfusion.   . Cholelithiasis 12/07/2016  . GERD (gastroesophageal reflux disease)   . History of echocardiogram    November 2008. EF 65-70%. no regional wall abnormalities. Trivial mitral regurgitation.  [name]. HTN (hypertension)   . Hyperlipidemia   . Mild anemia    Hx of post op. 8/08  . Paroxysmal atrial fibrillation (HCC)    only episode noted breifly during  hospitalization for PCM placement 11/10.   . Sick sinus syndrome (HCC)    with presyncope. medtronic dual chamber PCM placed 11/10  . Type II or unspecified type diabetes mellitus without mention of complication, not stated as uncontrolled     Past Surgical History:  Procedure Laterality Date  . CORONARY ARTERY BYPASS GRAFT    . PACEMAKER INSERTION     MDT implanted 2010  . rectal abcess     2005. Dr. Carolynne Edouardoth with interanal sphincterotomy    Current Outpatient Medications  Medication Sig Dispense Refill  . amLODipine (NORVASC) 5 MG tablet TAKE 1 TABLET BY MOUTH DAILY 90 tablet 3  . aspirin 81 MG tablet Take 1 tablet (81 mg total) by mouth daily.    [name]. atorvastatin (LIPITOR) 40 MG tablet TAKE 1 TABLET BY MOUTH DAILY AT 6 PM 90 tablet 3  . fenofibrate (TRICOR) 145 MG tablet TAKE 1 TABLET(145 MG) BY MOUTH DAILY 90 tablet 1  . glipiZIDE (GLUCOTROL XL) 5 MG 24 hr tablet Take 1 tablet (5 mg total) by mouth daily with breakfast. 90 tablet 3  . lisinopril (ZESTRIL) 40 MG tablet TAKE 1 TABLET BY MOUTH EVERY DAY 90 tablet 3  . loratadine (CLARITIN) 10 MG tablet Take 1 tablet (10 mg total) by mouth daily. 30 tablet 2  . meloxicam (MOBIC) 15 MG tablet TAKE 1 TABLET BY MOUTH DAILY 90 tablet 3  . metoprolol (LOPRESSOR) 50 MG tablet TAKE 1 TABLET(50 MG) BY MOUTH TWICE DAILY 180  tablet 1  . Omega-3 Fatty Acids (OMEGA-3 FISH OIL) 1200 MG CAPS Take 1 capsule by mouth 2 (two) times daily.     [name] omeprazole (PRILOSEC) 20 MG capsule TAKE 1 CAPSULE BY MOUTH TWICE DAILY 180 capsule 1   No current facility-administered medications for this visit.     Allergies:   Metformin and related   Social History:  The patient  reports that he quit smoking about 12 years ago. His smoking use included cigarettes. He has a 20.00 pack-year smoking history. He has never used smokeless tobacco. He reports current alcohol use of about 2.0 standard drinks of alcohol per week. He reports that he does not use drugs.   Family  History:  +HTN  ROS:  Please see the history of present illness.   All other systems are personally reviewed and negative.    Exam:    Vital Signs:  BP 124/68   Pulse 78   Ht 5\' 4"  (1.626 m)   Wt 164 lb (74.4 kg)   BMI 28.15 kg/m   Well appearing, alert and conversant, regular work of breathing,  good skin color Eyes- anicteric, neuro- grossly intact, skin- no apparent rash or lesions or cyanosis, mouth- oral mucosa is pink   Labs/Other Tests and Data Reviewed:    Recent Labs: 06/08/2017: ALT 14; BUN 22; Creatinine, Ser 1.21; Hemoglobin 13.7; Platelets 274.0; Potassium 4.8; Sodium 137; TSH 2.21   Wt Readings from Last 3 Encounters:  06/02/18 164 lb (74.4 kg)  12/15/17 164 lb (74.4 kg)  12/13/17 162 lb (73.5 kg)     Other studies personally reviewed: Additional studies/ records that were reviewed today include: my prior notes,  [facility] notes  Review of the above records today demonstrates: as above  Last device remote is reviewed from [employer] PDF dated 04/11/2018 which reveals normal device function, no arrhythmias   ASSESSMENT & PLAN:    1.  Sinus bradycardia Remotes are uptodate Normal device function by remotes  2.HTN Stable No change required today  3. CAD No ischemic symptoms No changes today  COVID 19 screen The patient denies symptoms of COVID 19 at this time.  The importance of social distancing was discussed today.  Follow-up:  12 months with EP PA Next remote: 06/2018  Current medicines are reviewed at length with the patient today.   The patient does not have concerns regarding his medicines.  The following changes were made today:  none  Labs/ tests ordered today include:  No orders of the defined types were placed in this encounter.   Patient Risk:  after full review of this patients clinical status, I feel that they are at moderate risk at this time.  Today, I have spent 5 minutes with the patient with telehealth technology discussing  HTN .    SignedHillis Range, MD  06/02/2018 3:52 PM     [facility] [address] [place] [address] [phone] (office) [phone] (fax)

## 2018-06-02 NOTE — Telephone Encounter (Signed)
Visit already completed.

## 2018-06-02 NOTE — Telephone Encounter (Signed)
New Message    Pts son is calling and they need helping accessing his my chart account     Please call

## 2018-06-06 ENCOUNTER — Other Ambulatory Visit: Payer: Self-pay

## 2018-06-14 ENCOUNTER — Ambulatory Visit (INDEPENDENT_AMBULATORY_CARE_PROVIDER_SITE_OTHER): Payer: BC Managed Care – PPO | Admitting: Internal Medicine

## 2018-06-14 ENCOUNTER — Encounter: Payer: Self-pay | Admitting: Internal Medicine

## 2018-06-14 DIAGNOSIS — E119 Type 2 diabetes mellitus without complications: Secondary | ICD-10-CM

## 2018-06-14 DIAGNOSIS — E611 Iron deficiency: Secondary | ICD-10-CM | POA: Diagnosis not present

## 2018-06-14 DIAGNOSIS — E538 Deficiency of other specified B group vitamins: Secondary | ICD-10-CM

## 2018-06-14 DIAGNOSIS — E559 Vitamin D deficiency, unspecified: Secondary | ICD-10-CM

## 2018-06-14 DIAGNOSIS — Z0000 Encounter for general adult medical examination without abnormal findings: Secondary | ICD-10-CM

## 2018-06-14 NOTE — Progress Notes (Signed)
Patient ID: [name] [name], male   DOB: 11/10/1955, 63 y.o.   MRN: [id number]  Virtual Visit via Video Note  I connected with [name] MoulderHung M Nghiem on 06/14/18 at  3:40 PM EDT by a video enabled telemedicine application and verified that I am speaking with the correct person using two identifiers.  Location: Patient: at home Provider: at office   I discussed the limitations of evaluation and management by telemedicine and the availability of in person appointments. The patient expressed understanding and agreed to proceed.  History of Present Illness: Here for wellness and f/u;  Overall doing ok;  Pt denies Chest pain, worsening SOB, DOE, wheezing, orthopnea, PND, worsening [place] edema, palpitations, dizziness or syncope.  Pt denies neurological change such as new headache, facial or extremity weakness.  Pt denies polydipsia, polyuria, or low sugar symptoms. Pt states overall good compliance with treatment and medications, good tolerability, and has been trying to follow appropriate diet.  Pt denies worsening depressive symptoms, suicidal ideation or panic. No fever, night sweats, wt loss, loss of appetite, or other constitutional symptoms.  Pt states good ability with ADL's, has low fall risk, home safety reviewed and adequate, no other significant changes in hearing or vision, and only occasionally active with exercise.  BP 112/67, 77   No new complaints Past Medical History:  Diagnosis Date  . CAD (coronary artery disease)    The patient presented in Aug 2008 with acute cornary syndrome. He did have a bypass surgeru. He did have a 3-vessel disease on heart cathiterization and had bypass surgery with LIMA to the LAD, sequential vein graft to the obtuse marginal - 1 and obtuse marginal -2, and vein graft to the RCA. Adenosine myoview (11/10): EF 64%, normal wall motion, normal perfusion.   . Cholelithiasis 12/07/2016  . GERD (gastroesophageal reflux disease)   . History of echocardiogram    November 2008. EF 65-70%.  no regional wall abnormalities. Trivial mitral regurgitation.  [name]. HTN (hypertension)   . Hyperlipidemia   . Mild anemia    Hx of post op. 8/08  . Paroxysmal atrial fibrillation (HCC)    only episode noted breifly during hospitalization for PCM placement 11/10.   . Sick sinus syndrome (HCC)    with presyncope. medtronic dual chamber PCM placed 11/10  . Type II or unspecified type diabetes mellitus without mention of complication, not stated as uncontrolled    Past Surgical History:  Procedure Laterality Date  . CORONARY ARTERY BYPASS GRAFT    . PACEMAKER INSERTION     MDT implanted 2010  . rectal abcess     2005. Dr. Carolynne Edouardoth with interanal sphincterotomy    reports that he quit smoking about 12 years ago. His smoking use included cigarettes. He has a 20.00 pack-year smoking history. He has never used smokeless tobacco. He reports current alcohol use of about 2.0 standard drinks of alcohol per week. He reports that he does not use drugs. Family history is unknown by patient. Allergies  Allergen Reactions  . Metformin And Related     rash   Current Outpatient Medications on File Prior to Visit  Medication Sig Dispense Refill  . amLODipine (NORVASC) 5 MG tablet TAKE 1 TABLET BY MOUTH DAILY 90 tablet 3  . aspirin 81 MG tablet Take 1 tablet (81 mg total) by mouth daily.    [name]. atorvastatin (LIPITOR) 40 MG tablet TAKE 1 TABLET BY MOUTH DAILY AT 6 PM 90 tablet 3  . fenofibrate (TRICOR) 145 MG tablet TAKE  1 TABLET(145 MG) BY MOUTH DAILY 90 tablet 1  . glipiZIDE (GLUCOTROL XL) 5 MG 24 hr tablet Take 1 tablet (5 mg total) by mouth daily with breakfast. 90 tablet 3  . lisinopril (ZESTRIL) 40 MG tablet TAKE 1 TABLET BY MOUTH EVERY DAY 90 tablet 3  . loratadine (CLARITIN) 10 MG tablet Take 1 tablet (10 mg total) by mouth daily. 30 tablet 2  . meloxicam (MOBIC) 15 MG tablet TAKE 1 TABLET BY MOUTH DAILY 90 tablet 3  . metoprolol (LOPRESSOR) 50 MG tablet TAKE 1 TABLET(50 MG) BY MOUTH TWICE DAILY 180  tablet 1  . Omega-3 Fatty Acids (OMEGA-3 FISH OIL) 1200 MG CAPS Take 1 capsule by mouth 2 (two) times daily.     [name] omeprazole (PRILOSEC) 20 MG capsule TAKE 1 CAPSULE BY MOUTH TWICE DAILY 180 capsule 1   No current facility-administered medications on file prior to visit.    Observations/Objective: Alert, NAD, appropriate mood and affect, resps normal, cn 2-12 intact, moves all 4s, no visible rash or swelling Lab Results  Component Value Date   WBC 6.7 06/08/2017   HGB 13.7 06/08/2017   HCT 39.5 06/08/2017   PLT 274.0 06/08/2017   GLUCOSE 156 (H) 06/08/2017   CHOL 149 06/08/2017   TRIG 200.0 (H) 06/08/2017   HDL 61.10 06/08/2017   LDLDIRECT 79.0 12/07/2016   LDLCALC 48 06/08/2017   ALT 14 06/08/2017   AST 16 06/08/2017   NA 137 06/08/2017   K 4.8 06/08/2017   CL 102 06/08/2017   CREATININE 1.21 06/08/2017   BUN 22 06/08/2017   CO2 28 06/08/2017   TSH 2.21 06/08/2017   PSA 0.46 06/08/2017   INR 0.94 11/10/2008   HGBA1C 5.8 (A) 12/13/2017   HGBA1C 0 12/13/2017   HGBA1C 0 (A) 12/13/2017   HGBA1C 0.0 12/13/2017   MICROALBUR <0.7 06/08/2017   Assessment and Plan: See notes  Follow Up Instructions: See notes   I discussed the assessment and treatment plan with the patient. The patient was provided an opportunity to ask questions and all were answered. The patient agreed with the plan and demonstrated an understanding of the instructions.   The patient was advised to call back or seek an in-person evaluation if the symptoms worsen or if the condition fails to improve as anticipated.  Oliver Barre, MD

## 2018-06-14 NOTE — Assessment & Plan Note (Signed)

## 2018-06-14 NOTE — Assessment & Plan Note (Signed)
stable overall by history and exam, recent data reviewed with pt, and pt to continue medical treatment as before,  to f/u any worsening symptoms or concerns, for a1c with labs 

## 2018-06-14 NOTE — Patient Instructions (Signed)
Please continue all other medications as before, and refills have been done if requested.  Please have the pharmacy call with any other refills you may need.  Please continue your efforts at being more active, low cholesterol diet, and weight control.  You are otherwise up to date with prevention measures today.  Please keep your appointments with your specialists as you may have planned  Please go to the LAB in the Basement (turn left off the elevator) for the tests to be done tomorrow as you mentioned  You will be contacted by phone if any changes need to be made immediately.  Otherwise, you will receive a letter about your results with an explanation, but please check with MyChart first.  Please remember to sign up for MyChart if you have not done so, as this will be important to you in the future with finding out test results, communicating by private email, and scheduling acute appointments online when needed.  Please return in 6 months, or sooner if needed, with Lab testing done 3-5 days before  

## 2018-06-15 ENCOUNTER — Other Ambulatory Visit (INDEPENDENT_AMBULATORY_CARE_PROVIDER_SITE_OTHER): Payer: BC Managed Care – PPO

## 2018-06-15 DIAGNOSIS — Z125 Encounter for screening for malignant neoplasm of prostate: Secondary | ICD-10-CM | POA: Diagnosis not present

## 2018-06-15 DIAGNOSIS — E119 Type 2 diabetes mellitus without complications: Secondary | ICD-10-CM

## 2018-06-15 DIAGNOSIS — E538 Deficiency of other specified B group vitamins: Secondary | ICD-10-CM

## 2018-06-15 DIAGNOSIS — Z0000 Encounter for general adult medical examination without abnormal findings: Secondary | ICD-10-CM

## 2018-06-15 DIAGNOSIS — E559 Vitamin D deficiency, unspecified: Secondary | ICD-10-CM | POA: Diagnosis not present

## 2018-06-15 DIAGNOSIS — E611 Iron deficiency: Secondary | ICD-10-CM | POA: Diagnosis not present

## 2018-06-15 LAB — LIPID PANEL
Cholesterol: 178 mg/dL (ref 0–200)
HDL: 63.5 mg/dL (ref >39.00)
NonHDL: 114.9
Total CHOL/HDL Ratio: 3
Triglycerides: 232 mg/dL — ABNORMAL HIGH (ref 0.0–149.0)
VLDL: 46.4 mg/dL — ABNORMAL HIGH (ref 0.0–40.0)

## 2018-06-15 LAB — URINALYSIS, ROUTINE W REFLEX MICROSCOPIC
Bilirubin Urine: NEGATIVE
Hgb urine dipstick: NEGATIVE
Ketones, ur: NEGATIVE
Leukocytes,Ua: NEGATIVE
Nitrite: NEGATIVE
RBC / HPF: NONE SEEN (ref 0–?)
Specific Gravity, Urine: >=1.03 — AB (ref 1.000–1.030)
Total Protein, Urine: NEGATIVE
Urine Glucose: NEGATIVE
Urobilinogen, UA: 0.2 (ref 0.0–1.0)
pH: 5.5 (ref 5.0–8.0)

## 2018-06-15 LAB — CBC WITH DIFFERENTIAL/PLATELET
Basophils Absolute: 0.1 10*3/uL (ref 0.0–0.1)
Basophils Relative: 1.2 % (ref 0.0–3.0)
Eosinophils Absolute: 0.3 10*3/uL (ref 0.0–0.7)
Eosinophils Relative: 3.8 % (ref 0.0–5.0)
HCT: 40.7 % (ref 39.0–52.0)
Hemoglobin: 14.2 g/dL (ref 13.0–17.0)
Lymphocytes Relative: 20.9 % (ref 12.0–46.0)
Lymphs Abs: 1.5 10*3/uL (ref 0.7–4.0)
MCHC: 34.8 g/dL (ref 30.0–36.0)
MCV: 88.6 fl (ref 78.0–100.0)
Monocytes Absolute: 0.8 10*3/uL (ref 0.1–1.0)
Monocytes Relative: 11.8 % (ref 3.0–12.0)
Neutro Abs: 4.5 10*3/uL (ref 1.4–7.7)
Neutrophils Relative %: 62.3 % (ref 43.0–77.0)
Platelets: 319 10*3/uL (ref 150.0–400.0)
RBC: 4.59 Mil/uL (ref 4.22–5.81)
RDW: 12.3 % (ref 11.5–15.5)
WBC: 7.2 10*3/uL (ref 4.0–10.5)

## 2018-06-15 LAB — VITAMIN B12: Vitamin B-12: 406 pg/mL (ref 211–911)

## 2018-06-15 LAB — BASIC METABOLIC PANEL
BUN: 28 mg/dL — ABNORMAL HIGH (ref 6–23)
CO2: 26 mEq/L (ref 19–32)
Calcium: 10.5 mg/dL (ref 8.4–10.5)
Chloride: 103 mEq/L (ref 96–112)
Creatinine, Ser: 1.7 mg/dL — ABNORMAL HIGH (ref 0.40–1.50)
GFR: 40.74 mL/min — ABNORMAL LOW (ref >60.00)
Glucose, Bld: 173 mg/dL — ABNORMAL HIGH (ref 70–99)
Potassium: 4.5 mEq/L (ref 3.5–5.1)
Sodium: 138 mEq/L (ref 135–145)

## 2018-06-15 LAB — TSH: TSH: 1.74 u[IU]/mL (ref 0.35–4.50)

## 2018-06-15 LAB — IBC PANEL
Iron: 85 ug/dL (ref 42–165)
Saturation Ratios: 21.5 % (ref 20.0–50.0)
Transferrin: 283 mg/dL (ref 212.0–360.0)

## 2018-06-15 LAB — HEMOGLOBIN A1C: Hgb A1c MFr Bld: 6.8 % — ABNORMAL HIGH (ref 4.6–6.5)

## 2018-06-15 LAB — HEPATIC FUNCTION PANEL
ALT: 15 U/L (ref 0–53)
AST: 14 U/L (ref 0–37)
Albumin: 4.7 g/dL (ref 3.5–5.2)
Alkaline Phosphatase: 40 U/L (ref 39–117)
Bilirubin, Direct: 0.1 mg/dL (ref 0.0–0.3)
Total Bilirubin: 0.4 mg/dL (ref 0.2–1.2)
Total Protein: 7.5 g/dL (ref 6.0–8.3)

## 2018-06-15 LAB — PSA: PSA: 0.32 ng/mL (ref 0.10–4.00)

## 2018-06-15 LAB — LDL CHOLESTEROL, DIRECT: Direct LDL: 89 mg/dL

## 2018-06-15 LAB — VITAMIN D 25 HYDROXY (VIT D DEFICIENCY, FRACTURES): VITD: 32.06 ng/mL (ref 30.00–100.00)

## 2018-06-15 LAB — MICROALBUMIN / CREATININE URINE RATIO
Creatinine,U: 400.6 mg/dL
Microalb Creat Ratio: 0.7 mg/g (ref 0.0–30.0)
Microalb, Ur: 2.8 mg/dL — ABNORMAL HIGH (ref 0.0–1.9)

## 2018-06-26 ENCOUNTER — Other Ambulatory Visit: Payer: Self-pay | Admitting: *Deleted

## 2018-06-26 MED ORDER — FENOFIBRATE 145 MG PO TABS: ORAL_TABLET | ORAL | 2 refills | Status: DC

## 2018-07-10 ENCOUNTER — Ambulatory Visit (INDEPENDENT_AMBULATORY_CARE_PROVIDER_SITE_OTHER): Payer: BC Managed Care – PPO | Admitting: *Deleted

## 2018-07-10 DIAGNOSIS — I495 Sick sinus syndrome: Secondary | ICD-10-CM | POA: Diagnosis not present

## 2018-07-10 DIAGNOSIS — I4891 Unspecified atrial fibrillation: Secondary | ICD-10-CM

## 2018-07-11 ENCOUNTER — Telehealth: Payer: Self-pay

## 2018-07-11 NOTE — Telephone Encounter (Signed)
Spoke with patient to remind of missed remote transmission 

## 2018-07-12 LAB — CUP PACEART REMOTE DEVICE CHECK
Battery Impedance: 1081 Ohm
Battery Remaining Longevity: 64 mo
Battery Voltage: 2.78 V
Brady Statistic AP VP Percent: 0 %
Brady Statistic AP VS Percent: 24 %
Brady Statistic AS VP Percent: 0 %
Brady Statistic AS VS Percent: 75 %
Date Time Interrogation Session: 20200630190026
Implantable Lead Implant Date: 20101101
Implantable Lead Implant Date: 20101101
Implantable Lead Location: 753859
Implantable Lead Location: 753860
Implantable Lead Model: 5076
Implantable Lead Model: 5076
Implantable Pulse Generator Implant Date: 20101101
Lead Channel Impedance Value: 547 Ohm
Lead Channel Impedance Value: 556 Ohm
Lead Channel Pacing Threshold Amplitude: 0.5 V
Lead Channel Pacing Threshold Amplitude: 0.875 V
Lead Channel Pacing Threshold Pulse Width: 0.4 ms
Lead Channel Pacing Threshold Pulse Width: 0.4 ms
Lead Channel Setting Pacing Amplitude: 2 V
Lead Channel Setting Pacing Amplitude: 2.5 V
Lead Channel Setting Pacing Pulse Width: 0.4 ms
Lead Channel Setting Sensing Sensitivity: 5.6 mV

## 2018-07-19 NOTE — Progress Notes (Signed)
Remote pacemaker transmission.   

## 2018-10-09 ENCOUNTER — Other Ambulatory Visit: Payer: Self-pay | Admitting: Internal Medicine

## 2018-10-09 MED ORDER — OMEPRAZOLE 20 MG PO CPDR: 20.0000 mg | DELAYED_RELEASE_CAPSULE | Freq: Two times a day (BID) | ORAL | 0 refills | Status: DC

## 2018-10-09 NOTE — Telephone Encounter (Signed)
Copied from [place] [id number]. Topic: Quick Communication - Rx Refill/Question >> Oct 09, 2018  2:54 PM Mcneil, Ja-Kwan wrote: Medication: omeprazole (PRILOSEC) 20 MG capsule  Has the patient contacted their pharmacy? yes   Preferred Pharmacy (with phone number or street name): [id number] - [place], [place] - [address] AT [place] [phone] (Phone)  [phone] (Fax)  Agent: Please be advised that RX refills may take up to 3 business days. We ask that you follow-up with your pharmacy.

## 2018-10-10 ENCOUNTER — Encounter: Payer: BC Managed Care – PPO | Admitting: *Deleted

## 2018-11-10 ENCOUNTER — Telehealth: Payer: Self-pay | Admitting: Internal Medicine

## 2018-11-10 NOTE — Telephone Encounter (Signed)
° ° °  Patient wants to schedule home remote check   1. Has your device fired? NO  2. Is you device beeping? NO 3. Are you experiencing draining or swelling at device site? NO  4. Are you calling to see if we received your device transmission? NO  5. Have you passed out? NO    Please route to [place]

## 2018-11-10 NOTE — Telephone Encounter (Signed)
I spoke with the pt and rescheduled the appointment for 11/13/2018. The pt verbalized understanding.

## 2018-11-13 ENCOUNTER — Ambulatory Visit (INDEPENDENT_AMBULATORY_CARE_PROVIDER_SITE_OTHER): Payer: BC Managed Care – PPO | Admitting: *Deleted

## 2018-11-13 DIAGNOSIS — I4891 Unspecified atrial fibrillation: Secondary | ICD-10-CM | POA: Diagnosis not present

## 2018-11-13 DIAGNOSIS — I495 Sick sinus syndrome: Secondary | ICD-10-CM

## 2018-11-13 LAB — CUP PACEART REMOTE DEVICE CHECK
Battery Impedance: 1243 Ohm
Battery Remaining Longevity: 58 mo
Battery Voltage: 2.78 V
Brady Statistic AP VP Percent: 0 %
Brady Statistic AP VS Percent: 25 %
Brady Statistic AS VP Percent: 0 %
Brady Statistic AS VS Percent: 75 %
Date Time Interrogation Session: 20201102195229
Implantable Lead Implant Date: 20101101
Implantable Lead Implant Date: 20101101
Implantable Lead Location: 753859
Implantable Lead Location: 753860
Implantable Lead Model: 5076
Implantable Lead Model: 5076
Implantable Pulse Generator Implant Date: 20101101
Lead Channel Impedance Value: 532 Ohm
Lead Channel Impedance Value: 538 Ohm
Lead Channel Pacing Threshold Amplitude: 0.5 V
Lead Channel Pacing Threshold Amplitude: 0.875 V
Lead Channel Pacing Threshold Pulse Width: 0.4 ms
Lead Channel Pacing Threshold Pulse Width: 0.4 ms
Lead Channel Setting Pacing Amplitude: 2 V
Lead Channel Setting Pacing Amplitude: 2.5 V
Lead Channel Setting Pacing Pulse Width: 0.4 ms
Lead Channel Setting Sensing Sensitivity: 5.6 mV

## 2018-11-17 ENCOUNTER — Ambulatory Visit (INDEPENDENT_AMBULATORY_CARE_PROVIDER_SITE_OTHER): Payer: BC Managed Care – PPO | Admitting: Family

## 2018-11-17 ENCOUNTER — Other Ambulatory Visit: Payer: Self-pay

## 2018-11-17 ENCOUNTER — Encounter: Payer: Self-pay | Admitting: Family

## 2018-11-17 VITALS — BP 120/60 | HR 68 | Resp 18 | Ht 64.0 in | Wt 159.0 lb

## 2018-11-17 DIAGNOSIS — E785 Hyperlipidemia, unspecified: Secondary | ICD-10-CM | POA: Diagnosis not present

## 2018-11-17 DIAGNOSIS — I10 Essential (primary) hypertension: Secondary | ICD-10-CM

## 2018-11-17 DIAGNOSIS — Z950 Presence of cardiac pacemaker: Secondary | ICD-10-CM

## 2018-11-17 DIAGNOSIS — I2510 Atherosclerotic heart disease of native coronary artery without angina pectoris: Secondary | ICD-10-CM | POA: Diagnosis not present

## 2018-11-17 MED ORDER — NITROGLYCERIN 0.4 MG SL SUBL: 0.4000 mg | SUBLINGUAL_TABLET | SUBLINGUAL | 3 refills | Status: AC | PRN

## 2018-11-17 NOTE — Patient Instructions (Addendum)
Medication Instructions:   Continue your daily medications.   We will add Nitroglycerin as-needed for chest pain. If you have chest pain, sit down and place one tablet under your tongue. If chest pain continues after 5 minutes, take a 2nd tablet. If chest pain continues after another 5 minutes, call 911.  *If you need a refill on your cardiac medications before your next appointment, please call your pharmacy*  Lab Work: No labs work.   If you have labs (blood work) drawn today and your tests are completely normal, you will receive your results only by: [name] MyChart Message (if you have MyChart) OR . A paper copy in the mail If you have any lab test that is abnormal or we need to change your treatment, we will call you to review the results.  Testing/Procedures: You had an EKG today.  Follow-Up: At [facility], you and your health needs are our priority.  As part of our continuing mission to provide you with exceptional heart care, we have created designated Provider Care Teams.  These Care Teams include your primary Cardiologist (physician) and Advanced Practice Providers (APPs -  Physician Assistants and Nurse Practitioners) who all work together to provide you with the care you need, when you need it.  Your next appointment:   12 months  The format for your next appointment:   In Person  Other Instructions  Continue your good exercise routine.   Recommend low salt, heart healthy diet.    Fat and Cholesterol Restricted Eating Plan Getting too much fat and cholesterol in your diet may cause health problems. Choosing the right foods helps keep your fat and cholesterol at normal levels. This can keep you from getting certain diseases. What are tips for following this plan? Meal planning  At meals, divide your plate into four equal parts: ? Fill one-half of your plate with vegetables and green salads. ? Fill one-fourth of your plate with whole grains. ? Fill one-fourth of  your plate with low-fat (lean) protein foods.  Eat fish that is high in omega-3 fats at least two times a week. This includes mackerel, tuna, sardines, and salmon.  Eat foods that are high in fiber, such as whole grains, beans, apples, broccoli, carrots, peas, and barley. General tips   Work with your doctor to lose weight if you need to.  Avoid: ? Foods with added sugar. ? Fried foods. ? Foods with partially hydrogenated oils.  Limit alcohol intake to no more than 1 drink a day for nonpregnant women and 2 drinks a day for men. One drink equals 12 oz of beer, 5 oz of wine, or 1 oz of hard liquor. Reading food labels  Check food labels for: ? Trans fats. ? Partially hydrogenated oils. ? Saturated fat (g) in each serving. ? Cholesterol (mg) in each serving. ? Fiber (g) in each serving.  Choose foods with healthy fats, such as: ? Monounsaturated fats. ? Polyunsaturated fats. ? Omega-3 fats.  Choose grain products that have whole grains. Look for the word "whole" as the first word in the ingredient list. Cooking  Cook foods using low-fat methods. These include baking, boiling, grilling, and broiling.  Eat more home-cooked foods. Eat at restaurants and buffets less often.  Avoid cooking using saturated fats, such as butter, cream, palm oil, palm kernel oil, and coconut oil. Recommended foods  Fruits  All fresh, canned (in natural juice), or frozen fruits. Vegetables  Fresh or frozen vegetables (raw, steamed, roasted, or grilled). [place]  salads. Grains  Whole grains, such as whole wheat or whole grain breads, crackers, cereals, and pasta. Unsweetened oatmeal, bulgur, barley, quinoa, or brown rice. Corn or whole wheat flour tortillas. Meats and other protein foods  Ground beef (85% or leaner), grass-fed beef, or beef trimmed of fat. Skinless chicken or [place]. Ground chicken or [place]. Pork trimmed of fat. All fish and seafood. Egg whites. Dried beans, peas, or lentils.  Unsalted nuts or seeds. Unsalted canned beans. Nut butters without added sugar or oil. Dairy  Low-fat or nonfat dairy products, such as skim or 1% milk, 2% or reduced-fat cheeses, low-fat and fat-free ricotta or cottage cheese, or plain low-fat and nonfat yogurt. Fats and oils  Tub margarine without trans fats. Light or reduced-fat mayonnaise and salad dressings. Avocado. Olive, canola, sesame, or safflower oils. The items listed above may not be a complete list of foods and beverages you can eat. Contact a dietitian for more information. Foods to avoid Fruits  Canned fruit in heavy syrup. Fruit in cream or butter sauce. Fried fruit. Vegetables  Vegetables cooked in cheese, cream, or butter sauce. Fried vegetables. Grains  White bread. White pasta. White rice. Cornbread. Bagels, pastries, and croissants. Crackers and snack foods that contain trans fat and hydrogenated oils. Meats and other protein foods  Fatty cuts of meat. Ribs, chicken wings, bacon, sausage, bologna, salami, chitterlings, fatback, hot dogs, bratwurst, and packaged lunch meats. Liver and organ meats. Whole eggs and egg yolks. Chicken and [place] with skin. Fried meat. Dairy  Whole or 2% milk, cream, half-and-half, and cream cheese. Whole milk cheeses. Whole-fat or sweetened yogurt. Full-fat cheeses. Nondairy creamers and whipped toppings. Processed cheese, cheese spreads, and cheese curds. Beverages  Alcohol. Sugar-sweetened drinks such as sodas, lemonade, and fruit drinks. Fats and oils  Butter, stick margarine, lard, shortening, ghee, or bacon fat. Coconut, palm kernel, and palm oils. Sweets and desserts  Corn syrup, sugars, honey, and molasses. Candy. Jam and jelly. Syrup. Sweetened cereals. Cookies, pies, cakes, donuts, muffins, and ice cream. The items listed above may not be a complete list of foods and beverages you should avoid. Contact a dietitian for more information. Summary  Choosing the right foods  helps keep your fat and cholesterol at normal levels. This can keep you from getting certain diseases.  At meals, fill one-half of your plate with vegetables and green salads.  Eat high-fiber foods, like whole grains, beans, apples, carrots, peas, and barley.  Limit added sugar, saturated fats, alcohol, and fried foods. This information is not intended to replace advice given to you by your health care provider. Make sure you discuss any questions you have with your health care provider. Document Released: 06/29/2011 Document Revised: 08/31/2017 Document Reviewed: 09/14/2016 Elsevier Patient Education  2020 [employer].

## 2018-11-17 NOTE — Progress Notes (Signed)
Office Visit    Patient Name: [name] [name] Date of Encounter: 11/17/2018  Primary Care Provider:  Biagio Borg, MD Primary Cardiologist:  Nelva Bush, MD Electrophysiologist:  Thompson Grayer, MD   Chief Complaint    [name] [name] is a 63 y.o. male with a hx of CAD s/p CABG 2008 (L-LAD, S-OM1/OM2, S-RCA with Dr. Prescott Gum), symptomatic bradycardia /p PPM, HTN, HLD, DM2, lone episode afib post PPM 2010 presents today for follow up of CAD   Past Medical History    Past Medical History:  Diagnosis Date  . CAD (coronary artery disease)    The patient presented in Aug 2008 with acute cornary syndrome. He did have a bypass surgeru. He did have a 3-vessel disease on heart cathiterization and had bypass surgery with LIMA to the LAD, sequential vein graft to the obtuse marginal - 1 and obtuse marginal -2, and vein graft to the RCA. Adenosine myoview (11/10): EF 64%, normal wall motion, normal perfusion.   . Cholelithiasis 12/07/2016  . GERD (gastroesophageal reflux disease)   . History of echocardiogram    November 2008. EF 65-70%. no regional wall abnormalities. Trivial mitral regurgitation.  [name] HTN (hypertension)   . Hyperlipidemia   . Mild anemia    Hx of post op. 8/08  . Paroxysmal atrial fibrillation (HCC)    only episode noted breifly during hospitalization for PCM placement 11/10.   . Sick sinus syndrome ([place])    with presyncope. medtronic dual chamber PCM placed 11/10  . Type II or unspecified type diabetes mellitus without mention of complication, not stated as uncontrolled    Past Surgical History:  Procedure Laterality Date  . CORONARY ARTERY BYPASS GRAFT    . PACEMAKER INSERTION     MDT implanted 2010  . rectal abcess     2005. Dr. Marlou Starks with interanal sphincterotomy    Allergies  Allergies  Allergen Reactions  . Metformin And Related     rash    History of Present Illness    [name] [name] is a 63 y.o. male with a hx of CAD s/p CABG 2008 (L-LAD, S-OM1/OM2, S-RCA with  Dr. Prescott Gum), symptomatic bradycardia /p PPM, HTN, HLD, DM2, lone episode afib post PPM 2010 last seen by EP Dr. Rayann Heman 06/02/18 and general cardiology 12/15/17.  EKGs/Labs/Other Studies Reviewed:   The following studies were reviewed today:  Nuclear stress test 11/2008 EF 64, no scar or ischemia   Cardiac catheterization 08/2006  ASSESSMENT:  1. Severe left anterior descending stenosis.  2. Moderate left circumflex stenosis.  3. Moderately severe right coronary artery stenosis.  4. Moderate global left ventricular dysfunction. >> CABG  EKG:  EKG is ordered today.  The ekg ordered today demonstrates atrial paced rhythm with PR 214.   Recent Labs: 06/15/2018: ALT 15; BUN 28; Creatinine, Ser 1.70; Hemoglobin 14.2; Platelets 319.0; Potassium 4.5; Sodium 138; TSH 1.74  Recent Lipid Panel    Component Value Date/Time   CHOL 178 06/15/2018 1455   TRIG 232.0 (H) 06/15/2018 1455   HDL 63.50 06/15/2018 1455   CHOLHDL 3 06/15/2018 1455   VLDL 46.4 (H) 06/15/2018 1455   LDLCALC 48 06/08/2017 1618   LDLDIRECT 89.0 06/15/2018 1455    Home Medications   Current Meds  Medication Sig  . amLODipine (NORVASC) 5 MG tablet TAKE 1 TABLET BY MOUTH DAILY  . aspirin 81 MG tablet Take 1 tablet (81 mg total) by mouth daily.  [name] atorvastatin (LIPITOR) 40 MG tablet  TAKE 1 TABLET BY MOUTH DAILY AT 6 PM  . fenofibrate (TRICOR) 145 MG tablet TAKE 1 TABLET(145 MG) BY MOUTH DAILY  . glipiZIDE (GLUCOTROL XL) 5 MG 24 hr tablet Take 1 tablet (5 mg total) by mouth daily with breakfast.  . lisinopril (ZESTRIL) 40 MG tablet TAKE 1 TABLET BY MOUTH EVERY DAY  . meloxicam (MOBIC) 15 MG tablet TAKE 1 TABLET BY MOUTH DAILY  . metoprolol (LOPRESSOR) 50 MG tablet TAKE 1 TABLET(50 MG) BY MOUTH TWICE DAILY  . Omega-3 Fatty Acids (OMEGA-3 FISH OIL) 1200 MG CAPS Take 1 capsule by mouth 2 (two) times daily.   [name] omeprazole (PRILOSEC) 20 MG capsule Take 1 capsule (20 mg total) by mouth 2 (two) times daily.      Review of  Systems       Review of Systems  Constitution: Negative for chills, fever and malaise/fatigue.  Respiratory: Negative for cough, shortness of breath and wheezing.   Gastrointestinal: Negative for nausea and vomiting.  Neurological: Negative for dizziness, light-headedness and weakness.   All other systems reviewed and are otherwise negative except as noted above.  Physical Exam    VS:  BP 120/60 (BP Location: Left Arm, Patient Position: Sitting, Cuff Size: Normal)   Pulse 68   Resp 18   Ht 5\' 4"  (1.626 m)   Wt 159 lb (72.1 kg)   SpO2 99%   BMI 27.29 kg/m  , BMI Body mass index is 27.29 kg/m. GEN: Well nourished, well developed, in no acute distress. HEENT: normal. Neck: Supple, no JVD, carotid bruits, or masses. Cardiac: RRR, no murmurs, rubs, or gallops. No clubbing, cyanosis, edema.  Radials/DP/PT 2+ and equal bilaterally.  Respiratory:  Respirations regular and unlabored, clear to auscultation bilaterally. GI: Soft, nontender, nondistended, BS + x 4. MS: No deformity or atrophy. Skin: Warm and dry, no rash. Neuro:  Strength and sensation are intact. Psych: Normal affect.  Assessment & Plan    1. CAD s/p CABG 2008 - Stable with no anginal symptoms. Continue exercise regimen of yoga and running on treadmill. Continue low salt, heart healthy diet. GDMT aspirin, beta blocker, statin. Prescription for PRN nitroglycerin provided today.  2. HTN - BP well controlled. Continue current regimen.  3. HLD - Lipid profile 06/15/18 with total cholesterol 178, HDL 63, LDL 89, triglycerides 232. Recommend LDL goal <70. Dietary changes discussed today. Anticipates lab at upcoming PCP visit. If LDL remains above goal of 70 would recommend transition from Atorvastatin/Fenofibrate to Crestor 20mg  and Zetia 10mg  daily. 4. S/p PPM - Secondary to symptomatic bradycardia and SS. Follows with Dr. 08/15/18.  5. DM2 - Follows with PCP. If additional agent needed, recommend SGLT2i for cardioprotective  benefit.   Disposition: Follow up in 1 year(s) with Dr. , Dr. , Dr. Johney Frame, or Dr. Servando Salina at [facility].    Tomie China, NP 11/17/2018, 8:45 PM

## 2018-12-06 NOTE — Progress Notes (Signed)
Remote pacemaker transmission.   

## 2018-12-20 ENCOUNTER — Ambulatory Visit (INDEPENDENT_AMBULATORY_CARE_PROVIDER_SITE_OTHER): Payer: BC Managed Care – PPO | Admitting: Internal Medicine

## 2018-12-20 ENCOUNTER — Other Ambulatory Visit: Payer: Self-pay

## 2018-12-20 ENCOUNTER — Encounter: Payer: Self-pay | Admitting: Internal Medicine

## 2018-12-20 ENCOUNTER — Other Ambulatory Visit (INDEPENDENT_AMBULATORY_CARE_PROVIDER_SITE_OTHER): Payer: BC Managed Care – PPO

## 2018-12-20 VITALS — BP 122/60 | HR 76 | Temp 98.2°F | Ht 64.0 in | Wt 159.0 lb

## 2018-12-20 DIAGNOSIS — E611 Iron deficiency: Secondary | ICD-10-CM

## 2018-12-20 DIAGNOSIS — I10 Essential (primary) hypertension: Secondary | ICD-10-CM | POA: Diagnosis not present

## 2018-12-20 DIAGNOSIS — E538 Deficiency of other specified B group vitamins: Secondary | ICD-10-CM

## 2018-12-20 DIAGNOSIS — Z0000 Encounter for general adult medical examination without abnormal findings: Secondary | ICD-10-CM | POA: Diagnosis not present

## 2018-12-20 DIAGNOSIS — E119 Type 2 diabetes mellitus without complications: Secondary | ICD-10-CM

## 2018-12-20 DIAGNOSIS — E785 Hyperlipidemia, unspecified: Secondary | ICD-10-CM | POA: Diagnosis not present

## 2018-12-20 DIAGNOSIS — R21 Rash and other nonspecific skin eruption: Secondary | ICD-10-CM

## 2018-12-20 DIAGNOSIS — E559 Vitamin D deficiency, unspecified: Secondary | ICD-10-CM

## 2018-12-20 LAB — BASIC METABOLIC PANEL
BUN: 20 mg/dL (ref 6–23)
CO2: 28 mEq/L (ref 19–32)
Calcium: 10.3 mg/dL (ref 8.4–10.5)
Chloride: 103 mEq/L (ref 96–112)
Creatinine, Ser: 1.28 mg/dL (ref 0.40–1.50)
GFR: 56.43 mL/min — ABNORMAL LOW (ref >60.00)
Glucose, Bld: 158 mg/dL — ABNORMAL HIGH (ref 70–99)
Potassium: 4.5 mEq/L (ref 3.5–5.1)
Sodium: 137 mEq/L (ref 135–145)

## 2018-12-20 LAB — HEPATIC FUNCTION PANEL
ALT: 13 U/L (ref 0–53)
AST: 14 U/L (ref 0–37)
Albumin: 4.7 g/dL (ref 3.5–5.2)
Alkaline Phosphatase: 38 U/L — ABNORMAL LOW (ref 39–117)
Bilirubin, Direct: 0.1 mg/dL (ref 0.0–0.3)
Total Bilirubin: 0.3 mg/dL (ref 0.2–1.2)
Total Protein: 7.6 g/dL (ref 6.0–8.3)

## 2018-12-20 LAB — LIPID PANEL
Cholesterol: 162 mg/dL (ref 0–200)
HDL: 58.3 mg/dL (ref >39.00)
LDL Cholesterol: 77 mg/dL (ref 0–99)
NonHDL: 103.83
Total CHOL/HDL Ratio: 3
Triglycerides: 134 mg/dL (ref 0.0–149.0)
VLDL: 26.8 mg/dL (ref 0.0–40.0)

## 2018-12-20 MED ORDER — TRIAMCINOLONE ACETONIDE 0.1 % EX CREA: 1.0000 "application " | TOPICAL_CREAM | Freq: Two times a day (BID) | CUTANEOUS | 0 refills | Status: AC

## 2018-12-20 MED ORDER — ATORVASTATIN CALCIUM 80 MG PO TABS: 80.0000 mg | ORAL_TABLET | Freq: Every day | ORAL | 3 refills | Status: DC

## 2018-12-20 NOTE — Progress Notes (Signed)
Subjective:    Patient ID: [name] [name], male    DOB: [birth year], 63 y.o.   MRN: [id number]  HPI  Here to f/u; overall doing ok,  Pt denies chest pain, increasing sob or doe, wheezing, orthopnea, PND, increased Ihnen swelling, palpitations, dizziness or syncope.  Pt denies new neurological symptoms such as new headache, or facial or extremity weakness or numbness.  Pt denies polydipsia, polyuria, or low sugar episode.  Pt states overall good compliance with meds, mostly trying to follow appropriate diet, with wt overall stable,  but little exercise however.  Lost 5 lbs with daily treadmil.   Wt Readings from Last 3 Encounters:  12/20/18 159 lb (72.1 kg)  11/17/18 159 lb (72.1 kg)  06/02/18 164 lb (74.4 kg)  Does have an itchy rash with several scabby lesion after picking at it to the arms for several wks, mild, constant nothiing else makes better or worse Past Medical History:  Diagnosis Date  . CAD (coronary artery disease)    The patient presented in Aug 2008 with acute cornary syndrome. He did have a bypass surgeru. He did have a 3-vessel disease on heart cathiterization and had bypass surgery with LIMA to the LAD, sequential vein graft to the obtuse marginal - 1 and obtuse marginal -2, and vein graft to the RCA. Adenosine myoview (11/10): EF 64%, normal wall motion, normal perfusion.   . Cholelithiasis 12/07/2016  . GERD (gastroesophageal reflux disease)   . History of echocardiogram    November 2008. EF 65-70%. no regional wall abnormalities. Trivial mitral regurgitation.  [name] HTN (hypertension)   . Hyperlipidemia   . Mild anemia    Hx of post op. 8/08  . Paroxysmal atrial fibrillation (HCC)    only episode noted breifly during hospitalization for PCM placement 11/10.   . Sick sinus syndrome (HCC)    with presyncope. medtronic dual chamber PCM placed 11/10  . Type II or unspecified type diabetes mellitus without mention of complication, not stated as uncontrolled    Past Surgical History:   Procedure Laterality Date  . CORONARY ARTERY BYPASS GRAFT    . PACEMAKER INSERTION     MDT implanted 2010  . rectal abcess     2005. Dr. Carolynne Edouard with interanal sphincterotomy    reports that he quit smoking about 12 years ago. His smoking use included cigarettes. He has a 20.00 pack-year smoking history. He has never used smokeless tobacco. He reports current alcohol use of about 2.0 standard drinks of alcohol per week. He reports that he does not use drugs. Family history is unknown by patient. Allergies  Allergen Reactions  . Metformin And Related     rash   Current Outpatient Medications on File Prior to Visit  Medication Sig Dispense Refill  . amLODipine (NORVASC) 5 MG tablet TAKE 1 TABLET BY MOUTH DAILY 90 tablet 3  . aspirin 81 MG tablet Take 1 tablet (81 mg total) by mouth daily.    . fenofibrate (TRICOR) 145 MG tablet TAKE 1 TABLET(145 MG) BY MOUTH DAILY 90 tablet 2  . glipiZIDE (GLUCOTROL XL) 5 MG 24 hr tablet Take 1 tablet (5 mg total) by mouth daily with breakfast. 90 tablet 3  . lisinopril (ZESTRIL) 40 MG tablet TAKE 1 TABLET BY MOUTH EVERY DAY 90 tablet 3  . loratadine (CLARITIN) 10 MG tablet Take 1 tablet (10 mg total) by mouth daily. 30 tablet 2  . meloxicam (MOBIC) 15 MG tablet TAKE 1 TABLET BY MOUTH DAILY 90 tablet  3  . metoprolol (LOPRESSOR) 50 MG tablet TAKE 1 TABLET(50 MG) BY MOUTH TWICE DAILY 180 tablet 1  . nitroGLYCERIN (NITROSTAT) 0.4 MG SL tablet Place 1 tablet (0.4 mg total) under the tongue every 5 (five) minutes as needed for chest pain. 15 tablet 3  . Omega-3 Fatty Acids (OMEGA-3 FISH OIL) 1200 MG CAPS Take 1 capsule by mouth 2 (two) times daily.     [name] omeprazole (PRILOSEC) 20 MG capsule Take 1 capsule (20 mg total) by mouth 2 (two) times daily. 180 capsule 0   No current facility-administered medications on file prior to visit.    Review of Systems  Constitutional: Negative for other unusual diaphoresis or sweats HENT: Negative for ear discharge or swelling  Eyes: Negative for other worsening visual disturbances Respiratory: Negative for stridor or other swelling  Gastrointestinal: Negative for worsening distension or other blood Genitourinary: Negative for retention or other urinary change Musculoskeletal: Negative for other MSK pain or swelling Skin: Negative for color change or other new lesions Neurological: Negative for worsening tremors and other numbness  Psychiatric/Behavioral: Negative for worsening agitation or other fatigue All otherwise neg per pt    Objective:   Physical Exam BP 122/60   Pulse 76   Temp 98.2 F (36.8 C) (Oral)   Ht 5\' 4"  (1.626 m)   Wt 159 lb (72.1 kg)   SpO2 95%   BMI 27.29 kg/m  VS noted,  Constitutional: Pt appears in NAD HENT: Head: NCAT.  Right Ear: External ear normal.  Left Ear: External ear normal.  Eyes: . Pupils are equal, round, and reactive to light. Conjunctivae and EOM are normal Nose: without d/c or deformity Neck: Neck supple. Gross normal ROM Cardiovascular: Normal rate and regular rhythm.   Pulmonary/Chest: Effort normal and breath sounds without rales or wheezing.  Abd:  Soft, NT, ND, + BS, no organomegaly Neurological: Pt is alert. At baseline orientation, motor grossly intact Skin: Skin is warm. No rashes, other new lesions, no [place] edema Psychiatric: Pt behavior is normal without agitation  All otherwise neg per pt Lab Results  Component Value Date   WBC 7.2 06/15/2018   HGB 14.2 06/15/2018   HCT 40.7 06/15/2018   PLT 319.0 06/15/2018   GLUCOSE 173 (H) 06/15/2018   CHOL 178 06/15/2018   TRIG 232.0 (H) 06/15/2018   HDL 63.50 06/15/2018   LDLDIRECT 89.0 06/15/2018   LDLCALC 48 06/08/2017   ALT 15 06/15/2018   AST 14 06/15/2018   NA 138 06/15/2018   K 4.5 06/15/2018   CL 103 06/15/2018   CREATININE 1.70 (H) 06/15/2018   BUN 28 (H) 06/15/2018   CO2 26 06/15/2018   TSH 1.74 06/15/2018   PSA 0.32 06/15/2018   INR 0.94 11/10/2008   HGBA1C 6.8 (H) 06/15/2018    MICROALBUR 2.8 (H) 06/15/2018         Assessment & Plan:

## 2018-12-20 NOTE — Assessment & Plan Note (Signed)
Ok for triam cr prn,  to f/u any worsening symptoms or concerns 

## 2018-12-20 NOTE — Patient Instructions (Addendum)
Please take all new medication as prescribed - the cream  Ok to increase the lipitor to 80 mg per day  Please continue all other medications as before, and refills have been done if requested.  Please have the pharmacy call with any other refills you may need.  Please continue your efforts at being more active, low cholesterol diet, and weight control.  Please keep your appointments with your specialists as you may have planned  .Please go to the LAB in the Basement (turn left off the elevator) for the tests to be done today  You will be contacted by phone if any changes need to be made immediately.  Otherwise, you will receive a letter about your results with an explanation, but please check with MyChart first.  Please remember to sign up for MyChart if you have not done so, as this will be important to you in the future with finding out test results, communicating by private email, and scheduling acute appointments online when needed.  Please return in 6 months, or sooner if needed, with Lab testing done 3-5 days before

## 2018-12-20 NOTE — Assessment & Plan Note (Signed)
stable overall by history and exam, recent data reviewed with pt, and pt to continue medical treatment as before,  to f/u any worsening symptoms or concerns  

## 2018-12-20 NOTE — Assessment & Plan Note (Addendum)
Goal < 70, for increaesd lipitor 80 qd, o/w stable overall by history and exam, recent data reviewed with pt, and pt to continue medical treatment as before,  to f/u any worsening symptoms or concerns

## 2018-12-21 ENCOUNTER — Other Ambulatory Visit: Payer: Self-pay | Admitting: Internal Medicine

## 2018-12-21 LAB — HEMOGLOBIN A1C: Hgb A1c MFr Bld: 8 % — ABNORMAL HIGH (ref 4.6–6.5)

## 2018-12-21 MED ORDER — GLIPIZIDE ER 10 MG PO TB24: 10.0000 mg | ORAL_TABLET | Freq: Every day | ORAL | 3 refills | Status: DC

## 2019-01-15 ENCOUNTER — Other Ambulatory Visit: Payer: Self-pay | Admitting: Internal Medicine

## 2019-01-15 MED ORDER — OMEPRAZOLE 20 MG PO CPDR: 20.0000 mg | DELAYED_RELEASE_CAPSULE | Freq: Two times a day (BID) | ORAL | 0 refills | Status: DC

## 2019-01-15 NOTE — Telephone Encounter (Signed)
Medication Refill - Medication: omeprazole (PRILOSEC) 20 MG capsule [[id number]]    Preferred Pharmacy (with phone number or street name):  [id number] - [place], [place] - [address]  [address] [place]  [address]  Phone: [phone] Fax: [phone]     Agent: Please be advised that RX refills may take up to 3 business days. We ask that you follow-up with your pharmacy.

## 2019-02-12 ENCOUNTER — Ambulatory Visit (INDEPENDENT_AMBULATORY_CARE_PROVIDER_SITE_OTHER): Payer: BC Managed Care – PPO | Admitting: *Deleted

## 2019-02-12 DIAGNOSIS — I4891 Unspecified atrial fibrillation: Secondary | ICD-10-CM

## 2019-02-14 LAB — CUP PACEART REMOTE DEVICE CHECK
Battery Impedance: 1296 Ohm
Battery Remaining Longevity: 56 mo
Battery Voltage: 2.77 V
Brady Statistic AP VP Percent: 0 %
Brady Statistic AP VS Percent: 25 %
Brady Statistic AS VP Percent: 0 %
Brady Statistic AS VS Percent: 75 %
Date Time Interrogation Session: 20210202181831
Implantable Lead Implant Date: 20101031200000
Implantable Lead Implant Date: 20101031200000
Implantable Lead Location: 753859
Implantable Lead Location: 753860
Implantable Lead Model: 5076
Implantable Lead Model: 5076
Implantable Pulse Generator Implant Date: 20101031200000
Lead Channel Impedance Value: 548 Ohm
Lead Channel Impedance Value: 551 Ohm
Lead Channel Pacing Threshold Amplitude: 0.625 V
Lead Channel Pacing Threshold Amplitude: 0.875 V
Lead Channel Pacing Threshold Pulse Width: 0.4 ms
Lead Channel Pacing Threshold Pulse Width: 0.4 ms
Lead Channel Setting Pacing Amplitude: 2 V
Lead Channel Setting Pacing Amplitude: 2.5 V
Lead Channel Setting Pacing Pulse Width: 0.4 ms
Lead Channel Setting Sensing Sensitivity: 5.6 mV

## 2019-03-09 ENCOUNTER — Ambulatory Visit: Payer: BC Managed Care – PPO | Attending: Internal Medicine

## 2019-03-09 DIAGNOSIS — Z23 Encounter for immunization: Secondary | ICD-10-CM

## 2019-03-09 NOTE — Progress Notes (Signed)
   Covid-19 Vaccination Clinic  Name:  JAMIN PANTHER    MRN: [id number] DOB: February 21, 1955  03/09/2019  Mr. Terrones was observed post Covid-19 immunization for 15 minutes without incidence. He was provided with Vaccine Information Sheet and instruction to access the V-Safe system.   Mr. Hilyard was instructed to call 911 with any severe reactions post vaccine: [name] Difficulty breathing  . Swelling of your face and throat  . A fast heartbeat  . A bad rash all over your body  . Dizziness and weakness    Immunizations Administered    Name Date Dose VIS Date Route   Pfizer COVID-19 Vaccine 03/09/2019  4:12 PM 0.3 mL 12/22/2018 Intramuscular   Manufacturer: [employer], [employer]   Lot: [id number]   NDC: [address]-5

## 2019-04-04 ENCOUNTER — Ambulatory Visit: Payer: BC Managed Care – PPO | Attending: Internal Medicine

## 2019-04-04 DIAGNOSIS — Z23 Encounter for immunization: Secondary | ICD-10-CM

## 2019-04-04 NOTE — Progress Notes (Signed)
   Covid-19 Vaccination Clinic  Name:  [name] [name]    MRN: [id number] DOB: April 26, 1955  04/04/2019  Mr. [name] was observed post Covid-19 immunization for 15 minutes without incident. He was provided with Vaccine Information Sheet and instruction to access the V-Safe system.   Mr. [name] was instructed to call 911 with any severe reactions post vaccine: [name] Difficulty breathing  . Swelling of face and throat  . A fast heartbeat  . A bad rash all over body  . Dizziness and weakness   Immunizations Administered    Name Date Dose VIS Date Route   Pfizer COVID-19 Vaccine 04/04/2019  4:18 PM 0.3 mL 12/22/2018 Intramuscular   Manufacturer: [employer], [employer]   Lot: [id number]   NDC: [address]-3

## 2019-04-05 ENCOUNTER — Other Ambulatory Visit: Payer: Self-pay | Admitting: Internal Medicine

## 2019-05-14 ENCOUNTER — Ambulatory Visit (INDEPENDENT_AMBULATORY_CARE_PROVIDER_SITE_OTHER): Payer: BC Managed Care – PPO | Admitting: *Deleted

## 2019-05-14 DIAGNOSIS — I495 Sick sinus syndrome: Secondary | ICD-10-CM

## 2019-05-15 ENCOUNTER — Telehealth: Payer: Self-pay

## 2019-05-15 LAB — CUP PACEART REMOTE DEVICE CHECK
Battery Impedance: 1431 Ohm
Battery Remaining Longevity: 53 mo
Battery Voltage: 2.77 V
Brady Statistic AP VP Percent: 0 %
Brady Statistic AP VS Percent: 26 %
Brady Statistic AS VP Percent: 0 %
Brady Statistic AS VS Percent: 74 %
Date Time Interrogation Session: 20210504152015
Implantable Lead Implant Date: 20101101
Implantable Lead Implant Date: 20101101
Implantable Lead Location: 753859
Implantable Lead Location: 753860
Implantable Lead Model: 5076
Implantable Lead Model: 5076
Implantable Pulse Generator Implant Date: 20101101
Lead Channel Impedance Value: 533 Ohm
Lead Channel Impedance Value: 548 Ohm
Lead Channel Pacing Threshold Amplitude: 0.625 V
Lead Channel Pacing Threshold Amplitude: 1 V
Lead Channel Pacing Threshold Pulse Width: 0.4 ms
Lead Channel Pacing Threshold Pulse Width: 0.4 ms
Lead Channel Setting Pacing Amplitude: 2 V
Lead Channel Setting Pacing Amplitude: 2.5 V
Lead Channel Setting Pacing Pulse Width: 0.4 ms
Lead Channel Setting Sensing Sensitivity: 4 mV

## 2019-05-15 NOTE — Telephone Encounter (Signed)
Spoke with patient to remind of missed remote transmission 

## 2019-05-16 NOTE — Progress Notes (Signed)
Remote pacemaker transmission.   

## 2019-06-04 ENCOUNTER — Other Ambulatory Visit: Payer: Self-pay | Admitting: Internal Medicine

## 2019-06-04 NOTE — Telephone Encounter (Signed)
Please refill as per office routine med refill policy (all routine meds refilled for 3 mo or monthly per pt preference up to one year from last visit, then month to month grace period for 3 mo, then further med refills will have to be denied)  

## 2019-06-21 ENCOUNTER — Other Ambulatory Visit: Payer: Self-pay

## 2019-06-21 ENCOUNTER — Ambulatory Visit: Payer: BC Managed Care – PPO | Admitting: Internal Medicine

## 2019-06-21 VITALS — BP 136/72 | HR 73 | Temp 98.7°F | Ht 64.0 in | Wt 166.0 lb

## 2019-06-21 DIAGNOSIS — Z0000 Encounter for general adult medical examination without abnormal findings: Secondary | ICD-10-CM | POA: Diagnosis not present

## 2019-06-21 DIAGNOSIS — I10 Essential (primary) hypertension: Secondary | ICD-10-CM

## 2019-06-21 DIAGNOSIS — E538 Deficiency of other specified B group vitamins: Secondary | ICD-10-CM | POA: Diagnosis not present

## 2019-06-21 DIAGNOSIS — E559 Vitamin D deficiency, unspecified: Secondary | ICD-10-CM | POA: Diagnosis not present

## 2019-06-21 DIAGNOSIS — E785 Hyperlipidemia, unspecified: Secondary | ICD-10-CM

## 2019-06-21 DIAGNOSIS — Z0001 Encounter for general adult medical examination with abnormal findings: Secondary | ICD-10-CM

## 2019-06-21 DIAGNOSIS — R079 Chest pain, unspecified: Secondary | ICD-10-CM | POA: Diagnosis not present

## 2019-06-21 DIAGNOSIS — E119 Type 2 diabetes mellitus without complications: Secondary | ICD-10-CM | POA: Diagnosis not present

## 2019-06-21 DIAGNOSIS — K219 Gastro-esophageal reflux disease without esophagitis: Secondary | ICD-10-CM

## 2019-06-21 NOTE — Progress Notes (Signed)
Subjective:    Patient ID: [name] [name], male    DOB: 11-27-1955, 64 y.o.   MRN: [id number]  HPI  Here for wellness and f/u;  Overall doing ok;  Pt denies worsening SOB, DOE, wheezing, orthopnea, PND, worsening Denard edema, palpitations, dizziness or syncope, but has had 2 wks intermittent left upper chest pain sharp mild intermittent nonexertional nonpositional nonpleuritic and not assoc with diaphoresis, n/v, palps or dizziness..  Pt denies neurological change such as new headache, facial or extremity weakness.  Pt denies polydipsia, polyuria, or low sugar symptoms. Pt states overall good compliance with treatment and medications, good tolerability, and has been trying to follow appropriate diet.  Pt denies worsening depressive symptoms, suicidal ideation or panic. No fever, night sweats, wt loss, loss of appetite, or other constitutional symptoms.  Pt states good ability with ADL's, has low fall risk, home safety reviewed and adequate, no other significant changes in hearing or vision, and only occasionally active with exercise. Gained 7 lbs with covid.    Denies worsening reflux, abd pain, dysphagia, n/v, bowel change or blood. Wt Readings from Last 3 Encounters:  06/21/19 166 lb (75.3 kg)  12/20/18 159 lb (72.1 kg)  11/17/18 159 lb (72.1 kg)   Past Medical History:  Diagnosis Date  . CAD (coronary artery disease)    The patient presented in Aug 2008 with acute cornary syndrome. He did have a bypass surgeru. He did have a 3-vessel disease on heart cathiterization and had bypass surgery with LIMA to the LAD, sequential vein graft to the obtuse marginal - 1 and obtuse marginal -2, and vein graft to the RCA. Adenosine myoview (11/10): EF 64%, normal wall motion, normal perfusion.   . Cholelithiasis 12/07/2016  . GERD (gastroesophageal reflux disease)   . History of echocardiogram    November 2008. EF 65-70%. no regional wall abnormalities. Trivial mitral regurgitation.  [name] HTN (hypertension)   .  Hyperlipidemia   . Mild anemia    Hx of post op. 8/08  . Paroxysmal atrial fibrillation (HCC)    only episode noted breifly during hospitalization for PCM placement 11/10.   . Sick sinus syndrome (HCC)    with presyncope. medtronic dual chamber PCM placed 11/10  . Type II or unspecified type diabetes mellitus without mention of complication, not stated as uncontrolled    Past Surgical History:  Procedure Laterality Date  . CORONARY ARTERY BYPASS GRAFT    . PACEMAKER INSERTION     MDT implanted 2010  . rectal abcess     2005. Dr. Carolynne Edouard with interanal sphincterotomy    reports that he quit smoking about 13 years ago. His smoking use included cigarettes. He has a 20.00 pack-year smoking history. He has never used smokeless tobacco. He reports current alcohol use of about 2.0 standard drinks of alcohol per week. He reports that he does not use drugs. Family history is unknown by patient. Allergies  Allergen Reactions  . Metformin And Related     rash   Current Outpatient Medications on File Prior to Visit  Medication Sig Dispense Refill  . amLODipine (NORVASC) 5 MG tablet TAKE 1 TABLET BY MOUTH DAILY 90 tablet 3  . aspirin 81 MG tablet Take 1 tablet (81 mg total) by mouth daily.    [name] atorvastatin (LIPITOR) 40 MG tablet TAKE 1 TABLET BY MOUTH DAILY AT 6 PM 90 tablet 3  . atorvastatin (LIPITOR) 80 MG tablet Take 1 tablet (80 mg total) by mouth daily. 90 tablet 3  .  fenofibrate (TRICOR) 145 MG tablet TAKE 1 TABLET(145 MG) BY MOUTH DAILY 90 tablet 2  . glipiZIDE (GLUCOTROL XL) 10 MG 24 hr tablet Take 1 tablet (10 mg total) by mouth daily with breakfast. 90 tablet 3  . lisinopril (ZESTRIL) 40 MG tablet TAKE 1 TABLET BY MOUTH EVERY DAY 90 tablet 3  . loratadine (CLARITIN) 10 MG tablet Take 1 tablet (10 mg total) by mouth daily. 30 tablet 2  . meloxicam (MOBIC) 15 MG tablet TAKE 1 TABLET BY MOUTH DAILY 90 tablet 3  . metoprolol (LOPRESSOR) 50 MG tablet TAKE 1 TABLET(50 MG) BY MOUTH TWICE DAILY  180 tablet 1  . Omega-3 Fatty Acids (OMEGA-3 FISH OIL) 1200 MG CAPS Take 1 capsule by mouth 2 (two) times daily.     [name] omeprazole (PRILOSEC) 20 MG capsule Take 1 capsule (20 mg total) by mouth 2 (two) times daily before a meal. Annual appt due in June must see provider for future refills 180 capsule 0  . triamcinolone cream (KENALOG) 0.1 % Apply 1 application topically 2 (two) times daily. 30 g 0  . nitroGLYCERIN (NITROSTAT) 0.4 MG SL tablet Place 1 tablet (0.4 mg total) under the tongue every 5 (five) minutes as needed for chest pain. 15 tablet 3   No current facility-administered medications on file prior to visit.   Review of Systems All otherwise neg per pt     Objective:   Physical Exam BP 136/72 (BP Location: Left Arm, Patient Position: Sitting, Cuff Size: Large)   Pulse 73   Temp 98.7 F (37.1 C) (Oral)   Ht 5\' 4"  (1.626 m)   Wt 166 lb (75.3 kg)   SpO2 95%   BMI 28.49 kg/m  VS noted,  Constitutional: Pt appears in NAD HENT: Head: NCAT.  Right Ear: External ear normal.  Left Ear: External ear normal.  Eyes: . Pupils are equal, round, and reactive to light. Conjunctivae and EOM are normal Nose: without d/c or deformity Neck: Neck supple. Gross normal ROM Cardiovascular: Normal rate and regular rhythm.   Pulmonary/Chest: Effort normal and breath sounds without rales or wheezing.  Abd:  Soft, NT, ND, + BS, no organomegaly Neurological: Pt is alert. At baseline orientation, motor grossly intact Skin: Skin is warm. No rashes, other new lesions, no Dominic edema Psychiatric: Pt behavior is normal without agitation  All otherwise neg per pt Lab Results  Component Value Date   WBC 7.2 06/15/2018   HGB 14.2 06/15/2018   HCT 40.7 06/15/2018   PLT 319.0 06/15/2018   GLUCOSE 158 (H) 12/20/2018   CHOL 162 12/20/2018   TRIG 134.0 12/20/2018   HDL 58.30 12/20/2018   LDLDIRECT 89.0 06/15/2018   LDLCALC 77 12/20/2018   ALT 13 12/20/2018   AST 14 12/20/2018   NA 137 12/20/2018   K  4.5 12/20/2018   CL 103 12/20/2018   CREATININE 1.28 12/20/2018   BUN 20 12/20/2018   CO2 28 12/20/2018   TSH 1.74 06/15/2018   PSA 0.32 06/15/2018   INR 0.94 11/10/2008   HGBA1C 8.0 (H) 12/20/2018   MICROALBUR 2.8 (H) 06/15/2018      Assessment & Plan:

## 2019-06-21 NOTE — Patient Instructions (Signed)
Please continue all other medications as before, and refills have been done if requested.  Please have the pharmacy call with any other refills you may need.  Please continue your efforts at being more active, low cholesterol diet, and weight control.  You are otherwise up to date with prevention measures today.  Please keep your appointments with your specialists as you may have planned  Your blood tests were done earlier today  You will be contacted by phone if any changes need to be made immediately.  Otherwise, you will receive a letter about your results with an explanation, but please check with MyChart first.  Please remember to sign up for MyChart if you have not done so, as this will be important to you in the future with finding out test results, communicating by private email, and scheduling acute appointments online when needed.  Please make an Appointment to return in 6 months, or sooner if needed

## 2019-06-22 LAB — BASIC METABOLIC PANEL
BUN: 23 mg/dL (ref 6–23)
CO2: 31 mEq/L (ref 19–32)
Calcium: 10.1 mg/dL (ref 8.4–10.5)
Chloride: 103 mEq/L (ref 96–112)
Creatinine, Ser: 1.04 mg/dL (ref 0.40–1.50)
GFR: 71.59 mL/min (ref >60.00)
Glucose, Bld: 79 mg/dL (ref 70–99)
Potassium: 4.1 mEq/L (ref 3.5–5.1)
Sodium: 138 mEq/L (ref 135–145)

## 2019-06-22 LAB — MICROALBUMIN / CREATININE URINE RATIO
Creatinine,U: 127.6 mg/dL
Microalb Creat Ratio: 0.5 mg/g (ref 0.0–30.0)
Microalb, Ur: <0.7 mg/dL (ref 0.0–1.9)

## 2019-06-22 LAB — VITAMIN B12: Vitamin B-12: 361 pg/mL (ref 211–911)

## 2019-06-22 LAB — CBC WITH DIFFERENTIAL/PLATELET
Basophils Absolute: 0.1 10*3/uL (ref 0.0–0.1)
Basophils Relative: 1.1 % (ref 0.0–3.0)
Eosinophils Absolute: 0.4 10*3/uL (ref 0.0–0.7)
Eosinophils Relative: 5.5 % — ABNORMAL HIGH (ref 0.0–5.0)
HCT: 39.1 % (ref 39.0–52.0)
Hemoglobin: 13.2 g/dL (ref 13.0–17.0)
Lymphocytes Relative: 28.3 % (ref 12.0–46.0)
Lymphs Abs: 1.9 10*3/uL (ref 0.7–4.0)
MCHC: 33.6 g/dL (ref 30.0–36.0)
MCV: 90.3 fl (ref 78.0–100.0)
Monocytes Absolute: 0.7 10*3/uL (ref 0.1–1.0)
Monocytes Relative: 10.1 % (ref 3.0–12.0)
Neutro Abs: 3.6 10*3/uL (ref 1.4–7.7)
Neutrophils Relative %: 55 % (ref 43.0–77.0)
Platelets: 264 10*3/uL (ref 150.0–400.0)
RBC: 4.33 Mil/uL (ref 4.22–5.81)
RDW: 12.8 % (ref 11.5–15.5)
WBC: 6.6 10*3/uL (ref 4.0–10.5)

## 2019-06-22 LAB — HEPATIC FUNCTION PANEL
ALT: 14 U/L (ref 0–53)
AST: 15 U/L (ref 0–37)
Albumin: 4.8 g/dL (ref 3.5–5.2)
Alkaline Phosphatase: 33 U/L — ABNORMAL LOW (ref 39–117)
Bilirubin, Direct: 0.1 mg/dL (ref 0.0–0.3)
Total Bilirubin: 0.4 mg/dL (ref 0.2–1.2)
Total Protein: 7.3 g/dL (ref 6.0–8.3)

## 2019-06-22 LAB — LIPID PANEL
Cholesterol: 158 mg/dL (ref 0–200)
HDL: 60 mg/dL (ref >39.00)
LDL Cholesterol: 81 mg/dL (ref 0–99)
NonHDL: 98.4
Total CHOL/HDL Ratio: 3
Triglycerides: 87 mg/dL (ref 0.0–149.0)
VLDL: 17.4 mg/dL (ref 0.0–40.0)

## 2019-06-22 LAB — VITAMIN D 25 HYDROXY (VIT D DEFICIENCY, FRACTURES): VITD: 33.85 ng/mL (ref 30.00–100.00)

## 2019-06-22 LAB — URINALYSIS, ROUTINE W REFLEX MICROSCOPIC
Bilirubin Urine: NEGATIVE
Hgb urine dipstick: NEGATIVE
Ketones, ur: NEGATIVE
Leukocytes,Ua: NEGATIVE
Nitrite: NEGATIVE
RBC / HPF: NONE SEEN (ref 0–?)
Specific Gravity, Urine: 1.025 (ref 1.000–1.030)
Total Protein, Urine: NEGATIVE
Urine Glucose: NEGATIVE
Urobilinogen, UA: 0.2 (ref 0.0–1.0)
WBC, UA: NONE SEEN (ref 0–?)
pH: 6.5 (ref 5.0–8.0)

## 2019-06-22 LAB — TSH: TSH: 1.84 u[IU]/mL (ref 0.35–4.50)

## 2019-06-22 LAB — PSA: PSA: 0.31 ng/mL (ref 0.10–4.00)

## 2019-06-24 ENCOUNTER — Encounter: Payer: Self-pay | Admitting: Internal Medicine

## 2019-06-24 NOTE — Assessment & Plan Note (Addendum)
Atypical, c/w msk, declines cxr or ecg, very low suspicion for cardiac,  to f/u any worsening symptoms or concerns  I spent 31 minutes in addition to time for CPX wellness examination in preparing to see the patient by review of recent labs, imaging and procedures, obtaining and reviewing separately obtained history, communicating with the patient and family or caregiver, ordering medications, tests or procedures, and documenting clinical information in the EHR including the differential Dx, treatment, and any further evaluation and other management of chest pain, hld, htn, dm, gerd

## 2019-06-24 NOTE — Assessment & Plan Note (Signed)
stable overall by history and exam, recent data reviewed with pt, and pt to continue medical treatment as before,  to f/u any worsening symptoms or concerns  

## 2019-06-24 NOTE — Assessment & Plan Note (Signed)

## 2019-06-25 LAB — HEMOGLOBIN A1C: Hgb A1c MFr Bld: 7.4 % — ABNORMAL HIGH (ref 4.6–6.5)

## 2019-07-04 ENCOUNTER — Other Ambulatory Visit: Payer: Self-pay | Admitting: Internal Medicine

## 2019-07-04 NOTE — Telephone Encounter (Signed)
Please refill as per office routine med refill policy (all routine meds refilled for 3 mo or monthly per pt preference up to one year from last visit, then month to month grace period for 3 mo, then further med refills will have to be denied)  

## 2019-10-02 ENCOUNTER — Other Ambulatory Visit: Payer: Self-pay | Admitting: Internal Medicine

## 2019-11-14 ENCOUNTER — Ambulatory Visit (INDEPENDENT_AMBULATORY_CARE_PROVIDER_SITE_OTHER): Payer: BC Managed Care – PPO

## 2019-11-14 DIAGNOSIS — I495 Sick sinus syndrome: Secondary | ICD-10-CM | POA: Diagnosis not present

## 2019-11-16 ENCOUNTER — Ambulatory Visit: Payer: BC Managed Care – PPO | Attending: Internal Medicine

## 2019-11-16 ENCOUNTER — Other Ambulatory Visit (HOSPITAL_BASED_OUTPATIENT_CLINIC_OR_DEPARTMENT_OTHER): Payer: Self-pay | Admitting: Internal Medicine

## 2019-11-16 DIAGNOSIS — Z23 Encounter for immunization: Secondary | ICD-10-CM

## 2019-11-16 LAB — CUP PACEART REMOTE DEVICE CHECK
Battery Impedance: 1657 Ohm
Battery Remaining Longevity: 46 mo
Battery Voltage: 2.76 V
Brady Statistic AP VP Percent: 0 %
Brady Statistic AP VS Percent: 26 %
Brady Statistic AS VP Percent: 0 %
Brady Statistic AS VS Percent: 74 %
Date Time Interrogation Session: 20211104145202
Implantable Lead Implant Date: 20101101
Implantable Lead Implant Date: 20101101
Implantable Lead Location: 753859
Implantable Lead Location: 753860
Implantable Lead Model: 5076
Implantable Lead Model: 5076
Implantable Pulse Generator Implant Date: 20101101
Lead Channel Impedance Value: 540 Ohm
Lead Channel Impedance Value: 552 Ohm
Lead Channel Pacing Threshold Amplitude: 0.625 V
Lead Channel Pacing Threshold Amplitude: 0.875 V
Lead Channel Pacing Threshold Pulse Width: 0.4 ms
Lead Channel Pacing Threshold Pulse Width: 0.4 ms
Lead Channel Setting Pacing Amplitude: 2 V
Lead Channel Setting Pacing Amplitude: 2.5 V
Lead Channel Setting Pacing Pulse Width: 0.4 ms
Lead Channel Setting Sensing Sensitivity: 5.6 mV

## 2019-11-16 NOTE — Progress Notes (Signed)
   Covid-19 Vaccination Clinic  Name:  KESHON MARKOVITZ    MRN: [id number] DOB: Sep 14, 1955  11/16/2019  Mr. Lenderman was observed post Covid-19 immunization for 15 minutes without incident. He was provided with Vaccine Information Sheet and instruction to access the V-Safe system.   Mr. Bonenberger was instructed to call 911 with any severe reactions post vaccine: [name] Difficulty breathing  . Swelling of face and throat  . A fast heartbeat  . A bad rash all over body  . Dizziness and weakness

## 2019-11-19 NOTE — Progress Notes (Signed)
Remote pacemaker transmission.   

## 2019-11-22 ENCOUNTER — Encounter: Payer: Self-pay | Admitting: Internal Medicine

## 2019-11-22 ENCOUNTER — Other Ambulatory Visit: Payer: Self-pay

## 2019-11-22 ENCOUNTER — Ambulatory Visit (INDEPENDENT_AMBULATORY_CARE_PROVIDER_SITE_OTHER): Payer: BC Managed Care – PPO | Admitting: Internal Medicine

## 2019-11-22 VITALS — BP 120/68 | HR 76 | Ht 64.0 in | Wt 168.6 lb

## 2019-11-22 DIAGNOSIS — Z950 Presence of cardiac pacemaker: Secondary | ICD-10-CM

## 2019-11-22 DIAGNOSIS — I2510 Atherosclerotic heart disease of native coronary artery without angina pectoris: Secondary | ICD-10-CM | POA: Diagnosis not present

## 2019-11-22 DIAGNOSIS — I4891 Unspecified atrial fibrillation: Secondary | ICD-10-CM

## 2019-11-22 DIAGNOSIS — I495 Sick sinus syndrome: Secondary | ICD-10-CM | POA: Diagnosis not present

## 2019-11-22 DIAGNOSIS — I10 Essential (primary) hypertension: Secondary | ICD-10-CM | POA: Diagnosis not present

## 2019-11-22 MED ORDER — METOPROLOL TARTRATE 25 MG PO TABS
25.0000 mg | ORAL_TABLET | Freq: Two times a day (BID) | ORAL | 3 refills | Status: DC
Start: 2019-11-22 — End: 2020-06-02

## 2019-11-22 MED FILL — PFIZER-BIONTECH COVID-19 VA: 30 | 1 days supply | Qty: 0 | Fill #0

## 2019-11-22 NOTE — Progress Notes (Signed)
PCP: Corwin Levins, MD Primary Cardiologist: Dr End Primary EP:  Dr Ella Bodo [name] [name] is a 64 y.o. male who presents today for routine electrophysiology followup.  Since last being seen in our clinic, the patient reports doing very well.  With exercise, he has occasional bilateral calf pain after 15 minutes with resolves with rest.  Today, he denies symptoms of palpitations, chest pain, shortness of breath,  lower extremity edema, dizziness, presyncope, or syncope.  The patient is otherwise without complaint today.   Past Medical History:  Diagnosis Date  . CAD (coronary artery disease)    The patient presented in Aug 2008 with acute cornary syndrome. He did have a bypass surgeru. He did have a 3-vessel disease on heart cathiterization and had bypass surgery with LIMA to the LAD, sequential vein graft to the obtuse marginal - 1 and obtuse marginal -2, and vein graft to the RCA. Adenosine myoview (11/10): EF 64%, normal wall motion, normal perfusion.   . Cholelithiasis 12/07/2016  . GERD (gastroesophageal reflux disease)   . History of echocardiogram    November 2008. EF 65-70%. no regional wall abnormalities. Trivial mitral regurgitation.  [name] HTN (hypertension)   . Hyperlipidemia   . Mild anemia    Hx of post op. 8/08  . Paroxysmal atrial fibrillation (HCC)    only episode noted breifly during hospitalization for PCM placement 11/10.   . Sick sinus syndrome (HCC)    with presyncope. medtronic dual chamber PCM placed 11/10  . Type II or unspecified type diabetes mellitus without mention of complication, not stated as uncontrolled    Past Surgical History:  Procedure Laterality Date  . CORONARY ARTERY BYPASS GRAFT    . PACEMAKER INSERTION     MDT implanted 2010  . rectal abcess     2005. Dr. Carolynne Edouard with interanal sphincterotomy    ROS- all systems are reviewed and negative except as per HPI above  Current Outpatient Medications  Medication Sig Dispense Refill  . amLODipine  (NORVASC) 5 MG tablet TAKE 1 TABLET BY MOUTH DAILY 90 tablet 3  . aspirin 81 MG tablet Take 1 tablet (81 mg total) by mouth daily.    [name] atorvastatin (LIPITOR) 40 MG tablet TAKE 1 TABLET BY MOUTH DAILY AT 6 PM 90 tablet 3  . atorvastatin (LIPITOR) 80 MG tablet Take 1 tablet (80 mg total) by mouth daily. 90 tablet 3  . fenofibrate (TRICOR) 145 MG tablet TAKE 1 TABLET(145 MG) BY MOUTH DAILY 90 tablet 2  . glipiZIDE (GLUCOTROL XL) 10 MG 24 hr tablet Take 1 tablet (10 mg total) by mouth daily with breakfast. 90 tablet 3  . lisinopril (ZESTRIL) 40 MG tablet TAKE 1 TABLET BY MOUTH EVERY DAY 90 tablet 3  . loratadine (CLARITIN) 10 MG tablet Take 1 tablet (10 mg total) by mouth daily. 30 tablet 2  . meloxicam (MOBIC) 15 MG tablet TAKE 1 TABLET BY MOUTH DAILY 90 tablet 3  . metoprolol (LOPRESSOR) 50 MG tablet TAKE 1 TABLET(50 MG) BY MOUTH TWICE DAILY 180 tablet 1  . nitroGLYCERIN (NITROSTAT) 0.4 MG SL tablet Place 1 tablet (0.4 mg total) under the tongue every 5 (five) minutes as needed for chest pain. 15 tablet 3  . Omega-3 Fatty Acids (OMEGA-3 FISH OIL) 1200 MG CAPS Take 1 capsule by mouth 2 (two) times daily.     [name] omeprazole (PRILOSEC) 20 MG capsule TAKE ONE CAPSULE BY MOUTH TWICE DAILY BEFORE A MEAL 180 capsule 0  .  triamcinolone cream (KENALOG) 0.1 % Apply 1 application topically 2 (two) times daily. 30 g 0   No current facility-administered medications for this visit.    Physical Exam: Vitals:   11/22/19 1608  BP: 120/68  Pulse: 76  SpO2: 98%  Weight: 168 lb 9.6 oz (76.5 kg)  Height: 5\' 4"  (1.626 m)    GEN- The patient is well appearing, alert and oriented x 3 today.   Head- normocephalic, atraumatic Eyes-  Sclera clear, conjunctiva pink Ears- hearing intact Oropharynx- clear Lungs- Clear to ausculation bilaterally, normal work of breathing Chest- pacemaker pocket is well healed Heart- Regular rate and rhythm, no murmurs, rubs or gallops, PMI not laterally displaced GI- soft, NT, ND,  + BS Extremities- no clubbing, cyanosis, or edema 1+ DP/PT pulses bilaterally  Pacemaker interrogation- reviewed in detail today,  See PACEART report  ekg tracing ordered today is personally reviewed and shows atrial paced rhythm, PR 208 msec  Assessment and Plan:  1. Symptomatic sinus bradycardia  Normal pacemaker function See Pace Art report No changes today he is not device dependant today  2. HTN Stable No change required today  3. CAD No ischemic symptoms Reduce metoprolol to 25mg  BID  4. HL Continue lipitor/ tricor  5. afib  Very brief episode after PPM implant afib burden is <0.1% and episodes are very short chads2vasc score is 4 Defer OAC for increased AF burden  6. Exertional calf pain We will obtain bilateral lower extremity arterial dopplers and noninvasive testing for PAD  Risks, benefits and potential toxicities for medications prescribed and/or refilled reviewed with patient today.   Return to see EP PA in a year  MD, [facility] 11/22/2019 4:26 PM

## 2019-11-22 NOTE — Patient Instructions (Addendum)
Medication Instructions:  Reduce your Metoprolol Tartrate from 50 mg to 25 mg twice a day  *If you need a refill on your cardiac medications before your next appointment, please call your pharmacy*  Lab Work: None ordered.  If you have labs (blood work) drawn today and your tests are completely normal, you will receive your results only by: [name] MyChart Message (if you have MyChart) OR . A paper copy in the mail If you have any lab test that is abnormal or we need to change your treatment, we will call you to review the results.  Testing/Procedures: Please schedule ABI and Dopplers Your physician has requested that you have an ankle brachial index (ABI). During this test an ultrasound and blood pressure cuff are used to evaluate the arteries that supply the arms and legs with blood. Allow thirty minutes for this exam. There are no restrictions or special instructions.  Your physician has requested that you have a lower or upper extremity arterial duplex. This test is an ultrasound of the arteries in the legs . There are no restrictions or special instructions    Follow-Up: At [facility], you and your health needs are our priority.  As part of our continuing mission to provide you with exceptional heart care, we have created designated Provider Care Teams.  These Care Teams include your primary Cardiologist (physician) and Advanced Practice Providers (APPs -  Physician Assistants and Nurse Practitioners) who all work together to provide you with the care you need, when you need it.  We recommend signing up for the patient portal called "MyChart".  Sign up information is provided on this After Visit Summary.  MyChart is used to connect with patients for Virtual Visits (Telemedicine).  Patients are able to view lab/test results, encounter notes, upcoming appointments, etc.  Non-urgent messages can be sent to your provider as well.   To learn more about what you can do with MyChart, go to  [email or web address].    Your next appointment:   Your physician wants you to follow-up in: 1 year with [name] [name].  You will receive a reminder letter in the mail two months in advance. If you don't receive a letter, please call our office to schedule the follow-up appointment.  Remote monitoring is used to monitor your Pacemaker from home. This monitoring reduces the number of office visits required to check your device to one time per year. It allows [place] to keep an eye on the functioning of your device to ensure it is working properly. You are scheduled for a device check from home on 02/13/20. You may send your transmission at any time that day. If you have a wireless device, the transmission will be sent automatically. After your physician reviews your transmission, you will receive a postcard with your next transmission date.  Other Instructions:

## 2019-11-26 ENCOUNTER — Other Ambulatory Visit: Payer: Self-pay | Admitting: Internal Medicine

## 2019-11-26 DIAGNOSIS — I2510 Atherosclerotic heart disease of native coronary artery without angina pectoris: Secondary | ICD-10-CM

## 2019-11-26 DIAGNOSIS — I4891 Unspecified atrial fibrillation: Secondary | ICD-10-CM

## 2019-11-26 DIAGNOSIS — I739 Peripheral vascular disease, unspecified: Secondary | ICD-10-CM

## 2019-11-26 DIAGNOSIS — I10 Essential (primary) hypertension: Secondary | ICD-10-CM

## 2019-11-27 ENCOUNTER — Other Ambulatory Visit: Payer: Self-pay

## 2019-11-27 ENCOUNTER — Ambulatory Visit: Payer: BC Managed Care – PPO | Admitting: Internal Medicine

## 2019-11-27 ENCOUNTER — Encounter: Payer: Self-pay | Admitting: Internal Medicine

## 2019-11-27 DIAGNOSIS — I10 Essential (primary) hypertension: Secondary | ICD-10-CM

## 2019-11-27 DIAGNOSIS — E1165 Type 2 diabetes mellitus with hyperglycemia: Secondary | ICD-10-CM

## 2019-11-27 DIAGNOSIS — L040 Acute lymphadenitis of face, head and neck: Secondary | ICD-10-CM

## 2019-11-27 MED ORDER — AMOXICILLIN-POT CLAVULANATE 875-125 MG PO TABS: 1.0000 | ORAL_TABLET | Freq: Two times a day (BID) | ORAL | 0 refills | Status: DC

## 2019-11-27 NOTE — Progress Notes (Signed)
Subjective:    Patient ID: [name] [name], male    DOB: [birth year], 64 y.o.   MRN: [id number]  HPI  Here with c/o 1 wk onset left lower anterior neck pain - started at mid lateral neck, now located mostly low left medial, without swelling, redness, fever, trauma, cough or sob, but has some change in swallowing though can still swallow solids/liquids with vomiting or choking.  Pt denies chest pain, increased sob or doe, wheezing, orthopnea, PND, increased [place] swelling, palpitations, dizziness or syncope.  Pt denies new neurological symptoms such as new headache, or facial or extremity weakness or numbness   Pt denies polydipsia, polyuria,  Denies dental pain or swelling Past Medical History:  Diagnosis Date  . CAD (coronary artery disease)    The patient presented in Aug 2008 with acute cornary syndrome. He did have a bypass surgeru. He did have a 3-vessel disease on heart cathiterization and had bypass surgery with LIMA to the LAD, sequential vein graft to the obtuse marginal - 1 and obtuse marginal -2, and vein graft to the RCA. Adenosine myoview (11/10): EF 64%, normal wall motion, normal perfusion.   . Cholelithiasis 12/07/2016  . GERD (gastroesophageal reflux disease)   . History of echocardiogram    November 2008. EF 65-70%. no regional wall abnormalities. Trivial mitral regurgitation.  [name] HTN (hypertension)   . Hyperlipidemia   . Mild anemia    Hx of post op. 8/08  . Paroxysmal atrial fibrillation (HCC)    only episode noted breifly during hospitalization for PCM placement 11/10.   . Sick sinus syndrome (HCC)    with presyncope. medtronic dual chamber PCM placed 11/10  . Type II or unspecified type diabetes mellitus without mention of complication, not stated as uncontrolled    Past Surgical History:  Procedure Laterality Date  . CORONARY ARTERY BYPASS GRAFT    . PACEMAKER INSERTION     MDT implanted 2010  . rectal abcess     2005. Dr. Carolynne Edouard with interanal sphincterotomy    reports that  he quit smoking about 13 years ago. His smoking use included cigarettes. He has a 20.00 pack-year smoking history. He has never used smokeless tobacco. He reports current alcohol use of about 2.0 standard drinks of alcohol per week. He reports that he does not use drugs. Family history is unknown by patient. Allergies  Allergen Reactions  . Metformin And Related     rash   Current Outpatient Medications on File Prior to Visit  Medication Sig Dispense Refill  . amLODipine (NORVASC) 5 MG tablet TAKE 1 TABLET BY MOUTH DAILY 90 tablet 3  . aspirin 81 MG tablet Take 1 tablet (81 mg total) by mouth daily.    [name] atorvastatin (LIPITOR) 40 MG tablet TAKE 1 TABLET BY MOUTH DAILY AT 6 PM 90 tablet 3  . fenofibrate (TRICOR) 145 MG tablet TAKE 1 TABLET(145 MG) BY MOUTH DAILY 90 tablet 2  . glipiZIDE (GLUCOTROL XL) 10 MG 24 hr tablet Take 1 tablet (10 mg total) by mouth daily with breakfast. 90 tablet 3  . lisinopril (ZESTRIL) 40 MG tablet TAKE 1 TABLET BY MOUTH EVERY DAY 90 tablet 3  . loratadine (CLARITIN) 10 MG tablet Take 1 tablet (10 mg total) by mouth daily. 30 tablet 2  . meloxicam (MOBIC) 15 MG tablet TAKE 1 TABLET BY MOUTH DAILY 90 tablet 3  . metoprolol tartrate (LOPRESSOR) 25 MG tablet Take 1 tablet (25 mg total) by mouth 2 (two) times daily. 180  tablet 3  . Omega-3 Fatty Acids (OMEGA-3 FISH OIL) 1200 MG CAPS Take 1 capsule by mouth 2 (two) times daily.     [name] omeprazole (PRILOSEC) 20 MG capsule TAKE ONE CAPSULE BY MOUTH TWICE DAILY BEFORE A MEAL 180 capsule 0  . triamcinolone cream (KENALOG) 0.1 % Apply 1 application topically 2 (two) times daily. 30 g 0  . nitroGLYCERIN (NITROSTAT) 0.4 MG SL tablet Place 1 tablet (0.4 mg total) under the tongue every 5 (five) minutes as needed for chest pain. 15 tablet 3   No current facility-administered medications on file prior to visit.   Review of Systems All otherwise neg per pt    Objective:   Physical Exam BP 130/75 (BP Location: Left Arm, Patient  Position: Sitting, Cuff Size: Large)   Pulse 83   Temp 98.1 F (36.7 C) (Oral)   Ht 5\' 4"  (1.626 m)   Wt 172 lb (78 kg)   SpO2 97%   BMI 29.52 kg/m  VS noted,  Constitutional: Pt appears in NAD HENT: Head: NCAT. But has significant mild tender left sided neck LA mostly to the preSCM chain but some posteroir and lower mid neck midline as well Right Ear: External ear normal.  Left Ear: External ear normal.  Eyes: . Pupils are equal, round, and reactive to light. Conjunctivae and EOM are normal Nose: without d/c or deformity, no other axillary LA noted Neck: Neck supple. Gross normal ROM Cardiovascular: Normal rate and regular rhythm.   Pulmonary/Chest: Effort normal and breath sounds without rales or wheezing.  Neurological: Pt is alert. At baseline orientation, motor grossly intact Skin: Skin is warm. No rashes, other new lesions, no Amsler edema Psychiatric: Pt behavior is normal without agitation  All otherwise neg per pt Lab Results  Component Value Date   WBC 6.6 06/21/2019   HGB 13.2 06/21/2019   HCT 39.1 06/21/2019   PLT 264.0 06/21/2019   GLUCOSE 79 06/21/2019   CHOL 158 06/21/2019   TRIG 87.0 06/21/2019   HDL 60.00 06/21/2019   LDLDIRECT 89.0 06/15/2018   LDLCALC 81 06/21/2019   ALT 14 06/21/2019   AST 15 06/21/2019   NA 138 06/21/2019   K 4.1 06/21/2019   CL 103 06/21/2019   CREATININE 1.04 06/21/2019   BUN 23 06/21/2019   CO2 31 06/21/2019   TSH 1.84 06/21/2019   PSA 0.31 06/21/2019   INR 0.94 11/10/2008   HGBA1C 7.4 (H) 06/21/2019   MICROALBUR <0.7 06/21/2019      Assessment & Plan:

## 2019-11-27 NOTE — Patient Instructions (Signed)
Please take all new medication as prescribed - the antibiotic  Please continue all other medications as before, and refills have been done if requested.  Please have the pharmacy call with any other refills you may need.  Please continue your efforts at being more active, low cholesterol diet, and weight control.  Please keep your appointments with your specialists as you may have planned  Please return if not improved, as you may need further evaluation such as CT scan or ENT referral

## 2019-12-02 ENCOUNTER — Encounter: Payer: Self-pay | Admitting: Internal Medicine

## 2019-12-02 NOTE — Assessment & Plan Note (Signed)
stable overall by history and exam, recent data reviewed with pt, and pt to continue medical treatment as before,  to f/u any worsening symptoms or concerns  

## 2019-12-02 NOTE — Assessment & Plan Note (Addendum)
I suspect underlying infection such as odontogenic, for augmentin course and f/u if not improved, to consider CT neck and/or ENT  I spent 31 minutes in preparing to see the patient by review of recent labs, imaging and procedures, obtaining and reviewing separately obtained history, communicating with the patient and family or caregiver, ordering medications, tests or procedures, and documenting clinical information in the EHR including the differential Dx, treatment, and any further evaluation and other management of acute LA neck, HTN, dm

## 2019-12-05 DIAGNOSIS — I10 Essential (primary) hypertension: Secondary | ICD-10-CM | POA: Insufficient documentation

## 2019-12-05 DIAGNOSIS — K219 Gastro-esophageal reflux disease without esophagitis: Secondary | ICD-10-CM | POA: Insufficient documentation

## 2019-12-05 DIAGNOSIS — I480 Paroxysmal atrial fibrillation: Secondary | ICD-10-CM | POA: Insufficient documentation

## 2019-12-05 DIAGNOSIS — Z9289 Personal history of other medical treatment: Secondary | ICD-10-CM | POA: Insufficient documentation

## 2019-12-05 DIAGNOSIS — D649 Anemia, unspecified: Secondary | ICD-10-CM | POA: Insufficient documentation

## 2019-12-10 ENCOUNTER — Encounter: Payer: Self-pay | Admitting: Cardiology

## 2019-12-10 ENCOUNTER — Other Ambulatory Visit: Payer: Self-pay

## 2019-12-10 ENCOUNTER — Ambulatory Visit (INDEPENDENT_AMBULATORY_CARE_PROVIDER_SITE_OTHER): Payer: BC Managed Care – PPO | Admitting: Cardiology

## 2019-12-10 VITALS — BP 132/70 | HR 67 | Ht 64.0 in | Wt 172.0 lb

## 2019-12-10 DIAGNOSIS — E785 Hyperlipidemia, unspecified: Secondary | ICD-10-CM

## 2019-12-10 DIAGNOSIS — I480 Paroxysmal atrial fibrillation: Secondary | ICD-10-CM

## 2019-12-10 DIAGNOSIS — I10 Essential (primary) hypertension: Secondary | ICD-10-CM | POA: Diagnosis not present

## 2019-12-10 DIAGNOSIS — I495 Sick sinus syndrome: Secondary | ICD-10-CM

## 2019-12-10 DIAGNOSIS — Z950 Presence of cardiac pacemaker: Secondary | ICD-10-CM

## 2019-12-10 MED ORDER — EZETIMIBE 10 MG PO TABS: 10.0000 mg | ORAL_TABLET | Freq: Every day | ORAL | 2 refills | Status: DC

## 2019-12-10 NOTE — Patient Instructions (Signed)
Medication Instructions:  Your physician has recommended you make the following change in your medication:   START: Zetia 10 mg daily   *If you need a refill on your cardiac medications before your next appointment, please call your pharmacy*   Lab Work: Your physician recommends that you return for lab work in 6 weeks fasting: lipid   If you have labs (blood work) drawn today and your tests are completely normal, you will receive your results only by: [name] MyChart Message (if you have MyChart) OR . A paper copy in the mail If you have any lab test that is abnormal or we need to change your treatment, we will call you to review the results.   Testing/Procedures: None   Follow-Up: At [facility], you and your health needs are our priority.  As part of our continuing mission to provide you with exceptional heart care, we have created designated Provider Care Teams.  These Care Teams include your primary Cardiologist (physician) and Advanced Practice Providers (APPs -  Physician Assistants and Nurse Practitioners) who all work together to provide you with the care you need, when you need it.  We recommend signing up for the patient portal called "MyChart".  Sign up information is provided on this After Visit Summary.  MyChart is used to connect with patients for Virtual Visits (Telemedicine).  Patients are able to view lab/test results, encounter notes, upcoming appointments, etc.  Non-urgent messages can be sent to your provider as well.   To learn more about what you can do with MyChart, go to [email or web address].    Your next appointment:   1 year(s)  The format for your next appointment:   In Person  Provider:   Thomasene Ripple, DO   Other Instructions  Ezetimibe Tablets What is this medicine? EZETIMIBE (ez ET i mibe) blocks the absorption of cholesterol from the stomach. It can help lower blood cholesterol for patients who are at risk of getting heart disease or a stroke.  It is only for patients whose cholesterol level is not controlled by diet. This medicine may be used for other purposes; ask your health care provider or pharmacist if you have questions. COMMON BRAND NAME(S): Zetia What should I tell my health care provider before I take this medicine? They need to know if you have any of these conditions:  liver disease  an unusual or allergic reaction to ezetimibe, medicines, foods, dyes, or preservatives  pregnant or trying to get pregnant  breast-feeding How should I use this medicine? Take this medicine by mouth with a glass of water. Follow the directions on the prescription label. This medicine can be taken with or without food. Take your doses at regular intervals. Do not take your medicine more often than directed. Talk to your pediatrician regarding the use of this medicine in children. Special care may be needed. Overdosage: If you think you have taken too much of this medicine contact a poison control center or emergency room at once. NOTE: This medicine is only for you. Do not share this medicine with others. What if I miss a dose? If you miss a dose, take it as soon as you can. If it is almost time for your next dose, take only that dose. Do not take double or extra doses. What may interact with this medicine? Do not take this medicine with any of the following medications:  fenofibrate  gemfibrozil This medicine may also interact with the following medications:  antacids  cyclosporine  herbal medicines like red yeast rice  other medicines to lower cholesterol or triglycerides This list may not describe all possible interactions. Give your health care provider a list of all the medicines, herbs, non-prescription drugs, or dietary supplements you use. Also tell them if you smoke, drink alcohol, or use illegal drugs. Some items may interact with your medicine. What should I watch for while using this medicine? Visit your doctor or  health care professional for regular checks on your progress. You will need to have your cholesterol levels checked. If you are also taking some other cholesterol medicines, you will also need to have tests to make sure your liver is working properly. Tell your doctor or health care professional if you get any unexplained muscle pain, tenderness, or weakness, especially if you also have a fever and tiredness. You need to follow a low-cholesterol, low-fat diet while you are taking this medicine. This will decrease your risk of getting heart and blood vessel disease. Exercising and avoiding alcohol and smoking can also help. Ask your doctor or dietician for advice. What side effects may I notice from receiving this medicine? Side effects that you should report to your doctor or health care professional as soon as possible:  allergic reactions like skin rash, itching or hives, swelling of the face, lips, or tongue  dark yellow or brown urine  unusually weak or tired  yellowing of the skin or eyes Side effects that usually do not require medical attention (report to your doctor or health care professional if they continue or are bothersome):  diarrhea  dizziness  headache  stomach upset or pain This list may not describe all possible side effects. Call your doctor for medical advice about side effects. You may report side effects to FDA at 1-800-FDA-1088. Where should I keep my medicine? Keep out of the reach of children. Store at room temperature between 15 and 30 degrees C (59 and 86 degrees F). Protect from moisture. Keep container tightly closed. Throw away any unused medicine after the expiration date. NOTE: This sheet is a summary. It may not cover all possible information. If you have questions about this medicine, talk to your doctor, pharmacist, or health care provider.  2020 Elsevier/Gold Standard (2011-07-05 15:39:09)

## 2019-12-10 NOTE — Progress Notes (Addendum)
Cardiology Office Note:    Date:  12/10/2019   ID:  KASHON KRAYNAK, DOB 1955/03/15, MRN [id number]  PCP:  Corwin Levins, MD  Cardiologist:  Thomasene Ripple, DO  Electrophysiologist:  Hillis Range, MD   Referring MD: Corwin Levins, MD    ' I am doing well"  History of Present Illness:    [name] [name] is a 64 y.o. male with a hx of coronary artery disease status post CABG in 2008 with LIMA to LAD, SVG to OM1 and OM 2, SVG to RCA, symptomatic bradycardia/sick sinus syndrome status post pacemaker, hypertension, hyperlipidemia, diabetes type 2 and postoperative paroxysmal atrial fibrillation.  The patient is here today for follow-up visit. The patient was seen in the general cardiology clinic in November 2020 at that time he was doing well on his guideline directed medical therapy he was on aspirin, beta-blocker statin.  In the interim the patient did see Dr. Johney Frame during that visit he reported that he had been experiencing some exercise-induced bilateral calf pain.  It was recommended that the patient undergo a bilateral arterial lower extremity Dopplers and noninvasive PAD past testing this is scheduled for Wednesday.  He still is experiencing these bilateral leg/calf pain.  No chest pain or shortness of breath.  Past Medical History:  Diagnosis Date  . CAD (coronary artery disease)    The patient presented in Aug 2008 with acute cornary syndrome. He did have a bypass surgeru. He did have a 3-vessel disease on heart cathiterization and had bypass surgery with LIMA to the LAD, sequential vein graft to the obtuse marginal - 1 and obtuse marginal -2, and vein graft to the RCA. Adenosine myoview (11/10): EF 64%, normal wall motion, normal perfusion.   . Cholelithiasis 12/07/2016  . GERD (gastroesophageal reflux disease)   . History of echocardiogram    November 2008. EF 65-70%. no regional wall abnormalities. Trivial mitral regurgitation.  [name] HTN (hypertension)   . Hyperlipidemia   . Mild anemia    Hx  of post op. 8/08  . Paroxysmal atrial fibrillation (HCC)    only episode noted breifly during hospitalization for PCM placement 11/10.   . Sick sinus syndrome (HCC)    with presyncope. medtronic dual chamber PCM placed 11/10  . Type II or unspecified type diabetes mellitus without mention of complication, not stated as uncontrolled     Past Surgical History:  Procedure Laterality Date  . CORONARY ARTERY BYPASS GRAFT    . PACEMAKER INSERTION     MDT implanted 2010  . rectal abcess     2005. Dr. Carolynne Edouard with interanal sphincterotomy    Current Medications: Current Meds  Medication Sig  . amLODipine (NORVASC) 5 MG tablet TAKE 1 TABLET BY MOUTH DAILY  . amoxicillin-clavulanate (AUGMENTIN) 875-125 MG tablet Take 1 tablet by mouth 2 (two) times daily.  [name] aspirin 81 MG tablet Take 1 tablet (81 mg total) by mouth daily.  [name] atorvastatin (LIPITOR) 40 MG tablet TAKE 1 TABLET BY MOUTH DAILY AT 6 PM  . fenofibrate (TRICOR) 145 MG tablet TAKE 1 TABLET(145 MG) BY MOUTH DAILY  . glipiZIDE (GLUCOTROL XL) 10 MG 24 hr tablet Take 1 tablet (10 mg total) by mouth daily with breakfast.  . lisinopril (ZESTRIL) 40 MG tablet TAKE 1 TABLET BY MOUTH EVERY DAY  . loratadine (CLARITIN) 10 MG tablet Take 1 tablet (10 mg total) by mouth daily.  . meloxicam (MOBIC) 15 MG tablet TAKE 1 TABLET BY MOUTH DAILY  . metoprolol  tartrate (LOPRESSOR) 25 MG tablet Take 1 tablet (25 mg total) by mouth 2 (two) times daily.  . Omega-3 Fatty Acids (OMEGA-3 FISH OIL) 1200 MG CAPS Take 1 capsule by mouth 2 (two) times daily.   [name] omeprazole (PRILOSEC) 20 MG capsule TAKE ONE CAPSULE BY MOUTH TWICE DAILY BEFORE A MEAL  . triamcinolone cream (KENALOG) 0.1 % Apply 1 application topically 2 (two) times daily.     Allergies:   Metformin and related   Social History   Socioeconomic History  . Marital status: Married    Spouse name: Not on file  . Number of children: Not on file  . Years of education: Not on file  . Highest  education level: Not on file  Occupational History  . Not on file  Tobacco Use  . Smoking status: Former Smoker    Packs/day: 1.00    Years: 20.00    Pack years: 20.00    Types: Cigarettes    Quit date: 01/11/2006    Years since quitting: 13.9  . Smokeless tobacco: Never Used  Vaping Use  . Vaping Use: Never used  Substance and Sexual Activity  . Alcohol use: Yes    Alcohol/week: 2.0 standard drinks    Types: 2 Cans of beer per week    Comment: social on weekends  . Drug use: No  . Sexual activity: Not on file  Other Topics Concern  . Not on file  Social History Narrative   Married, 3 children. To [place] from [place] 1979. Work- sign spray pain- Copywriter, advertising   Social Determinants of Corporate investment banker Strain:   . Difficulty of Paying Living Expenses: Not on file  Food Insecurity:   . Worried About Programme researcher, broadcasting/film/video in the Last Year: Not on file  . Ran Out of Food in the Last Year: Not on file  Transportation Needs:   . Lack of Transportation (Medical): Not on file  . Lack of Transportation (Non-Medical): Not on file  Physical Activity:   . Days of Exercise per Week: Not on file  . Minutes of Exercise per Session: Not on file  Stress:   . Feeling of Stress : Not on file  Social Connections:   . Frequency of Communication with Friends and Family: Not on file  . Frequency of Social Gatherings with Friends and Family: Not on file  . Attends Religious Services: Not on file  . Active Member of Clubs or Organizations: Not on file  . Attends Banker Meetings: Not on file  . Marital Status: Not on file     Family History: The patient's Family history is unknown by patient.  ROS:   Review of Systems  Constitution: Negative for decreased appetite, fever and weight gain.  HENT: Negative for congestion, ear discharge, hoarse voice and sore throat.   Eyes: Negative for discharge, redness, vision loss in right eye and visual halos.  Cardiovascular:  Negative for chest pain, dyspnea on exertion, leg swelling, orthopnea and palpitations.  Respiratory: Negative for cough, hemoptysis, shortness of breath and snoring.   Endocrine: Negative for heat intolerance and polyphagia.  Hematologic/Lymphatic: Negative for bleeding problem. Does not bruise/bleed easily.  Skin: Negative for flushing, nail changes, rash and suspicious lesions.  Musculoskeletal: Negative for arthritis, joint pain, muscle cramps, myalgias, neck pain and stiffness.  Gastrointestinal: Negative for abdominal pain, bowel incontinence, diarrhea and excessive appetite.  Genitourinary: Negative for decreased libido, genital sores and incomplete emptying.  Neurological: Negative for brief  paralysis, focal weakness, headaches and loss of balance.  Psychiatric/Behavioral: Negative for altered mental status, depression and suicidal ideas.  Allergic/Immunologic: Negative for HIV exposure and persistent infections.    EKGs/Labs/Other Studies Reviewed:    The following studies were reviewed today:   EKG:  The ekg ordered today demonstrates atrial paced rhythm with prolonged AV conduction, heart rate 6 7 bpm.  Recent Labs: 06/21/2019: ALT 14; BUN 23; Creatinine, Ser 1.04; Hemoglobin 13.2; Platelets 264.0; Potassium 4.1; Sodium 138; TSH 1.84  Recent Lipid Panel    Component Value Date/Time   CHOL 158 06/21/2019 1631   TRIG 87.0 06/21/2019 1631   HDL 60.00 06/21/2019 1631   CHOLHDL 3 06/21/2019 1631   VLDL 17.4 06/21/2019 1631   LDLCALC 81 06/21/2019 1631   LDLDIRECT 89.0 06/15/2018 1455    Physical Exam:    VS:  BP 132/70   Pulse 67   Ht 5\' 4"  (1.626 m)   Wt 172 lb (78 kg)   SpO2 97%   BMI 29.52 kg/m     Wt Readings from Last 3 Encounters:  12/10/19 172 lb (78 kg)  11/27/19 172 lb (78 kg)  11/22/19 168 lb 9.6 oz (76.5 kg)     GEN: Well nourished, well developed in no acute distress HEENT: Normal NECK: No JVD; No carotid bruits LYMPHATICS: No  lymphadenopathy CARDIAC: S1S2 noted,RRR, no murmurs, rubs, gallops RESPIRATORY:  Clear to auscultation without rales, wheezing or rhonchi  ABDOMEN: Soft, non-tender, non-distended, +bowel sounds, no guarding. EXTREMITIES: No edema, No cyanosis, no clubbing MUSCULOSKELETAL:  No deformity  SKIN: Warm and dry NEUROLOGIC:  Alert and oriented x 3, non-focal PSYCHIATRIC:  Normal affect, good insight  ASSESSMENT:    1. Hyperlipidemia, unspecified hyperlipidemia type   2. Sick sinus syndrome (HCC)   3. Essential hypertension   4. Paroxysmal atrial fibrillation (HCC)   5. Pacemaker-Medtronic    PLAN:     1.  He has no angina symptoms have gone to continue the patient on his current medication regimen his beta-blocker was recently reduced to metoprolol 25 mg twice daily.  He will stay on his aspirin as well as Lipitor 40 mg daily.  I like to add Zetia to the patient medication regimen given the fact that he is LDL on his most recent lipid profile was 81.  His LDL goal is less than 70 therefore I am hoping that the addition of Zetia will help.  2.  Hyperlipidemia plan as listed above he is on Lipitor and TriCor we will add Zetia.  Repeat lipid profile in 6 weeks 3.  Hypertension stable no changes will be required in his blood pressure regimen today. 4.  Symptomatic bradycardia he is status post pacemaker recent interrogation showed normal functioning. 5.  Exertional calf pain testing is pending. 6.  For brief episode of atrial fibrillation he is CHA2DS2-VASc 4 however patient had deferred on anticoagulation at this time.es   The patient is in agreement with the above plan. The patient left the office in stable condition.  The patient will follow up in 1 year or sooner if needed.   Medication Adjustments/Labs and Tests Ordered: Current medicines are reviewed at length with the patient today.  Concerns regarding medicines are outlined above.  Orders Placed This Encounter  Procedures  . Lipid  Profile  . EKG 12-Lead   Meds ordered this encounter  Medications  . ezetimibe (ZETIA) 10 MG tablet    Sig: Take 1 tablet (10 mg total) by mouth daily.  Dispense:  90 tablet    Refill:  2    Patient Instructions  Medication Instructions:  Your physician has recommended you make the following change in your medication:   START: Zetia 10 mg daily   *If you need a refill on your cardiac medications before your next appointment, please call your pharmacy*   Lab Work: Your physician recommends that you return for lab work in 6 weeks fasting: lipid   If you have labs (blood work) drawn today and your tests are completely normal, you will receive your results only by: [name]. MyChart Message (if you have MyChart) OR . A paper copy in the mail If you have any lab test that is abnormal or we need to change your treatment, we will call you to review the results.   Testing/Procedures: None   Follow-Up: At [facility], you and your health needs are our priority.  As part of our continuing mission to provide you with exceptional heart care, we have created designated Provider Care Teams.  These Care Teams include your primary Cardiologist (physician) and Advanced Practice Providers (APPs -  Physician Assistants and Nurse Practitioners) who all work together to provide you with the care you need, when you need it.  We recommend signing up for the patient portal called "MyChart".  Sign up information is provided on this After Visit Summary.  MyChart is used to connect with patients for Virtual Visits (Telemedicine).  Patients are able to view lab/test results, encounter notes, upcoming appointments, etc.  Non-urgent messages can be sent to your provider as well.   To learn more about what you can do with MyChart, go to [email or web address].    Your next appointment:   1 year(s)  The format for your next appointment:   In Person  Provider:   Thomasene RippleKardie Vernadine Coombs, DO   Other  Instructions  Ezetimibe Tablets What is this medicine? EZETIMIBE (ez ET i mibe) blocks the absorption of cholesterol from the stomach. It can help lower blood cholesterol for patients who are at risk of getting heart disease or a stroke. It is only for patients whose cholesterol level is not controlled by diet. This medicine may be used for other purposes; ask your health care provider or pharmacist if you have questions. COMMON BRAND NAME(S): Zetia What should I tell my health care provider before I take this medicine? They need to know if you have any of these conditions:  liver disease  an unusual or allergic reaction to ezetimibe, medicines, foods, dyes, or preservatives  pregnant or trying to get pregnant  breast-feeding How should I use this medicine? Take this medicine by mouth with a glass of water. Follow the directions on the prescription label. This medicine can be taken with or without food. Take your doses at regular intervals. Do not take your medicine more often than directed. Talk to your pediatrician regarding the use of this medicine in children. Special care may be needed. Overdosage: If you think you have taken too much of this medicine contact a poison control center or emergency room at once. NOTE: This medicine is only for you. Do not share this medicine with others. What if I miss a dose? If you miss a dose, take it as soon as you can. If it is almost time for your next dose, take only that dose. Do not take double or extra doses. What may interact with this medicine? Do not take this medicine with any of the following medications:  fenofibrate  gemfibrozil This medicine may also interact with the following medications:  antacids  cyclosporine  herbal medicines like red yeast rice  other medicines to lower cholesterol or triglycerides This list may not describe all possible interactions. Give your health care provider a list of all the medicines, herbs,  non-prescription drugs, or dietary supplements you use. Also tell them if you smoke, drink alcohol, or use illegal drugs. Some items may interact with your medicine. What should I watch for while using this medicine? Visit your doctor or health care professional for regular checks on your progress. You will need to have your cholesterol levels checked. If you are also taking some other cholesterol medicines, you will also need to have tests to make sure your liver is working properly. Tell your doctor or health care professional if you get any unexplained muscle pain, tenderness, or weakness, especially if you also have a fever and tiredness. You need to follow a low-cholesterol, low-fat diet while you are taking this medicine. This will decrease your risk of getting heart and blood vessel disease. Exercising and avoiding alcohol and smoking can also help. Ask your doctor or dietician for advice. What side effects may I notice from receiving this medicine? Side effects that you should report to your doctor or health care professional as soon as possible:  allergic reactions like skin rash, itching or hives, swelling of the face, lips, or tongue  dark yellow or brown urine  unusually weak or tired  yellowing of the skin or eyes Side effects that usually do not require medical attention (report to your doctor or health care professional if they continue or are bothersome):  diarrhea  dizziness  headache  stomach upset or pain This list may not describe all possible side effects. Call your doctor for medical advice about side effects. You may report side effects to FDA at 1-800-FDA-1088. Where should I keep my medicine? Keep out of the reach of children. Store at room temperature between 15 and 30 degrees C (59 and 86 degrees F). Protect from moisture. Keep container tightly closed. Throw away any unused medicine after the expiration date. NOTE: This sheet is a summary. It may not cover all  possible information. If you have questions about this medicine, talk to your doctor, pharmacist, or health care provider.  2020 Elsevier/Gold Standard (2011-07-05 15:39:09)      Adopting a Healthy Lifestyle.  Know what a healthy weight is for you (roughly BMI <25) and aim to maintain this   Aim for 7+ servings of fruits and vegetables daily   65-80+ fluid ounces of water or unsweet tea for healthy kidneys   Limit to max 1 drink of alcohol per day; avoid smoking/tobacco   Limit animal fats in diet for cholesterol and heart health - choose grass fed whenever available   Avoid highly processed foods, and foods high in saturated/trans fats   Aim for low stress - take time to unwind and care for your mental health   Aim for 150 min of moderate intensity exercise weekly for heart health, and weights twice weekly for bone health   Aim for 7-9 hours of sleep daily   When it comes to diets, agreement about the perfect plan isnt easy to find, even among the experts. Experts at the [facility] [employer] developed an idea known as the Healthy Eating Plate. Just imagine a plate divided into logical, healthy portions.   The emphasis is on diet quality:   Load up on  vegetables and fruits - one-half of your plate: Aim for color and variety, and remember that potatoes dont count.   Go for whole grains - one-quarter of your plate: Whole wheat, barley, wheat berries, quinoa, oats, brown rice, and foods made with them. If you want pasta, go with whole wheat pasta.   Protein power - one-quarter of your plate: Fish, chicken, beans, and nuts are all healthy, versatile protein sources. Limit red meat.   The diet, however, does go beyond the plate, offering a few other suggestions.   Use healthy plant oils, such as olive, canola, soy, corn, sunflower and peanut. Check the labels, and avoid partially hydrogenated oil, which have unhealthy trans fats.   If youre thirsty, drink water.  Coffee and tea are good in moderation, but skip sugary drinks and limit milk and dairy products to one or two daily servings.   The type of carbohydrate in the diet is more important than the amount. Some sources of carbohydrates, such as vegetables, fruits, whole grains, and beans-are healthier than others.   Finally, stay active  Signed, Thomasene Ripple, DO  12/10/2019 4:03 PM    [facility]

## 2019-12-12 ENCOUNTER — Ambulatory Visit (HOSPITAL_COMMUNITY)
Admission: RE | Admit: 2019-12-12 | Discharge: 2019-12-12 | Disposition: A | Discharge status: Home / Self Care | Payer: BC Managed Care – PPO | Source: Ambulatory Visit | Attending: Cardiology | Admitting: Cardiology

## 2019-12-12 ENCOUNTER — Other Ambulatory Visit: Payer: Self-pay

## 2019-12-12 DIAGNOSIS — I495 Sick sinus syndrome: Secondary | ICD-10-CM

## 2019-12-12 DIAGNOSIS — I4891 Unspecified atrial fibrillation: Secondary | ICD-10-CM | POA: Diagnosis not present

## 2019-12-12 DIAGNOSIS — I2510 Atherosclerotic heart disease of native coronary artery without angina pectoris: Secondary | ICD-10-CM | POA: Diagnosis not present

## 2019-12-12 DIAGNOSIS — I739 Peripheral vascular disease, unspecified: Secondary | ICD-10-CM

## 2019-12-12 DIAGNOSIS — I10 Essential (primary) hypertension: Secondary | ICD-10-CM

## 2019-12-18 ENCOUNTER — Telehealth: Payer: Self-pay | Admitting: *Deleted

## 2019-12-18 DIAGNOSIS — M79605 Pain in left leg: Secondary | ICD-10-CM

## 2019-12-18 DIAGNOSIS — M79604 Pain in right leg: Secondary | ICD-10-CM

## 2019-12-18 NOTE — Telephone Encounter (Signed)
-----   Message from Hillis Range, MD sent at 12/17/2019  8:42 AM EST ----- [name] [name], Please refer to Dr Allyson Sabal for me.  Thanks!  Fayrene Fearing ----- Message ----- From: Runell Gess, MD Sent: 12/15/2019   7:18 PM EST To: Hillis Range, MD  I would be more than happy to see him [name], JJB ----- Message ----- From: Hillis Range, MD Sent: 12/15/2019   5:40 PM EST To: Sampson Goon, RN, Runell Gess, MD, #  Results reviewed.  [name] [name], please inform pt of result. I will route to primary care also. Dr Allyson Sabal, would you like for this patient to see you or vascular surgery at this time?

## 2019-12-18 NOTE — Telephone Encounter (Signed)
Referral ordered.  Patient made aware.

## 2019-12-24 ENCOUNTER — Ambulatory Visit: Payer: BC Managed Care – PPO | Admitting: Internal Medicine

## 2019-12-25 ENCOUNTER — Encounter: Payer: Self-pay | Admitting: Internal Medicine

## 2019-12-25 ENCOUNTER — Other Ambulatory Visit: Payer: Self-pay | Admitting: Internal Medicine

## 2019-12-25 ENCOUNTER — Other Ambulatory Visit: Payer: Self-pay

## 2019-12-25 ENCOUNTER — Ambulatory Visit: Payer: BC Managed Care – PPO | Admitting: Internal Medicine

## 2019-12-25 VITALS — BP 122/60 | HR 84 | Temp 98.2°F | Ht 64.0 in | Wt 163.0 lb

## 2019-12-25 DIAGNOSIS — E7849 Other hyperlipidemia: Secondary | ICD-10-CM | POA: Diagnosis not present

## 2019-12-25 DIAGNOSIS — E1165 Type 2 diabetes mellitus with hyperglycemia: Secondary | ICD-10-CM | POA: Diagnosis not present

## 2019-12-25 DIAGNOSIS — I480 Paroxysmal atrial fibrillation: Secondary | ICD-10-CM

## 2019-12-25 DIAGNOSIS — I10 Essential (primary) hypertension: Secondary | ICD-10-CM

## 2019-12-25 DIAGNOSIS — K219 Gastro-esophageal reflux disease without esophagitis: Secondary | ICD-10-CM

## 2019-12-25 DIAGNOSIS — L040 Acute lymphadenitis of face, head and neck: Secondary | ICD-10-CM

## 2019-12-25 NOTE — Progress Notes (Deleted)
   Subjective:    Patient ID: [name] [name], male    DOB: 01-27-1955, 64 y.o.   MRN: [id number]  HPI   Lost 9 lbs with diet and exercise.   BP Readings from Last 3 Encounters:  12/25/19 122/60  12/10/19 132/70  11/27/19 130/75   Wt Readings from Last 3 Encounters:  12/25/19 163 lb (73.9 kg)  12/10/19 172 lb (78 kg)  11/27/19 172 lb (78 kg)     Review of Systems     Objective:   Physical Exam BP 122/60 (BP Location: Left Arm, Patient Position: Sitting, Cuff Size: Large)   Pulse 84   Temp 98.2 F (36.8 C) (Oral)   Ht 5\' 4"  (1.626 m)   Wt 163 lb (73.9 kg)   SpO2 97%   BMI 27.98 kg/m         Assessment & Plan:

## 2019-12-25 NOTE — Patient Instructions (Signed)

## 2019-12-25 NOTE — Telephone Encounter (Signed)
Please refill as per office routine med refill policy (all routine meds refilled for 3 mo or monthly per pt preference up to one year from last visit, then month to month grace period for 3 mo, then further med refills will have to be denied)  

## 2019-12-26 LAB — HEPATIC FUNCTION PANEL
ALT: 17 U/L (ref 0–53)
AST: 17 U/L (ref 0–37)
Albumin: 4.7 g/dL (ref 3.5–5.2)
Alkaline Phosphatase: 41 U/L (ref 39–117)
Bilirubin, Direct: 0.1 mg/dL (ref 0.0–0.3)
Total Bilirubin: 0.4 mg/dL (ref 0.2–1.2)
Total Protein: 7.6 g/dL (ref 6.0–8.3)

## 2019-12-26 LAB — LIPID PANEL
Cholesterol: 138 mg/dL (ref 0–200)
HDL: 55.4 mg/dL (ref >39.00)
LDL Cholesterol: 53 mg/dL (ref 0–99)
NonHDL: 82.48
Total CHOL/HDL Ratio: 2
Triglycerides: 148 mg/dL (ref 0.0–149.0)
VLDL: 29.6 mg/dL (ref 0.0–40.0)

## 2019-12-26 LAB — BASIC METABOLIC PANEL
BUN: 22 mg/dL (ref 6–23)
CO2: 29 mEq/L (ref 19–32)
Calcium: 10.6 mg/dL — ABNORMAL HIGH (ref 8.4–10.5)
Chloride: 104 mEq/L (ref 96–112)
Creatinine, Ser: 1.12 mg/dL (ref 0.40–1.50)
GFR: 68.77 mL/min (ref >60.00)
Glucose, Bld: 141 mg/dL — ABNORMAL HIGH (ref 70–99)
Potassium: 5.3 mEq/L — ABNORMAL HIGH (ref 3.5–5.1)
Sodium: 140 mEq/L (ref 135–145)

## 2019-12-26 LAB — HEMOGLOBIN A1C: Hgb A1c MFr Bld: 7.3 % — ABNORMAL HIGH (ref 4.6–6.5)

## 2019-12-27 ENCOUNTER — Encounter: Payer: Self-pay | Admitting: Internal Medicine

## 2019-12-31 ENCOUNTER — Other Ambulatory Visit: Payer: Self-pay | Admitting: Internal Medicine

## 2019-12-31 NOTE — Telephone Encounter (Signed)
Please refill as per office routine med refill policy (all routine meds refilled for 3 mo or monthly per pt preference up to one year from last visit, then month to month grace period for 3 mo, then further med refills will have to be denied)  

## 2020-01-10 ENCOUNTER — Encounter: Payer: Self-pay | Admitting: Internal Medicine

## 2020-01-10 NOTE — Progress Notes (Signed)
Established Patient Office Visit  Subjective:  Patient ID: [name] [name], male    DOB: 09-25-1955  Age: 64 y.o. MRN: [id number]      Chief Complaint: (concise statement describing the symptom, problem, condition, diagnosis, physician recommended return, or other factor as reason for encounter) follow up HTN, HLD and hyperglycemia, paf, gerd, lymphadenitis       HPI:  [name] [name] is a 64 y.o. male here to f/u; overall doing ok,  Pt denies chest pain, increasing sob or doe, wheezing, orthopnea, PND, increased Shurtz swelling, palpitations, dizziness or syncope.  Pt denies new neurological symptoms such as new headache, or facial or extremity weakness or numbness.  Pt denies polydipsia, polyuria, or symptomatic low sugars. Pt states overall good compliance with meds, mostly trying to follow appropriate diet, Lost 9 lbs with diet and exercise.  Has no further Dubach pain to suggest claudication   Pt denies fever, wt loss, night sweats, loss of appetite, or other constitutional symptoms  No further painful tender neck lymph nodes.  Denies worsening reflux, abd pain, dysphagia, n/v, bowel change or blood.   .       Wt Readings from Last 3 Encounters:  12/25/19 163 lb (73.9 kg)  12/10/19 172 lb (78 kg)  11/27/19 172 lb (78 kg)   BP Readings from Last 3 Encounters:  12/25/19 122/60  12/10/19 132/70  11/27/19 130/75         Past Medical History:  Diagnosis Date  . CAD (coronary artery disease)    The patient presented in Aug 2008 with acute cornary syndrome. He did have a bypass surgeru. He did have a 3-vessel disease on heart cathiterization and had bypass surgery with LIMA to the LAD, sequential vein graft to the obtuse marginal - 1 and obtuse marginal -2, and vein graft to the RCA. Adenosine myoview (11/10): EF 64%, normal wall motion, normal perfusion.   . Cholelithiasis 12/07/2016  . GERD (gastroesophageal reflux disease)   . History of echocardiogram    November 2008. EF 65-70%. no regional wall  abnormalities. Trivial mitral regurgitation.  [name] HTN (hypertension)   . Hyperlipidemia   . Mild anemia    Hx of post op. 8/08  . Paroxysmal atrial fibrillation (HCC)    only episode noted breifly during hospitalization for PCM placement 11/10.   . Sick sinus syndrome (HCC)    with presyncope. medtronic dual chamber PCM placed 11/10  . Type II or unspecified type diabetes mellitus without mention of complication, not stated as uncontrolled    Past Surgical History:  Procedure Laterality Date  . CORONARY ARTERY BYPASS GRAFT    . PACEMAKER INSERTION     MDT implanted 2010  . rectal abcess     2005. Dr. Carolynne Edouard with interanal sphincterotomy    reports that he quit smoking about 14 years ago. His smoking use included cigarettes. He has a 20.00 pack-year smoking history. He has never used smokeless tobacco. He reports current alcohol use of about 2.0 standard drinks of alcohol per week. He reports that he does not use drugs. Family history is unknown by patient. Allergies  Allergen Reactions  . Metformin And Related     rash   Current Outpatient Medications on File Prior to Visit  Medication Sig Dispense Refill  . amLODipine (NORVASC) 5 MG tablet TAKE 1 TABLET BY MOUTH DAILY 90 tablet 3  . aspirin 81 MG tablet Take 1 tablet (81 mg total) by mouth daily.    [name] atorvastatin (  LIPITOR) 40 MG tablet TAKE 1 TABLET BY MOUTH DAILY AT 6 PM 90 tablet 3  . ezetimibe (ZETIA) 10 MG tablet Take 1 tablet (10 mg total) by mouth daily. 90 tablet 2  . fenofibrate (TRICOR) 145 MG tablet TAKE 1 TABLET(145 MG) BY MOUTH DAILY 90 tablet 2  . lisinopril (ZESTRIL) 40 MG tablet TAKE 1 TABLET BY MOUTH EVERY DAY 90 tablet 3  . loratadine (CLARITIN) 10 MG tablet Take 1 tablet (10 mg total) by mouth daily. 30 tablet 2  . meloxicam (MOBIC) 15 MG tablet TAKE 1 TABLET BY MOUTH DAILY 90 tablet 3  . metoprolol tartrate (LOPRESSOR) 25 MG tablet Take 1 tablet (25 mg total) by mouth 2 (two) times daily. 180 tablet 3  . Omega-3  Fatty Acids (OMEGA-3 FISH OIL) 1200 MG CAPS Take 1 capsule by mouth 2 (two) times daily.    . nitroGLYCERIN (NITROSTAT) 0.4 MG SL tablet Place 1 tablet (0.4 mg total) under the tongue every 5 (five) minutes as needed for chest pain. 15 tablet 3   No current facility-administered medications on file prior to visit.        ROS:  All others reviewed and negative.  Objective        PE:  BP 122/60 (BP Location: Left Arm, Patient Position: Sitting, Cuff Size: Large)   Pulse 84   Temp 98.2 F (36.8 C) (Oral)   Ht 5\' 4"  (1.626 m)   Wt 163 lb (73.9 kg)   SpO2 97%   BMI 27.98 kg/m                 Constitutional: Pt appears in NAD               HENT: Head: NCAT.                Right Ear: External ear normal.                 Left Ear: External ear normal.                Eyes: . Pupils are equal, round, and reactive to light. Conjunctivae and EOM are normal               Nose: without d/c or deformity               Neck: Neck supple. Gross normal ROM               Cardiovascular: Normal rate and regular rhythm.                 Pulmonary/Chest: Effort normal and breath sounds without rales or wheezing.                Abd:  Soft, NT, ND, + BS, no organomegaly               Neurological: Pt is alert. At baseline orientation, motor grossly intact               Skin: Skin is warm. No rashes, no other new lesions, Rasmus edema - none               Psychiatric: Pt behavior is normal without agitation   Assessment/Plan:  [name] [name] is a 64 y.o. Asian [4] male with  has a past medical history of CAD (coronary artery disease), Cholelithiasis (12/07/2016), GERD (gastroesophageal reflux disease), History of echocardiogram, HTN (hypertension), Hyperlipidemia, Mild anemia, Paroxysmal atrial fibrillation (HCC), Sick sinus syndrome (  HCC), and Type II or unspecified type diabetes mellitus without mention of complication, not stated as uncontrolled.   Assessment Plan  See problem oriented assessment and plan Labs  reviewed for each problem: Lab Results  Component Value Date   WBC 6.6 06/21/2019   HGB 13.2 06/21/2019   HCT 39.1 06/21/2019   PLT 264.0 06/21/2019   GLUCOSE 141 (H) 12/25/2019   CHOL 138 12/25/2019   TRIG 148.0 12/25/2019   HDL 55.40 12/25/2019   LDLDIRECT 89.0 06/15/2018   LDLCALC 53 12/25/2019   ALT 17 12/25/2019   AST 17 12/25/2019   NA 140 12/25/2019   K 5.3 No hemolysis seen (H) 12/25/2019   CL 104 12/25/2019   CREATININE 1.12 12/25/2019   BUN 22 12/25/2019   CO2 29 12/25/2019   TSH 1.84 06/21/2019   PSA 0.31 06/21/2019   INR 0.94 11/10/2008   HGBA1C 7.3 (H) 12/25/2019   MICROALBUR <0.7 06/21/2019    Micro: none  Cardiac tracings I have personally interpreted today:  none  Pertinent Radiological findings (summarize): - Dec 12 2018 Kruschke arterial study   I spent total 31 minutes in caring for the patient for this visit:  1) by communicating with the patient and family/caregiver during the visit  2) by review of pertinent vital sign data, physical examination and labs as documented in the assessment and plan  3) by review of pertinent imaging - none today  4) by review of pertinent procedures - none today  5) by obtaining and reviewing separately obtained information from family/caretaker and Care Everywhere - none today  6) by ordering medications  7) by ordering tests  8) by documenting all of this clinical information in the EHR including the management of each problem noted today in assessment and plan  There are no preventive care reminders to display for this patient.  Problem List Items Addressed This Visit      High   Paroxysmal atrial fibrillation (HCC)    stable overall by history and exam, recent data reviewed with pt, and pt to continue medical treatment as before,  to f/u any worsening symptoms or concerns         Medium   Hyperlipidemia    Lab Results  Component Value Date   LDLCALC 53 12/25/2019  stable overall by history and exam,  recent data reviewed with pt, and pt to continue medical treatment as before,  to f/u any worsening symptoms or concerns      GERD    stable overall by history and exam, recent data reviewed with pt, and pt to continue medical treatment as before,  to f/u any worsening symptoms or concerns       Essential hypertension    BP Readings from Last 3 Encounters:  12/25/19 122/60  12/10/19 132/70  11/27/19 130/75  stable overall by history and exam, recent data reviewed with pt, and pt to continue medical treatment as before,  to f/u any worsening symptoms or concerns       Diabetes (HCC) - Primary    Lab Results  Component Value Date   HGBA1C 7.3 (H) 12/25/2019  stable overall by history and exam, recent data reviewed with pt, and pt to continue medical treatment as before,  to f/u any worsening symptoms or concerns       Relevant Orders   Hemoglobin A1c (Completed)   Lipid panel (Completed)   Hepatic function panel (Completed)   Basic metabolic panel (Completed)     Low  Acute lymphadenitis of neck    Resolved,  to f/u any worsening symptoms or concerns         No orders of the defined types were placed in this encounter.   Follow-up: Return in about 6 months (around 06/24/2020).   Oliver Barre, MD 01/10/2020 3:24 PM [facility]

## 2020-01-10 NOTE — Assessment & Plan Note (Signed)
stable overall by history and exam, recent data reviewed with pt, and pt to continue medical treatment as before,  to f/u any worsening symptoms or concerns  

## 2020-01-10 NOTE — Assessment & Plan Note (Signed)
BP Readings from Last 3 Encounters:  12/25/19 122/60  12/10/19 132/70  11/27/19 130/75  stable overall by history and exam, recent data reviewed with pt, and pt to continue medical treatment as before,  to f/u any worsening symptoms or concerns

## 2020-01-10 NOTE — Assessment & Plan Note (Signed)
Lab Results  Component Value Date   LDLCALC 53 12/25/2019  stable overall by history and exam, recent data reviewed with pt, and pt to continue medical treatment as before,  to f/u any worsening symptoms or concerns

## 2020-01-10 NOTE — Assessment & Plan Note (Signed)
Resolved,  to f/u any worsening symptoms or concerns  

## 2020-01-10 NOTE — Assessment & Plan Note (Signed)
Lab Results  Component Value Date   HGBA1C 7.3 (H) 12/25/2019  stable overall by history and exam, recent data reviewed with pt, and pt to continue medical treatment as before,  to f/u any worsening symptoms or concerns

## 2020-01-24 ENCOUNTER — Other Ambulatory Visit: Payer: Self-pay | Admitting: Internal Medicine

## 2020-02-12 ENCOUNTER — Encounter: Payer: Self-pay | Admitting: Cardiovascular Disease

## 2020-02-12 ENCOUNTER — Other Ambulatory Visit: Payer: Self-pay

## 2020-02-12 ENCOUNTER — Ambulatory Visit (INDEPENDENT_AMBULATORY_CARE_PROVIDER_SITE_OTHER): Payer: BC Managed Care – PPO | Admitting: Cardiovascular Disease

## 2020-02-12 VITALS — BP 146/68 | HR 78 | Ht 64.0 in | Wt 175.4 lb

## 2020-02-12 DIAGNOSIS — I739 Peripheral vascular disease, unspecified: Secondary | ICD-10-CM | POA: Diagnosis not present

## 2020-02-12 DIAGNOSIS — E785 Hyperlipidemia, unspecified: Secondary | ICD-10-CM | POA: Diagnosis not present

## 2020-02-12 NOTE — Assessment & Plan Note (Signed)
Patient was referred by Dr. Johney Frame for bilateral calf claudication left greater than right.  He does have peripheral vascular risk factors as well as a history of CAD.  He walks on a treadmill for an hour at a time and is noticed recently some calf claudication at 20 to 30 minutes.  Recent Doppler studies performed 12/12/2019 revealed normal ABIs on the right with a moderately elevated signal in the right common iliac artery and a left ABI of 0.88 with no evidence of a critical lesion.  At this point, I recommended conservative therapy and annual Doppler surveillance.

## 2020-02-12 NOTE — Patient Instructions (Signed)
Medication Instructions:  Your physician recommends that you continue on your current medications as directed. Please refer to the Current Medication list given to you today.   *If you need a refill on your cardiac medications before your next appointment, please call your pharmacy*   Testing/Procedures: Your physician has requested that you have a lower extremity arterial duplex. This test is an ultrasound of the arteries in the legs or arms. It looks at arterial blood flow in the legs. Allow one hour for Lower Arterial scans. There are no restrictions or special instructions. To be done at [address] [facility]. 2nd Floor. To be done in Dec. 2022.  Your physician has requested that you have an ankle brachial index (ABI). During this test an ultrasound and blood pressure cuff are used to evaluate the arteries that supply the arms and legs with blood. Allow thirty minutes for this exam. There are no restrictions or special instructions. To be done at [address] [facility]. 2nd Floor. To be done in Dec. 2022.    Follow-Up: At [facility], you and your health needs are our priority.  As part of our continuing mission to provide you with exceptional heart care, we have created designated Provider Care Teams.  These Care Teams include your primary Cardiologist (physician) and Advanced Practice Providers (APPs -  Physician Assistants and Nurse Practitioners) who all work together to provide you with the care you need, when you need it.  We recommend signing up for the patient portal called "MyChart".  Sign up information is provided on this After Visit Summary.  MyChart is used to connect with patients for Virtual Visits (Telemedicine).  Patients are able to view lab/test results, encounter notes, upcoming appointments, etc.  Non-urgent messages can be sent to your provider as well.   To learn more about what you can do with MyChart, go to [email or web address].    Your next appointment:   No future  appointments have been made. If you need to be seen please give our office a call.   Provider:   Nanetta Batty, MD

## 2020-02-12 NOTE — Progress Notes (Signed)
02/12/2020 [name] [name]   1955/05/25  [id number]  Primary Physician Jonny Ruiz Len Blalock, MD Primary Cardiologist: Runell Gess MD FACP, [place], [place], [place]  HPI:  [name] [name] is a 65 y.o. mild to moderately overweight married Asian male father of 2 children, grandfather of 3 grandchildren who works doing Dietitian.  He was referred by Dr. Hillis Range, his electrophysiologist for evaluation of lifestyle limiting claudication.  His primary care provider is Dr. Oliver Barre and his cardiologist Dr. Thomasene Ripple.  He does not smoke.  He has a history of treated hypertension, hyperlipidemia and diabetes.  He had CABG in 2008.  He has PAF/sick sinus syndrome status post permanent transvenous pacemaker insertion followed by Dr. Johney Frame.  He walks on the treadmill for an hour at a time and is noticed some bilateral calf claudication recently left greater than right.  Recent lower extremity arterial Doppler studies performed 12/12/2019 revealed a right ABI of 1.01 with elevated velocities in the right common iliac artery and a left ABI of 0.88 without significant focal lesion.   Current Meds  Medication Sig  . amLODipine (NORVASC) 5 MG tablet TAKE 1 TABLET BY MOUTH DAILY  . aspirin 81 MG tablet Take 1 tablet (81 mg total) by mouth daily.  [name] atorvastatin (LIPITOR) 40 MG tablet TAKE 1 TABLET BY MOUTH DAILY AT 6 PM  . ezetimibe (ZETIA) 10 MG tablet Take 1 tablet (10 mg total) by mouth daily.  . fenofibrate (TRICOR) 145 MG tablet TAKE 1 TABLET(145 MG) BY MOUTH DAILY  . glipiZIDE (GLUCOTROL XL) 10 MG 24 hr tablet TAKE 1 TABLET(10 MG) BY MOUTH DAILY WITH BREAKFAST  . lisinopril (ZESTRIL) 40 MG tablet TAKE 1 TABLET BY MOUTH EVERY DAY  . loratadine (CLARITIN) 10 MG tablet Take 1 tablet (10 mg total) by mouth daily.  . meloxicam (MOBIC) 15 MG tablet TAKE 1 TABLET BY MOUTH DAILY  . metoprolol tartrate (LOPRESSOR) 25 MG tablet Take 1 tablet (25 mg total) by mouth 2 (two) times daily.  . Omega-3 Fatty Acids (OMEGA-3  FISH OIL) 1200 MG CAPS Take 1 capsule by mouth 2 (two) times daily.  [name] omeprazole (PRILOSEC) 20 MG capsule TAKE ONE CAPSULE BY MOUTH TWICE DAILY BEFORE A MEAL     Allergies  Allergen Reactions  . Metformin And Related     rash    Social History   Socioeconomic History  . Marital status: Married    Spouse name: Not on file  . Number of children: Not on file  . Years of education: Not on file  . Highest education level: Not on file  Occupational History  . Not on file  Tobacco Use  . Smoking status: Former Smoker    Packs/day: 1.00    Years: 20.00    Pack years: 20.00    Types: Cigarettes    Quit date: 01/11/2006    Years since quitting: 14.0  . Smokeless tobacco: Never Used  Vaping Use  . Vaping Use: Never used  Substance and Sexual Activity  . Alcohol use: Yes    Alcohol/week: 2.0 standard drinks    Types: 2 Cans of beer per week    Comment: social on weekends  . Drug use: No  . Sexual activity: Not on file  Other Topics Concern  . Not on file  Social History Narrative   Married, 3 children. To [place] from [place] 1979. Work- sign spray pain- Copywriter, advertising   Social Determinants of Health  Financial Resource Strain: Not on file  Food Insecurity: Not on file  Transportation Needs: Not on file  Physical Activity: Not on file  Stress: Not on file  Social Connections: Not on file  Intimate Partner Violence: Not on file     Review of Systems: General: negative for chills, fever, night sweats or weight changes.  Cardiovascular: negative for chest pain, dyspnea on exertion, edema, orthopnea, palpitations, paroxysmal nocturnal dyspnea or shortness of breath Dermatological: negative for rash Respiratory: negative for cough or wheezing Urologic: negative for hematuria Abdominal: negative for nausea, vomiting, diarrhea, bright red blood per rectum, melena, or hematemesis Neurologic: negative for visual changes, syncope, or dizziness All other systems reviewed and are  otherwise negative except as noted above.    Blood pressure (!) 146/68, pulse 78, height 5\' 4"  (1.626 m), weight 175 lb 6.4 oz (79.6 kg).  General appearance: alert and no distress Neck: no adenopathy, no carotid bruit, no JVD, supple, symmetrical, trachea midline and thyroid not enlarged, symmetric, no tenderness/mass/nodules Lungs: clear to auscultation bilaterally Heart: regular rate and rhythm, S1, S2 normal, no murmur, click, rub or gallop Extremities: extremities normal, atraumatic, no cyanosis or edema Pulses: 2+ and symmetric Skin: Skin color, texture, turgor normal. No rashes or lesions Neurologic: Alert and oriented X 3, normal strength and tone. Normal symmetric reflexes. Normal coordination and gait  EKG not performed today  ASSESSMENT AND PLAN:   Peripheral arterial disease (HCC) Patient was referred by Dr. for bilateral calf claudication left greater than right.  He does have peripheral vascular risk factors as well as a history of CAD.  He walks on a treadmill for an hour at a time and is noticed recently some calf claudication at 20 to 30 minutes.  Recent Doppler studies performed 12/12/2019 revealed normal ABIs on the right with a moderately elevated signal in the right common iliac artery and a left ABI of 0.88 with no evidence of a critical lesion.  At this point, I recommended conservative therapy and annual Doppler surveillance.      14/01/2019 MD FACP,FACC,FAHA, [facility] 02/12/2020 2:09 PM

## 2020-02-19 ENCOUNTER — Ambulatory Visit: Payer: BC Managed Care – PPO | Admitting: Cardiovascular Disease

## 2020-02-29 ENCOUNTER — Other Ambulatory Visit: Payer: Self-pay | Admitting: Internal Medicine

## 2020-02-29 NOTE — Telephone Encounter (Signed)
Please refill as per office routine med refill policy (all routine meds refilled for 3 mo or monthly per pt preference up to one year from last visit, then month to month grace period for 3 mo, then further med refills will have to be denied)  

## 2020-05-14 ENCOUNTER — Ambulatory Visit (INDEPENDENT_AMBULATORY_CARE_PROVIDER_SITE_OTHER): Payer: Medicare Other

## 2020-05-14 DIAGNOSIS — I495 Sick sinus syndrome: Secondary | ICD-10-CM | POA: Diagnosis not present

## 2020-05-16 LAB — CUP PACEART REMOTE DEVICE CHECK
Battery Impedance: 1827 Ohm
Battery Remaining Longevity: 40 mo
Battery Voltage: 2.76 V
Brady Statistic AP VP Percent: 0 %
Brady Statistic AP VS Percent: 67 %
Brady Statistic AS VP Percent: 0 %
Brady Statistic AS VS Percent: 33 %
Date Time Interrogation Session: 20220505144333
Implantable Lead Implant Date: 20101101
Implantable Lead Implant Date: 20101101
Implantable Lead Location: 753859
Implantable Lead Location: 753860
Implantable Lead Model: 5076
Implantable Lead Model: 5076
Implantable Pulse Generator Implant Date: 20101101
Lead Channel Impedance Value: 533 Ohm
Lead Channel Impedance Value: 559 Ohm
Lead Channel Pacing Threshold Amplitude: 0.625 V
Lead Channel Pacing Threshold Amplitude: 0.75 V
Lead Channel Pacing Threshold Pulse Width: 0.4 ms
Lead Channel Pacing Threshold Pulse Width: 0.4 ms
Lead Channel Setting Pacing Amplitude: 2 V
Lead Channel Setting Pacing Amplitude: 2.5 V
Lead Channel Setting Pacing Pulse Width: 0.4 ms
Lead Channel Setting Sensing Sensitivity: 5.6 mV

## 2020-05-29 ENCOUNTER — Other Ambulatory Visit: Payer: Self-pay | Admitting: Internal Medicine

## 2020-05-30 ENCOUNTER — Telehealth: Payer: Self-pay | Admitting: Internal Medicine

## 2020-06-02 ENCOUNTER — Encounter: Payer: Self-pay | Admitting: Internal Medicine

## 2020-06-02 ENCOUNTER — Other Ambulatory Visit: Payer: Self-pay

## 2020-06-02 ENCOUNTER — Ambulatory Visit (INDEPENDENT_AMBULATORY_CARE_PROVIDER_SITE_OTHER): Payer: Medicare Other | Admitting: Internal Medicine

## 2020-06-02 VITALS — BP 138/70 | HR 71 | Temp 98.1°F | Ht 64.0 in | Wt 177.0 lb

## 2020-06-02 DIAGNOSIS — E538 Deficiency of other specified B group vitamins: Secondary | ICD-10-CM | POA: Diagnosis not present

## 2020-06-02 DIAGNOSIS — E559 Vitamin D deficiency, unspecified: Secondary | ICD-10-CM

## 2020-06-02 DIAGNOSIS — I10 Essential (primary) hypertension: Secondary | ICD-10-CM

## 2020-06-02 DIAGNOSIS — E1165 Type 2 diabetes mellitus with hyperglycemia: Secondary | ICD-10-CM | POA: Diagnosis not present

## 2020-06-02 DIAGNOSIS — Z0001 Encounter for general adult medical examination with abnormal findings: Secondary | ICD-10-CM | POA: Diagnosis not present

## 2020-06-02 DIAGNOSIS — E7800 Pure hypercholesterolemia, unspecified: Secondary | ICD-10-CM

## 2020-06-02 DIAGNOSIS — R21 Rash and other nonspecific skin eruption: Secondary | ICD-10-CM | POA: Diagnosis not present

## 2020-06-02 LAB — PSA: PSA: 0.34 ng/mL (ref 0.10–4.00)

## 2020-06-02 LAB — CBC WITH DIFFERENTIAL/PLATELET
Basophils Absolute: 0.1 10*3/uL (ref 0.0–0.1)
Basophils Relative: 0.8 % (ref 0.0–3.0)
Eosinophils Absolute: 0.3 10*3/uL (ref 0.0–0.7)
Eosinophils Relative: 4 % (ref 0.0–5.0)
HCT: 41.1 % (ref 39.0–52.0)
Hemoglobin: 13.6 g/dL (ref 13.0–17.0)
Lymphocytes Relative: 28.9 % (ref 12.0–46.0)
Lymphs Abs: 1.9 10*3/uL (ref 0.7–4.0)
MCHC: 33.2 g/dL (ref 30.0–36.0)
MCV: 89.6 fl (ref 78.0–100.0)
Monocytes Absolute: 0.7 10*3/uL (ref 0.1–1.0)
Monocytes Relative: 11.2 % (ref 3.0–12.0)
Neutro Abs: 3.7 10*3/uL (ref 1.4–7.7)
Neutrophils Relative %: 55.1 % (ref 43.0–77.0)
Platelets: 240 10*3/uL (ref 150.0–400.0)
RBC: 4.59 Mil/uL (ref 4.22–5.81)
RDW: 12.7 % (ref 11.5–15.5)
WBC: 6.7 10*3/uL (ref 4.0–10.5)

## 2020-06-02 LAB — URINALYSIS, ROUTINE W REFLEX MICROSCOPIC
Bilirubin Urine: NEGATIVE
Hgb urine dipstick: NEGATIVE
Ketones, ur: NEGATIVE
Leukocytes,Ua: NEGATIVE
Nitrite: NEGATIVE
RBC / HPF: NONE SEEN (ref 0–?)
Specific Gravity, Urine: 1.01 (ref 1.000–1.030)
Total Protein, Urine: NEGATIVE
Urine Glucose: 100 — AB
Urobilinogen, UA: 0.2 (ref 0.0–1.0)
pH: 6.5 (ref 5.0–8.0)

## 2020-06-02 LAB — MICROALBUMIN / CREATININE URINE RATIO
Creatinine,U: 49.5 mg/dL
Microalb Creat Ratio: 1.4 mg/g (ref 0.0–30.0)
Microalb, Ur: <0.7 mg/dL (ref 0.0–1.9)

## 2020-06-02 LAB — VITAMIN B12: Vitamin B-12: 310 pg/mL (ref 211–911)

## 2020-06-02 LAB — HEMOGLOBIN A1C: Hgb A1c MFr Bld: 7.6 % — ABNORMAL HIGH (ref 4.6–6.5)

## 2020-06-02 LAB — VITAMIN D 25 HYDROXY (VIT D DEFICIENCY, FRACTURES): VITD: 24.99 ng/mL — ABNORMAL LOW (ref 30.00–100.00)

## 2020-06-02 LAB — TSH: TSH: 3.21 u[IU]/mL (ref 0.35–4.50)

## 2020-06-02 MED ORDER — GLIPIZIDE ER 10 MG PO TB24: ORAL_TABLET | ORAL | 3 refills | Status: DC

## 2020-06-02 MED ORDER — LISINOPRIL 40 MG PO TABS: 40.0000 mg | ORAL_TABLET | Freq: Every day | ORAL | 3 refills | Status: DC

## 2020-06-02 MED ORDER — ATORVASTATIN CALCIUM 40 MG PO TABS
ORAL_TABLET | ORAL | 3 refills | Status: DC
Start: 2020-06-02 — End: 2021-03-31

## 2020-06-02 MED ORDER — AMLODIPINE BESYLATE 5 MG PO TABS
1.0000 | ORAL_TABLET | Freq: Every day | ORAL | 3 refills | Status: DC
Start: 2020-06-02 — End: 2021-03-31

## 2020-06-02 MED ORDER — METOPROLOL TARTRATE 25 MG PO TABS: 25.0000 mg | ORAL_TABLET | Freq: Two times a day (BID) | ORAL | 3 refills | Status: DC

## 2020-06-02 MED ORDER — TRIAMCINOLONE ACETONIDE 0.1 % EX CREA: 1.0000 "application " | TOPICAL_CREAM | Freq: Two times a day (BID) | CUTANEOUS | 3 refills | Status: DC

## 2020-06-02 MED ORDER — FENOFIBRATE 145 MG PO TABS: ORAL_TABLET | ORAL | 3 refills | Status: DC

## 2020-06-02 MED ORDER — EZETIMIBE 10 MG PO TABS
10.0000 mg | ORAL_TABLET | Freq: Every day | ORAL | 3 refills | Status: DC
Start: 2020-06-02 — End: 2021-03-31

## 2020-06-02 MED ORDER — OMEPRAZOLE 20 MG PO CPDR: DELAYED_RELEASE_CAPSULE | ORAL | 3 refills | Status: DC

## 2020-06-02 NOTE — Progress Notes (Signed)
Patient ID: [name] [name], male   DOB: 06-09-1955, 65 y.o.   MRN: [id number]         Chief Complaint:: wellness exam and Follow-up (6 months/Patient requests cream for itching on arms)  and dm, hld, htn       HPI:  [name] [name] is a 65 y.o. male here for wellness exam; dedclines pneumovax o/w up to date with preventive referrals and immunizations.                        Also has new onset itchy nontender rash to torso and arms with multiple small spots slighty raised to the torso as well as both arms. Mild, constant, nothing seems to make better or worse   Pt denies chest pain, increased sob or doe, wheezing, orthopnea, PND, increased Quant swelling, palpitations, dizziness or syncope.   Pt denies polydipsia, polyuria, or new focal neuro s/s.   Pt denies fever, wt loss, night sweats, loss of appetite, or other constitutional symptoms  Denies worsening reflux, abd pain, dysphagia, n/v, bowel change or blood.    Wt Readings from Last 3 Encounters:  06/02/20 177 lb (80.3 kg)  02/12/20 175 lb 6.4 oz (79.6 kg)  12/25/19 163 lb (73.9 kg)   BP Readings from Last 3 Encounters:  06/02/20 138/70  02/12/20 (!) 146/68  12/25/19 122/60   Immunization History  Administered Date(s) Administered  . Influenza Inj Mdck Quad Pf 09/30/2017  . Influenza Whole 11/11/2008  . Influenza,inj,Quad PF,6+ Mos 10/22/2016, 10/17/2018  . Influenza-Unspecified 09/11/2013, 10/11/2016, 10/15/2019  . PFIZER(Purple Top)SARS-COV-2 Vaccination 03/09/2019, 04/04/2019, 11/16/2019  . Pneumococcal Polysaccharide-23 08/12/2006, 05/01/2014  . Td 01/11/2001  . Tdap 06/08/2017   Health Maintenance Due  Topic Date Due  . Zoster Vaccines- Shingrix (1 of 2) Never done      Past Medical History:  Diagnosis Date  . CAD (coronary artery disease)    The patient presented in Aug 2008 with acute cornary syndrome. He did have a bypass surgeru. He did have a 3-vessel disease on heart cathiterization and had bypass surgery with LIMA to the LAD,  sequential vein graft to the obtuse marginal - 1 and obtuse marginal -2, and vein graft to the RCA. Adenosine myoview (11/10): EF 64%, normal wall motion, normal perfusion.   . Cholelithiasis 12/07/2016  . GERD (gastroesophageal reflux disease)   . History of echocardiogram    November 2008. EF 65-70%. no regional wall abnormalities. Trivial mitral regurgitation.  [name] HTN (hypertension)   . Hyperlipidemia   . Mild anemia    Hx of post op. 8/08  . Paroxysmal atrial fibrillation (HCC)    only episode noted breifly during hospitalization for PCM placement 11/10.   . Sick sinus syndrome (HCC)    with presyncope. medtronic dual chamber PCM placed 11/10  . Type II or unspecified type diabetes mellitus without mention of complication, not stated as uncontrolled    Past Surgical History:  Procedure Laterality Date  . CORONARY ARTERY BYPASS GRAFT    . PACEMAKER INSERTION     MDT implanted 2010  . rectal abcess     2005. Dr. Carolynne Edouard with interanal sphincterotomy    reports that he quit smoking about 14 years ago. His smoking use included cigarettes. He has a 20.00 pack-year smoking history. He has never used smokeless tobacco. He reports current alcohol use of about 2.0 standard drinks of alcohol per week. He reports that he does not use drugs. Family history is  unknown by patient. Allergies  Allergen Reactions  . Metformin And Related     rash   Current Outpatient Medications on File Prior to Visit  Medication Sig Dispense Refill  . aspirin 81 MG tablet Take 1 tablet (81 mg total) by mouth daily.    [name] loratadine (CLARITIN) 10 MG tablet Take 1 tablet (10 mg total) by mouth daily. 30 tablet 2  . meloxicam (MOBIC) 15 MG tablet TAKE 1 TABLET BY MOUTH DAILY 90 tablet 1  . Omega-3 Fatty Acids (OMEGA-3 FISH OIL) 1200 MG CAPS Take 1 capsule by mouth 2 (two) times daily.    . nitroGLYCERIN (NITROSTAT) 0.4 MG SL tablet Place 1 tablet (0.4 mg total) under the tongue every 5 (five) minutes as needed for  chest pain. 15 tablet 3   No current facility-administered medications on file prior to visit.        ROS:  All others reviewed and negative.  Objective        PE:  BP 138/70 (BP Location: Right Arm, Patient Position: Sitting, Cuff Size: Normal)   Pulse 71   Temp 98.1 F (36.7 C) (Oral)   Ht 5\' 4"  (1.626 m)   Wt 177 lb (80.3 kg)   SpO2 98%   BMI 30.38 kg/m                 Constitutional: Pt appears in NAD               HENT: Head: NCAT.                Right Ear: External ear normal.                 Left Ear: External ear normal.                Eyes: . Pupils are equal, round, and reactive to light. Conjunctivae and EOM are normal               Nose: without d/c or deformity               Neck: Neck supple. Gross normal ROM               Cardiovascular: Normal rate and regular rhythm.                 Pulmonary/Chest: Effort normal and breath sounds without rales or wheezing.                Abd:  Soft, NT, ND, + BS, no organomegaly               Neurological: Pt is alert. At baseline orientation, motor grossly intact               Skin: Monreal edema - none, but does have multiple small itchy nontender erythem lesions to torso and arms with blanching               Psychiatric: Pt behavior is normal without agitation   Micro: none  Cardiac tracings I have personally interpreted today:  none  Pertinent Radiological findings (summarize): none   Lab Results  Component Value Date   WBC 6.7 06/02/2020   HGB 13.6 06/02/2020   HCT 41.1 06/02/2020   PLT 240.0 06/02/2020   GLUCOSE 143 (H) 06/02/2020   CHOL 127 06/02/2020   TRIG 212.0 (H) 06/02/2020   HDL 48.00 06/02/2020   LDLDIRECT 53.0 06/02/2020   LDLCALC 53 12/25/2019   ALT 13  06/02/2020   AST 14 06/02/2020   NA 138 06/02/2020   K 4.1 06/02/2020   CL 103 06/02/2020   CREATININE 1.22 06/02/2020   BUN 23 06/02/2020   CO2 25 06/02/2020   TSH 3.21 06/02/2020   PSA 0.34 06/02/2020   INR 0.94 11/10/2008   HGBA1C 7.6 (H)  06/02/2020   MICROALBUR <0.7 06/02/2020   Assessment/Plan:  [name] [name] is a 65 y.o. Asian [4] male with  has a past medical history of CAD (coronary artery disease), Cholelithiasis (12/07/2016), GERD (gastroesophageal reflux disease), History of echocardiogram, HTN (hypertension), Hyperlipidemia, Mild anemia, Paroxysmal atrial fibrillation (HCC), Sick sinus syndrome (HCC), and Type II or unspecified type diabetes mellitus without mention of complication, not stated as uncontrolled.  Encounter for well adult exam with abnormal findings Age and sex appropriate education and counseling updated with regular exercise and diet Referrals for preventative services - none needed Immunizations addressed - declines pneumovax Smoking counseling  - none needed Evidence for depression or other mood disorder - none significant Most recent labs reviewed. I have personally reviewed and have noted: 1) the patient's medical and social history 2) The patient's current medications and supplements 3) The patient's height, weight, and BMI have been recorded in the chart   Diabetes (HCC)  Stable, pt to continue current medical treatment glucotrol and consider add actos for a1c > 7   Hyperlipidemia Lab Results  Component Value Date   LDLCALC 53 12/25/2019   Stable, pt to continue current tricor and zetia   Rash Etiology unclear, ? Hive like but relatively few lesions to torso and arms, ok for triam cr prn asd,  to f/u any worsening symptoms or concerns  Essential hypertension BP Readings from Last 3 Encounters:  06/02/20 138/70  02/12/20 (!) 146/68  12/25/19 122/60   Stable, pt to continue medical treatment norvasc, lisinopril   Vitamin D deficiency Last vitamin D Lab Results  Component Value Date   VD25OH 24.99 (L) 06/02/2020   Low, to start oral replacement   Followup: Return in about 6 months (around 12/03/2020).  Oliver Barre, MD 06/09/2020 12:37 PM [facility]  Primary Care - [facility]

## 2020-06-02 NOTE — Patient Instructions (Signed)
Please take all new medication as prescribed - the cream for the itch and rash as needed  Please continue all other medications as before, and refills have been done if requested.  Please have the pharmacy call with any other refills you may need.  Please continue your efforts at being more active, low cholesterol diet, and weight control.  You are otherwise up to date with prevention measures today.  Please keep your appointments with your specialists as you may have planned  Please go to the LAB at the blood drawing area for the tests to be done  You will be contacted by phone if any changes need to be made immediately.  Otherwise, you will receive a letter about your results with an explanation, but please check with MyChart first.  Please remember to sign up for MyChart if you have not done so, as this will be important to you in the future with finding out test results, communicating by private email, and scheduling acute appointments online when needed.  Please make an Appointment to return in 6 months, or sooner if needed

## 2020-06-03 LAB — LIPID PANEL
Cholesterol: 127 mg/dL (ref 0–200)
HDL: 48 mg/dL (ref >39.00)
NonHDL: 78.94
Total CHOL/HDL Ratio: 3
Triglycerides: 212 mg/dL — ABNORMAL HIGH (ref 0.0–149.0)
VLDL: 42.4 mg/dL — ABNORMAL HIGH (ref 0.0–40.0)

## 2020-06-03 LAB — BASIC METABOLIC PANEL
BUN: 23 mg/dL (ref 6–23)
CO2: 25 mEq/L (ref 19–32)
Calcium: 9.7 mg/dL (ref 8.4–10.5)
Chloride: 103 mEq/L (ref 96–112)
Creatinine, Ser: 1.22 mg/dL (ref 0.40–1.50)
GFR: 61.87 mL/min (ref >60.00)
Glucose, Bld: 143 mg/dL — ABNORMAL HIGH (ref 70–99)
Potassium: 4.1 mEq/L (ref 3.5–5.1)
Sodium: 138 mEq/L (ref 135–145)

## 2020-06-03 LAB — HEPATIC FUNCTION PANEL
ALT: 13 U/L (ref 0–53)
AST: 14 U/L (ref 0–37)
Albumin: 4.3 g/dL (ref 3.5–5.2)
Alkaline Phosphatase: 37 U/L — ABNORMAL LOW (ref 39–117)
Bilirubin, Direct: 0.1 mg/dL (ref 0.0–0.3)
Total Bilirubin: 0.4 mg/dL (ref 0.2–1.2)
Total Protein: 6.9 g/dL (ref 6.0–8.3)

## 2020-06-03 LAB — LDL CHOLESTEROL, DIRECT: Direct LDL: 53 mg/dL

## 2020-06-03 NOTE — Progress Notes (Signed)
Remote pacemaker transmission.   

## 2020-06-06 ENCOUNTER — Other Ambulatory Visit: Payer: Self-pay | Admitting: Internal Medicine

## 2020-06-06 ENCOUNTER — Encounter: Payer: Self-pay | Admitting: Internal Medicine

## 2020-06-06 MED ORDER — THERA-D 2000 50 MCG (2000 UT) PO TABS: ORAL_TABLET | ORAL | 99 refills | Status: AC

## 2020-06-06 MED ORDER — PIOGLITAZONE HCL 15 MG PO TABS: 15.0000 mg | ORAL_TABLET | Freq: Every day | ORAL | 3 refills | Status: DC

## 2020-06-09 ENCOUNTER — Encounter: Payer: Self-pay | Admitting: Internal Medicine

## 2020-06-09 DIAGNOSIS — E559 Vitamin D deficiency, unspecified: Secondary | ICD-10-CM | POA: Insufficient documentation

## 2020-06-09 NOTE — Assessment & Plan Note (Signed)
Lab Results  Component Value Date   LDLCALC 53 12/25/2019   Stable, pt to continue current tricor and zetia

## 2020-06-09 NOTE — Assessment & Plan Note (Signed)
Age and sex appropriate education and counseling updated with regular exercise and diet Referrals for preventative services - none needed Immunizations addressed - declines pneumovax Smoking counseling  - none needed Evidence for depression or other mood disorder - none significant Most recent labs reviewed. I have personally reviewed and have noted: 1) the patient's medical and social history 2) The patient's current medications and supplements 3) The patient's height, weight, and BMI have been recorded in the chart 

## 2020-06-09 NOTE — Assessment & Plan Note (Signed)
Last vitamin D Lab Results  Component Value Date   VD25OH 24.99 (L) 06/02/2020   Low, to start oral replacement

## 2020-06-09 NOTE — Assessment & Plan Note (Signed)
BP Readings from Last 3 Encounters:  06/02/20 138/70  02/12/20 (!) 146/68  12/25/19 122/60   Stable, pt to continue medical treatment norvasc, lisinopril

## 2020-06-09 NOTE — Assessment & Plan Note (Addendum)
  Stable, pt to continue current medical treatment glucotrol and consider add actos for a1c > 7

## 2020-06-09 NOTE — Assessment & Plan Note (Signed)
Etiology unclear, ? Hive like but relatively few lesions to torso and arms, ok for triam cr prn asd,  to f/u any worsening symptoms or concerns

## 2020-06-24 ENCOUNTER — Ambulatory Visit: Payer: BC Managed Care – PPO | Admitting: Internal Medicine

## 2020-07-18 ENCOUNTER — Other Ambulatory Visit: Payer: Self-pay | Admitting: Internal Medicine

## 2020-07-18 NOTE — Telephone Encounter (Signed)
Please refill as per office routine med refill policy (all routine meds refilled for 3 mo or monthly per pt preference up to one year from last visit, then month to month grace period for 3 mo, then further med refills will have to be denied)  

## 2020-08-13 ENCOUNTER — Ambulatory Visit (INDEPENDENT_AMBULATORY_CARE_PROVIDER_SITE_OTHER): Payer: Medicare Other

## 2020-08-13 DIAGNOSIS — I495 Sick sinus syndrome: Secondary | ICD-10-CM

## 2020-08-14 LAB — CUP PACEART REMOTE DEVICE CHECK
Battery Impedance: 1856 Ohm
Battery Remaining Longevity: 39 mo
Battery Voltage: 2.76 V
Brady Statistic AP VP Percent: 0 %
Brady Statistic AP VS Percent: 68 %
Brady Statistic AS VP Percent: 0 %
Brady Statistic AS VS Percent: 31 %
Date Time Interrogation Session: 20220804110003
Implantable Lead Implant Date: 20101101
Implantable Lead Implant Date: 20101101
Implantable Lead Location: 753859
Implantable Lead Location: 753860
Implantable Lead Model: 5076
Implantable Lead Model: 5076
Implantable Pulse Generator Implant Date: 20101101
Lead Channel Impedance Value: 510 Ohm
Lead Channel Impedance Value: 510 Ohm
Lead Channel Pacing Threshold Amplitude: 0.625 V
Lead Channel Pacing Threshold Amplitude: 0.875 V
Lead Channel Pacing Threshold Pulse Width: 0.4 ms
Lead Channel Pacing Threshold Pulse Width: 0.4 ms
Lead Channel Setting Pacing Amplitude: 2 V
Lead Channel Setting Pacing Amplitude: 2.5 V
Lead Channel Setting Pacing Pulse Width: 0.4 ms
Lead Channel Setting Sensing Sensitivity: 5.6 mV

## 2020-09-09 NOTE — Progress Notes (Signed)
Remote pacemaker transmission.   

## 2020-11-05 ENCOUNTER — Encounter: Payer: Medicare Other | Admitting: Student

## 2020-11-09 NOTE — Progress Notes (Signed)
Cardiology Office Note Date:  11/13/2020  Patient ID:  [name] [name], [name] [name] September 21, 1955, MRN [id number] PCP:  Corwin Levins, MD  Cardiologist:  Dr. Servando Salina Vascular: Dr. Allyson Sabal Electrophysiologist: Dr. Johney Frame    Chief Complaint:  annual visit  History of Present Illness: [name] [name] is a 65 y.o. male with history of CAD (CABG 2008), HTN, Afib, SSSx s/p PPM, HLD, PVD (following with Dr. Allyson Sabal, medical management at this time), HTN, HLD, DM, AFib.  He comes in today to be seen for Dr. Johney Frame, last seen by him Nov 2021, c/o calf pain b/l with ambulation and referred to Dr Allyson Sabal. Afib burden was <0.1% and OAC therapy deferred to monitor burden.  His metoprolol reduced.  He saw Dr. Allyson Sabal Feb 2022: "He walks on a treadmill for an hour at a time and is noticed recently some calf claudication at 20 to 30 minutes.  Recent Doppler studies performed 12/12/2019 revealed normal ABIs on the right with a moderately elevated signal in the right common iliac artery and a left ABI of 0.88 with no evidence of a critical lesion.  At this point, I recommended conservative therapy and annual Doppler surveillance"   TODAY He is doing very well. Has retired though stays quite active and is working part Herbalist as well. He walks on the treadmill 3-4x week  He denies any kind of CP, palpitations or cardiac awareness No SOB, DOE No dizziness, near syncope or syncope Reports that his PMD does labs every 96mo    Device information MDT dual chamber PPM implanted 11/11/2008   Past Medical History:  Diagnosis Date   CAD (coronary artery disease)    The patient presented in Aug 2008 with acute cornary syndrome. He did have a bypass surgeru. He did have a 3-vessel disease on heart cathiterization and had bypass surgery with LIMA to the LAD, sequential vein graft to the obtuse marginal - 1 and obtuse marginal -2, and vein graft to the RCA. Adenosine myoview (11/10): EF 64%, normal wall motion, normal perfusion.     Cholelithiasis 12/07/2016   GERD (gastroesophageal reflux disease)    History of echocardiogram    November 2008. EF 65-70%. no regional wall abnormalities. Trivial mitral regurgitation.   HTN (hypertension)    Hyperlipidemia    Mild anemia    Hx of post op. 8/08   Paroxysmal atrial fibrillation (HCC)    only episode noted breifly during hospitalization for PCM placement 11/10.    Sick sinus syndrome (HCC)    with presyncope. medtronic dual chamber PCM placed 11/10   Type II or unspecified type diabetes mellitus without mention of complication, not stated as uncontrolled     Past Surgical History:  Procedure Laterality Date   CORONARY ARTERY BYPASS GRAFT     PACEMAKER INSERTION     MDT implanted 2010   rectal abcess     2005. Dr. Carolynne Edouard with interanal sphincterotomy    Current Outpatient Medications  Medication Sig Dispense Refill   amLODipine (NORVASC) 5 MG tablet Take 1 tablet (5 mg total) by mouth daily. 90 tablet 3   aspirin 81 MG tablet Take 1 tablet (81 mg total) by mouth daily.     atorvastatin (LIPITOR) 40 MG tablet TAKE 1 TABLET BY MOUTH DAILY AT 6 PM 90 tablet 3   Cholecalciferol (THERA-D 2000) 50 MCG (2000 UT) TABS 1 tab by mouth once per day 90 tablet 99   ezetimibe (ZETIA) 10 MG tablet Take 1 tablet (10 mg  total) by mouth daily. 90 tablet 3   fenofibrate (TRICOR) 145 MG tablet 1 tab by mouth once daily 90 tablet 3   glipiZIDE (GLUCOTROL XL) 10 MG 24 hr tablet TAKE 1 TABLET(10 MG) BY MOUTH DAILY WITH BREAKFAST 90 tablet 3   lisinopril (ZESTRIL) 40 MG tablet TAKE 1 TABLET BY MOUTH EVERY DAY 90 tablet 3   loratadine (CLARITIN) 10 MG tablet Take 1 tablet (10 mg total) by mouth daily. 30 tablet 2   meloxicam (MOBIC) 15 MG tablet TAKE 1 TABLET BY MOUTH DAILY 90 tablet 1   metoprolol tartrate (LOPRESSOR) 25 MG tablet Take 1 tablet (25 mg total) by mouth 2 (two) times daily. 180 tablet 3   Omega-3 Fatty Acids (OMEGA-3 FISH OIL) 1200 MG CAPS Take 1 capsule by mouth 2 (two)  times daily.     omeprazole (PRILOSEC) 20 MG capsule TAKE ONE CAPSULE BY MOUTH TWICE DAILY BEFORE A MEAL 180 capsule 3   pioglitazone (ACTOS) 15 MG tablet Take 1 tablet (15 mg total) by mouth daily. 90 tablet 3   triamcinolone cream (KENALOG) 0.1 % Apply 1 application topically 2 (two) times daily. As needed for rash or itch 80 g 3   nitroGLYCERIN (NITROSTAT) 0.4 MG SL tablet Place 1 tablet (0.4 mg total) under the tongue every 5 (five) minutes as needed for chest pain. 15 tablet 3   No current facility-administered medications for this visit.    Allergies:   Metformin and related   Social History:  The patient  reports that he quit smoking about 14 years ago. His smoking use included cigarettes. He has a 20.00 pack-year smoking history. He has never used smokeless tobacco. He reports current alcohol use of about 2.0 standard drinks per week. He reports that he does not use drugs.   Family History:  The patient's Family history is unknown by patient.  ROS:  Please see the history of present illness.    All other systems are reviewed and otherwise negative.   PHYSICAL EXAM:  VS:  BP 140/60   Pulse 78   Ht 5\' 4"  (1.626 m)   Wt 182 lb (82.6 kg)   SpO2 97%   BMI 31.24 kg/m  BMI: Body mass index is 31.24 kg/m. Well nourished, well developed, in no acute distress HEENT: normocephalic, atraumatic Neck: no JVD, carotid bruits or masses Cardiac:  RRR; no significant murmurs, no rubs, or gallops Lungs:  CTA b/l, no wheezing, rhonchi or rales Abd: soft, nontender MS: no deformity or atrophy Ext: no edema Skin: warm and dry, no rash Neuro:  No gross deficits appreciated Psych: euthymic mood, full affect  PPM site is stable, no tethering or discomfort   EKG:  Done today and reviewed by myself shows  AP/VS, 78bpm  Device interrogation done today and reviewed by myself:  Battery and lead measurements are good 41 HAR episodes Longest 3:62min back in Feb 2022, no EGM is available Some  have EGMs, others only markers I suspect some A lead noise by available tracings Provocative testing today notes some reproducible though only momentary A lead noise with isometrics/vigorous hand rubbing and not every time He AP 69.8%, VP 0.5% P waves 1.4-2.35mV and sensitivity was adjusted to 0.26mV   11/11/2008: TTE  Study Conclusions   1. Left ventricle: The cavity size was normal. Wall thickness was      normal. Systolic function was normal. The estimated ejection      fraction was in the range of 60% to 65%.  2. Mitral valve: Calcified annulus. Mildly thickened leaflets   Recent Labs: 06/02/2020: ALT 13; BUN 23; Creatinine, Ser 1.22; Hemoglobin 13.6; Platelets 240.0; Potassium 4.1; Sodium 138; TSH 3.21  12/25/2019: LDL Cholesterol 53 06/02/2020: Cholesterol 127; Direct LDL 53.0; HDL 48.00; Total CHOL/HDL Ratio 3; Triglycerides 212.0; VLDL 42.4   CrCl cannot be calculated (Patient's most recent lab result is older than the maximum 21 days allowed.).   Wt Readings from Last 3 Encounters:  11/13/20 182 lb (82.6 kg)  06/02/20 177 lb (80.3 kg)  02/12/20 175 lb 6.4 oz (79.6 kg)     Other studies reviewed: Additional studies/records reviewed today include: summarized above  ASSESSMENT AND PLAN:  PPM As discussed above  CAD No anginal symptoms On ASA, statin/zetia, BB Due to see Dr. Servando Salina  Paroxysmal Afib 0.2 % burden Suspect some if this is probably false Continue to monitor burden, no OAC at this juncture  HTN Could be a bit better, follow, no changes today  HLD Monitored and managed with his PMD  Disposition: F/u with remotes as usual, in clinic with EP in 1 year, sooner if needed  Current medicines are reviewed at length with the patient today.  The patient did not have any concerns regarding medicines.  Norma Fredrickson, PA-C 11/13/2020 10:45 AM     [facility] [address] [place] [address] [phone] (office)  [phone] (fax)

## 2020-11-12 ENCOUNTER — Encounter: Payer: Medicare Other | Admitting: Physician Assistant

## 2020-11-13 ENCOUNTER — Encounter: Payer: Self-pay | Admitting: Physician Assistant

## 2020-11-13 ENCOUNTER — Ambulatory Visit: Payer: Medicare Other | Admitting: Physician Assistant

## 2020-11-13 ENCOUNTER — Other Ambulatory Visit: Payer: Self-pay

## 2020-11-13 VITALS — BP 140/60 | HR 78 | Ht 64.0 in | Wt 182.0 lb

## 2020-11-13 DIAGNOSIS — I480 Paroxysmal atrial fibrillation: Secondary | ICD-10-CM | POA: Diagnosis not present

## 2020-11-13 DIAGNOSIS — E785 Hyperlipidemia, unspecified: Secondary | ICD-10-CM

## 2020-11-13 DIAGNOSIS — I495 Sick sinus syndrome: Secondary | ICD-10-CM | POA: Diagnosis not present

## 2020-11-13 DIAGNOSIS — I10 Essential (primary) hypertension: Secondary | ICD-10-CM

## 2020-11-13 DIAGNOSIS — I2510 Atherosclerotic heart disease of native coronary artery without angina pectoris: Secondary | ICD-10-CM

## 2020-11-13 DIAGNOSIS — Z950 Presence of cardiac pacemaker: Secondary | ICD-10-CM

## 2020-11-13 LAB — CUP PACEART INCLINIC DEVICE CHECK
Battery Impedance: 2027 Ohm
Battery Remaining Longevity: 35 mo
Battery Voltage: 2.76 V
Brady Statistic AP VP Percent: 0 %
Brady Statistic AP VS Percent: 69 %
Brady Statistic AS VP Percent: 0 %
Brady Statistic AS VS Percent: 30 %
Date Time Interrogation Session: 20221103105447
Implantable Lead Implant Date: 20101101
Implantable Lead Implant Date: 20101101
Implantable Lead Location: 753859
Implantable Lead Location: 753860
Implantable Lead Model: 5076
Implantable Lead Model: 5076
Implantable Pulse Generator Implant Date: 20101101
Lead Channel Impedance Value: 501 Ohm
Lead Channel Impedance Value: 507 Ohm
Lead Channel Pacing Threshold Amplitude: 0.625 V
Lead Channel Pacing Threshold Amplitude: 0.75 V
Lead Channel Pacing Threshold Amplitude: 1 V
Lead Channel Pacing Threshold Amplitude: 1.125 V
Lead Channel Pacing Threshold Pulse Width: 0.4 ms
Lead Channel Pacing Threshold Pulse Width: 0.4 ms
Lead Channel Pacing Threshold Pulse Width: 0.4 ms
Lead Channel Pacing Threshold Pulse Width: 0.4 ms
Lead Channel Sensing Intrinsic Amplitude: 1.4 mV
Lead Channel Sensing Intrinsic Amplitude: 11.2 mV
Lead Channel Setting Pacing Amplitude: 2 V
Lead Channel Setting Pacing Amplitude: 2.5 V
Lead Channel Setting Pacing Pulse Width: 0.4 ms
Lead Channel Setting Sensing Sensitivity: 5.6 mV

## 2020-11-13 NOTE — Patient Instructions (Addendum)
Medication Instructions:   Your physician recommends that you continue on your current medications as directed. Please refer to the Current Medication list given to you today.   *If you need a refill on your cardiac medications before your next appointment, please call your pharmacy*   Lab Work:  NONE ORDERED  TODAY   If you have labs (blood work) drawn today and your tests are completely normal, you will receive your results only by: MyChart Message (if you have MyChart) OR A paper copy in the mail If you have any lab test that is abnormal or we need to change your treatment, we will call you to review the results.   Testing/Procedures: NONE ORDERED  TODAY    Follow-Up: At [facility], you and your health needs are our priority.  As part of our continuing mission to provide you with exceptional heart care, we have created designated Provider Care Teams.  These Care Teams include your primary Cardiologist (physician) and Advanced Practice Providers (APPs -  Physician Assistants and Nurse Practitioners) who all work together to provide you with the care you need, when you need it.  We recommend signing up for the patient portal called "MyChart".  Sign up information is provided on this After Visit Summary.  MyChart is used to connect with patients for Virtual Visits (Telemedicine).  Patients are able to view lab/test results, encounter notes, upcoming appointments, etc.  Non-urgent messages can be sent to your provider as well.   To learn more about what you can do with MyChart, go to [email or web address].    Your next appointment:  WITH DR TOBB ( CHECK RECALL IN EPIC ) OR NEXT ASAP AVAILABILITY    1 year(s)  The format for your next appointment:   In Person  Provider:   You may see  the following Advanced Practice Providers on your designated Care Team:   Francis Dowse, [place]    Other Instructions

## 2020-11-19 ENCOUNTER — Other Ambulatory Visit: Payer: Self-pay | Admitting: Internal Medicine

## 2020-11-26 ENCOUNTER — Other Ambulatory Visit: Payer: Self-pay

## 2020-11-26 ENCOUNTER — Encounter: Payer: Self-pay | Admitting: Cardiology

## 2020-11-26 ENCOUNTER — Ambulatory Visit: Payer: Medicare Other | Admitting: Cardiology

## 2020-11-26 VITALS — BP 132/66 | HR 72 | Ht 64.0 in | Wt 179.2 lb

## 2020-11-26 DIAGNOSIS — I10 Essential (primary) hypertension: Secondary | ICD-10-CM | POA: Diagnosis not present

## 2020-11-26 DIAGNOSIS — I119 Hypertensive heart disease without heart failure: Secondary | ICD-10-CM | POA: Diagnosis not present

## 2020-11-26 DIAGNOSIS — I480 Paroxysmal atrial fibrillation: Secondary | ICD-10-CM

## 2020-11-26 DIAGNOSIS — E0800 Diabetes mellitus due to underlying condition with hyperosmolarity without nonketotic hyperglycemic-hyperosmolar coma (NKHHC): Secondary | ICD-10-CM

## 2020-11-26 DIAGNOSIS — Z950 Presence of cardiac pacemaker: Secondary | ICD-10-CM

## 2020-11-26 DIAGNOSIS — I495 Sick sinus syndrome: Secondary | ICD-10-CM | POA: Diagnosis not present

## 2020-11-26 DIAGNOSIS — I2510 Atherosclerotic heart disease of native coronary artery without angina pectoris: Secondary | ICD-10-CM | POA: Diagnosis not present

## 2020-11-26 NOTE — Patient Instructions (Signed)

## 2020-11-26 NOTE — Progress Notes (Signed)
Cardiology Office Note:    Date:  11/26/2020   ID:  [name] [name], DOB 1955-08-24, MRN [id number]  PCP:  Corwin Levins, MD  Cardiologist:  Thomasene Ripple, DO  Electrophysiologist:  Hillis Range, MD   Referring MD: Corwin Levins, MD   " I am doing well"  History of Present Illness:    [name] [name] is a 65 y.o. male with a hx of coronary artery disease, status post coronary artery bypass grafting in 2008 with LIMA to LAD, SVG to OM1 and OM 2, SVG to RCA, symptomatic bradycardia/sick sinus syndrome status post pacemaker,  hypertension, postoperative paroxysmal atrial fibrillation with a very low burden previous discussion with the patient no anticoagulation, hyperlipidemia, diabetes mellitus.  I last saw the patient in November 2021 at that time he had no angina symptoms were experiencing likely we will send him for testing as well as referral to PVD team and at this time no plan for any further intervention.  He was recently seen by the EP team on November 13, 2020. Since I last saw the patient has been doing well.  He had retired from his full-time job.  No chest pain, no shortness of breath, no lightheadedness no dizziness.  Past Medical History:  Diagnosis Date   CAD (coronary artery disease)    The patient presented in Aug 2008 with acute cornary syndrome. He did have a bypass surgeru. He did have a 3-vessel disease on heart cathiterization and had bypass surgery with LIMA to the LAD, sequential vein graft to the obtuse marginal - 1 and obtuse marginal -2, and vein graft to the RCA. Adenosine myoview (11/10): EF 64%, normal wall motion, normal perfusion.    Cholelithiasis 12/07/2016   GERD (gastroesophageal reflux disease)    History of echocardiogram    November 2008. EF 65-70%. no regional wall abnormalities. Trivial mitral regurgitation.   HTN (hypertension)    Hyperlipidemia    Mild anemia    Hx of post op. 8/08   Paroxysmal atrial fibrillation (HCC)    only episode noted breifly  during hospitalization for PCM placement 11/10.    Sick sinus syndrome (HCC)    with presyncope. medtronic dual chamber PCM placed 11/10   Type II or unspecified type diabetes mellitus without mention of complication, not stated as uncontrolled     Past Surgical History:  Procedure Laterality Date   CORONARY ARTERY BYPASS GRAFT     PACEMAKER INSERTION     MDT implanted 2010   rectal abcess     2005. Dr. Carolynne Edouard with interanal sphincterotomy    Current Medications: Current Meds  Medication Sig   amLODipine (NORVASC) 5 MG tablet Take 1 tablet (5 mg total) by mouth daily.   aspirin 81 MG tablet Take 1 tablet (81 mg total) by mouth daily.   atorvastatin (LIPITOR) 40 MG tablet TAKE 1 TABLET BY MOUTH DAILY AT 6 PM   Cholecalciferol (THERA-D 2000) 50 MCG (2000 UT) TABS 1 tab by mouth once per day   ezetimibe (ZETIA) 10 MG tablet Take 1 tablet (10 mg total) by mouth daily.   fenofibrate (TRICOR) 145 MG tablet 1 tab by mouth once daily   glipiZIDE (GLUCOTROL XL) 10 MG 24 hr tablet TAKE 1 TABLET(10 MG) BY MOUTH DAILY WITH BREAKFAST   lisinopril (ZESTRIL) 40 MG tablet TAKE 1 TABLET BY MOUTH EVERY DAY   loratadine (CLARITIN) 10 MG tablet Take 1 tablet (10 mg total) by mouth daily.   meloxicam (MOBIC) 15 MG  tablet TAKE 1 TABLET BY MOUTH DAILY   metoprolol tartrate (LOPRESSOR) 25 MG tablet Take 1 tablet (25 mg total) by mouth 2 (two) times daily.   nitroGLYCERIN (NITROSTAT) 0.4 MG SL tablet Place 1 tablet (0.4 mg total) under the tongue every 5 (five) minutes as needed for chest pain.   Omega-3 Fatty Acids (OMEGA-3 FISH OIL) 1200 MG CAPS Take 1 capsule by mouth 2 (two) times daily.   omeprazole (PRILOSEC) 20 MG capsule TAKE ONE CAPSULE BY MOUTH TWICE DAILY BEFORE A MEAL   pioglitazone (ACTOS) 15 MG tablet Take 1 tablet (15 mg total) by mouth daily.   triamcinolone cream (KENALOG) 0.1 % Apply 1 application topically 2 (two) times daily. As needed for rash or itch     Allergies:   Metformin and  related   Social History   Socioeconomic History   Marital status: Married    Spouse name: Not on file   Number of children: Not on file   Years of education: Not on file   Highest education level: Not on file  Occupational History   Not on file  Tobacco Use   Smoking status: Former    Packs/day: 1.00    Years: 20.00    Pack years: 20.00    Types: Cigarettes    Quit date: 01/11/2006    Years since quitting: 14.8   Smokeless tobacco: Never  Vaping Use   Vaping Use: Never used  Substance and Sexual Activity   Alcohol use: Yes    Alcohol/week: 2.0 standard drinks    Types: 2 Cans of beer per week    Comment: social on weekends   Drug use: No   Sexual activity: Not on file  Other Topics Concern   Not on file  Social History Narrative   Married, 3 children. To [place] from [place] 1979. Work- sign spray pain- Conservation officer, historic buildings   Social Determinants of Radio broadcast assistant Strain: Not on file  Food Insecurity: Not on file  Transportation Needs: Not on file  Physical Activity: Not on file  Stress: Not on file  Social Connections: Not on file     Family History: The patient's Family history is unknown by patient.  ROS:   Review of Systems  Constitution: Negative for decreased appetite, fever and weight gain.  HENT: Negative for congestion, ear discharge, hoarse voice and sore throat.   Eyes: Negative for discharge, redness, vision loss in right eye and visual halos.  Cardiovascular: Negative for chest pain, dyspnea on exertion, leg swelling, orthopnea and palpitations.  Respiratory: Negative for cough, hemoptysis, shortness of breath and snoring.   Endocrine: Negative for heat intolerance and polyphagia.  Hematologic/Lymphatic: Negative for bleeding problem. Does not bruise/bleed easily.  Skin: Negative for flushing, nail changes, rash and suspicious lesions.  Musculoskeletal: Negative for arthritis, joint pain, muscle cramps, myalgias, neck pain and stiffness.   Gastrointestinal: Negative for abdominal pain, bowel incontinence, diarrhea and excessive appetite.  Genitourinary: Negative for decreased libido, genital sores and incomplete emptying.  Neurological: Negative for brief paralysis, focal weakness, headaches and loss of balance.  Psychiatric/Behavioral: Negative for altered mental status, depression and suicidal ideas.  Allergic/Immunologic: Negative for HIV exposure and persistent infections.    EKGs/Labs/Other Studies Reviewed:    The following studies were reviewed today:   EKG:  None today  Vascular ABI December 12, 2019 Summary:  Right: Resting right ankle-brachial index is within normal range. No  evidence of significant right lower extremity arterial disease. The right  toe-brachial index is abnormal.  Left: Resting left ankle-brachial index indicates mild left lower  extremity arterial disease. The left toe-brachial index is abnormal.    Recent Labs: 06/02/2020: ALT 13; BUN 23; Creatinine, Ser 1.22; Hemoglobin 13.6; Platelets 240.0; Potassium 4.1; Sodium 138; TSH 3.21  Recent Lipid Panel    Component Value Date/Time   CHOL 127 06/02/2020 1104   TRIG 212.0 (H) 06/02/2020 1104   HDL 48.00 06/02/2020 1104   CHOLHDL 3 06/02/2020 1104   VLDL 42.4 (H) 06/02/2020 1104   LDLCALC 53 12/25/2019 1639   LDLDIRECT 53.0 06/02/2020 1104    Physical Exam:    VS:  BP 132/66 (BP Location: Left Arm, Patient Position: Sitting, Cuff Size: Normal)   Pulse 72   Ht 5\' 4"  (1.626 m)   Wt 179 lb 3.2 oz (81.3 kg)   SpO2 98%   BMI 30.76 kg/m     Wt Readings from Last 3 Encounters:  11/26/20 179 lb 3.2 oz (81.3 kg)  11/13/20 182 lb (82.6 kg)  06/02/20 177 lb (80.3 kg)     GEN: Well nourished, well developed in no acute distress HEENT: Normal NECK: No JVD; No carotid bruits LYMPHATICS: No lymphadenopathy CARDIAC: S1S2 noted,RRR, no murmurs, rubs, gallops RESPIRATORY:  Clear to auscultation without rales, wheezing or rhonchi   ABDOMEN: Soft, non-tender, non-distended, +bowel sounds, no guarding. EXTREMITIES: No edema, No cyanosis, no clubbing MUSCULOSKELETAL:  No deformity  SKIN: Warm and dry NEUROLOGIC:  Alert and oriented x 3, non-focal PSYCHIATRIC:  Normal affect, good insight  ASSESSMENT:    1. Coronary artery disease involving native coronary artery of native heart without angina pectoris   2. Hypertensive heart disease without heart failure   3. Sick sinus syndrome (HCC)   4. Essential hypertension   5. Paroxysmal atrial fibrillation (HCC)   6. Diabetes mellitus due to underlying condition with hyperosmolarity without coma, without long-term current use of insulin (HCC)   7. Pacemaker-Medtronic    PLAN:     He is not experiencing any symptoms today.  He denies any anginal symptoms as well as symptoms that may suggest angina equivalent.  I will continue the patient on his current medication regimen for his coronary artery disease.  In terms of his hyperlipidemia no need for any changes in his medication regimen at this time.  Blood pressure is acceptable, continue with current antihypertensive regimen.  Diabetes mellitus-his most recent hemoglobin A1c is 7.6.  This is being managed by the primary care doctor.  The patient understands the need to lose weight with diet and exercise. We have discussed specific strategies for this.  In terms of his paroxysmal atrial fibrillation he has deferred anticoagulation for now.  We will continue to monitor.  The patient is in agreement with the above plan. The patient left the office in stable condition.  The patient will follow up in 1 year or sooner if needed.  The patient also followed by EP colleagues.   Medication Adjustments/Labs and Tests Ordered: Current medicines are reviewed at length with the patient today.  Concerns regarding medicines are outlined above.  No orders of the defined types were placed in this encounter.  No orders of the defined  types were placed in this encounter.   Patient Instructions  Medication Instructions:  Your physician recommends that you continue on your current medications as directed. Please refer to the Current Medication list given to you today.  *If you need a refill on your cardiac medications before your next appointment,  please call your pharmacy*   Lab Work: None If you have labs (blood work) drawn today and your tests are completely normal, you will receive your results only by: [place] (if you have MyChart) OR A paper copy in the mail If you have any lab test that is abnormal or we need to change your treatment, we will call you to review the results.   Testing/Procedures: None   Follow-Up: At [facility], you and your health needs are our priority.  As part of our continuing mission to provide you with exceptional heart care, we have created designated Provider Care Teams.  These Care Teams include your primary Cardiologist (physician) and Advanced Practice Providers (APPs -  Physician Assistants and Nurse Practitioners) who all work together to provide you with the care you need, when you need it.  We recommend signing up for the patient portal called "MyChart".  Sign up information is provided on this After Visit Summary.  MyChart is used to connect with patients for Virtual Visits (Telemedicine).  Patients are able to view lab/test results, encounter notes, upcoming appointments, etc.  Non-urgent messages can be sent to your provider as well.   To learn more about what you can do with MyChart, go to [email or web address].    Your next appointment:   1 year(s)  The format for your next appointment:   In Person  Provider:   Berniece Salines, DO     Other Instructions     Adopting a Healthy Lifestyle.  Know what a healthy weight is for you (roughly BMI <25) and aim to maintain this   Aim for 7+ servings of fruits and vegetables daily   65-80+ fluid ounces of water  or unsweet tea for healthy kidneys   Limit to max 1 drink of alcohol per day; avoid smoking/tobacco   Limit animal fats in diet for cholesterol and heart health - choose grass fed whenever available   Avoid highly processed foods, and foods high in saturated/trans fats   Aim for low stress - take time to unwind and care for your mental health   Aim for 150 min of moderate intensity exercise weekly for heart health, and weights twice weekly for bone health   Aim for 7-9 hours of sleep daily   When it comes to diets, agreement about the perfect plan isnt easy to find, even among the experts. Experts at the [employer] developed an idea known as the Healthy Eating Plate. Just imagine a plate divided into logical, healthy portions.   The emphasis is on diet quality:   Load up on vegetables and fruits - one-half of your plate: Aim for color and variety, and remember that potatoes dont count.   Go for whole grains - one-quarter of your plate: Whole wheat, barley, wheat berries, quinoa, oats, brown rice, and foods made with them. If you want pasta, go with whole wheat pasta.   Protein power - one-quarter of your plate: Fish, chicken, beans, and nuts are all healthy, versatile protein sources. Limit red meat.   The diet, however, does go beyond the plate, offering a few other suggestions.   Use healthy plant oils, such as olive, canola, soy, corn, sunflower and peanut. Check the labels, and avoid partially hydrogenated oil, which have unhealthy trans fats.   If youre thirsty, drink water. Coffee and tea are good in moderation, but skip sugary drinks and limit milk and dairy products to one or two daily servings.  The type of carbohydrate in the diet is more important than the amount. Some sources of carbohydrates, such as vegetables, fruits, whole grains, and beans-are healthier than others.   Finally, stay active  Signed, Berniece Salines, DO  11/26/2020 10:59 AM     [facility]

## 2020-12-01 ENCOUNTER — Encounter: Payer: Medicare Other | Admitting: Student

## 2020-12-03 ENCOUNTER — Ambulatory Visit: Payer: Medicare Other | Admitting: Internal Medicine

## 2020-12-10 ENCOUNTER — Other Ambulatory Visit: Payer: Self-pay

## 2020-12-10 ENCOUNTER — Ambulatory Visit (INDEPENDENT_AMBULATORY_CARE_PROVIDER_SITE_OTHER): Payer: Medicare Other | Admitting: Internal Medicine

## 2020-12-10 ENCOUNTER — Encounter: Payer: Self-pay | Admitting: Internal Medicine

## 2020-12-10 VITALS — BP 122/58 | HR 76 | Temp 98.6°F | Ht 64.0 in | Wt 182.0 lb

## 2020-12-10 DIAGNOSIS — I10 Essential (primary) hypertension: Secondary | ICD-10-CM

## 2020-12-10 DIAGNOSIS — E7800 Pure hypercholesterolemia, unspecified: Secondary | ICD-10-CM

## 2020-12-10 DIAGNOSIS — Z23 Encounter for immunization: Secondary | ICD-10-CM

## 2020-12-10 DIAGNOSIS — E1165 Type 2 diabetes mellitus with hyperglycemia: Secondary | ICD-10-CM | POA: Diagnosis not present

## 2020-12-10 DIAGNOSIS — E559 Vitamin D deficiency, unspecified: Secondary | ICD-10-CM | POA: Diagnosis not present

## 2020-12-10 LAB — LIPID PANEL
Cholesterol: 132 mg/dL (ref 0–200)
HDL: 62.6 mg/dL (ref >39.00)
NonHDL: 69.87
Total CHOL/HDL Ratio: 2
Triglycerides: 245 mg/dL — ABNORMAL HIGH (ref 0.0–149.0)
VLDL: 49 mg/dL — ABNORMAL HIGH (ref 0.0–40.0)

## 2020-12-10 LAB — HEMOGLOBIN A1C: Hgb A1c MFr Bld: 6.5 % (ref 4.6–6.5)

## 2020-12-10 LAB — HEPATIC FUNCTION PANEL
ALT: 13 U/L (ref 0–53)
AST: 15 U/L (ref 0–37)
Albumin: 4.4 g/dL (ref 3.5–5.2)
Alkaline Phosphatase: 37 U/L — ABNORMAL LOW (ref 39–117)
Bilirubin, Direct: 0.1 mg/dL (ref 0.0–0.3)
Total Bilirubin: 0.5 mg/dL (ref 0.2–1.2)
Total Protein: 7.2 g/dL (ref 6.0–8.3)

## 2020-12-10 LAB — BASIC METABOLIC PANEL
BUN: 25 mg/dL — ABNORMAL HIGH (ref 6–23)
CO2: 27 mEq/L (ref 19–32)
Calcium: 10 mg/dL (ref 8.4–10.5)
Chloride: 103 mEq/L (ref 96–112)
Creatinine, Ser: 1.4 mg/dL (ref 0.40–1.50)
GFR: 52.26 mL/min — ABNORMAL LOW (ref >60.00)
Glucose, Bld: 153 mg/dL — ABNORMAL HIGH (ref 70–99)
Potassium: 4.4 mEq/L (ref 3.5–5.1)
Sodium: 137 mEq/L (ref 135–145)

## 2020-12-10 LAB — VITAMIN D 25 HYDROXY (VIT D DEFICIENCY, FRACTURES): VITD: 41.54 ng/mL (ref 30.00–100.00)

## 2020-12-10 LAB — LDL CHOLESTEROL, DIRECT: Direct LDL: 55 mg/dL

## 2020-12-10 MED ORDER — PIOGLITAZONE HCL 15 MG PO TABS: 15.0000 mg | ORAL_TABLET | Freq: Every day | ORAL | 3 refills | Status: DC

## 2020-12-10 NOTE — Progress Notes (Signed)
Patient ID: [name] [name], male   DOB: 01-Jul-1955, 65 y.o.   MRN: [id number]        Chief Complaint: follow up HTN, HLD and hyperglycemia       HPI:  [name] is a 65 y.o. male here overall doing ok.  Pt denies chest pain, increased sob or doe, wheezing, orthopnea, PND, increased Kindall swelling, palpitations, dizziness or syncope.   Pt denies polydipsia, polyuria, or low sugar symptoms .  Pt not sure if he is taking the new actos from last visit, just cant recall but thinks he takes everything on the list.   Pt denies fever, wt loss, night sweats, loss of appetite, or other constitutional symptoms  Not taking Vit d.   Has eye appt for next week.   No other new complaints except plans to work harder on better diet and wt loss.  Due for prevnar 20 Wt Readings from Last 3 Encounters:  12/10/20 182 lb (82.6 kg)  11/26/20 179 lb 3.2 oz (81.3 kg)  11/13/20 182 lb (82.6 kg)   BP Readings from Last 3 Encounters:  12/10/20 (!) 122/58  11/26/20 132/66  11/13/20 140/60         Past Medical History:  Diagnosis Date   CAD (coronary artery disease)    The patient presented in Aug 2008 with acute cornary syndrome. He did have a bypass surgeru. He did have a 3-vessel disease on heart cathiterization and had bypass surgery with LIMA to the LAD, sequential vein graft to the obtuse marginal - 1 and obtuse marginal -2, and vein graft to the RCA. Adenosine myoview (11/10): EF 64%, normal wall motion, normal perfusion.    Cholelithiasis 12/07/2016   GERD (gastroesophageal reflux disease)    History of echocardiogram    November 2008. EF 65-70%. no regional wall abnormalities. Trivial mitral regurgitation.   HTN (hypertension)    Hyperlipidemia    Mild anemia    Hx of post op. 8/08   Paroxysmal atrial fibrillation (HCC)    only episode noted breifly during hospitalization for PCM placement 11/10.    Sick sinus syndrome (HCC)    with presyncope. medtronic dual chamber PCM placed 11/10   Type II or unspecified type  diabetes mellitus without mention of complication, not stated as uncontrolled    Past Surgical History:  Procedure Laterality Date   CORONARY ARTERY BYPASS GRAFT     PACEMAKER INSERTION     MDT implanted 2010   rectal abcess     2005. Dr. Carolynne Edouard with interanal sphincterotomy    reports that he quit smoking about 14 years ago. His smoking use included cigarettes. He has a 20.00 pack-year smoking history. He has never used smokeless tobacco. He reports current alcohol use of about 2.0 standard drinks per week. He reports that he does not use drugs. Family history is unknown by patient. Allergies  Allergen Reactions   Metformin And Related     rash   Current Outpatient Medications on File Prior to Visit  Medication Sig Dispense Refill   amLODipine (NORVASC) 5 MG tablet Take 1 tablet (5 mg total) by mouth daily. 90 tablet 3   aspirin 81 MG tablet Take 1 tablet (81 mg total) by mouth daily.     atorvastatin (LIPITOR) 40 MG tablet TAKE 1 TABLET BY MOUTH DAILY AT 6 PM 90 tablet 3   Cholecalciferol (THERA-D 2000) 50 MCG (2000 UT) TABS 1 tab by mouth once per day 90 tablet 99   fenofibrate (  TRICOR) 145 MG tablet 1 tab by mouth once daily 90 tablet 3   glipiZIDE (GLUCOTROL XL) 10 MG 24 hr tablet TAKE 1 TABLET(10 MG) BY MOUTH DAILY WITH BREAKFAST 90 tablet 3   lisinopril (ZESTRIL) 40 MG tablet TAKE 1 TABLET BY MOUTH EVERY DAY 90 tablet 3   loratadine (CLARITIN) 10 MG tablet Take 1 tablet (10 mg total) by mouth daily. 30 tablet 2   meloxicam (MOBIC) 15 MG tablet TAKE 1 TABLET BY MOUTH DAILY 90 tablet 1   metoprolol tartrate (LOPRESSOR) 25 MG tablet Take 1 tablet (25 mg total) by mouth 2 (two) times daily. 180 tablet 3   Omega-3 Fatty Acids (OMEGA-3 FISH OIL) 1200 MG CAPS Take 1 capsule by mouth 2 (two) times daily.     omeprazole (PRILOSEC) 20 MG capsule TAKE ONE CAPSULE BY MOUTH TWICE DAILY BEFORE A MEAL 180 capsule 3   triamcinolone cream (KENALOG) 0.1 % Apply 1 application topically 2 (two) times  daily. As needed for rash or itch 80 g 3   ezetimibe (ZETIA) 10 MG tablet Take 1 tablet (10 mg total) by mouth daily. 90 tablet 3   nitroGLYCERIN (NITROSTAT) 0.4 MG SL tablet Place 1 tablet (0.4 mg total) under the tongue every 5 (five) minutes as needed for chest pain. 15 tablet 3   No current facility-administered medications on file prior to visit.        ROS:  All others reviewed and negative.  Objective        PE:  BP (!) 122/58 (BP Location: Right Arm, Patient Position: Sitting, Cuff Size: Large)   Pulse 76   Temp 98.6 F (37 C) (Oral)   Ht 5\' 4"  (1.626 m)   Wt 182 lb (82.6 kg)   SpO2 94%   BMI 31.24 kg/m                 Constitutional: Pt appears in NAD               HENT: Head: NCAT.                Right Ear: External ear normal.                 Left Ear: External ear normal.                Eyes: . Pupils are equal, round, and reactive to light. Conjunctivae and EOM are normal               Nose: without d/c or deformity               Neck: Neck supple. Gross normal ROM               Cardiovascular: Normal rate and regular rhythm.                 Pulmonary/Chest: Effort normal and breath sounds without rales or wheezing.                Abd:  Soft, NT, ND, + BS, no organomegaly               Neurological: Pt is alert. At baseline orientation, motor grossly intact               Skin: Skin is warm. No rashes, no other new lesions, Hottel edema - none               Psychiatric: Pt behavior is normal without agitation  Micro: none  Cardiac tracings I have personally interpreted today:  none  Pertinent Radiological findings (summarize): none   Lab Results  Component Value Date   WBC 6.7 06/02/2020   HGB 13.6 06/02/2020   HCT 41.1 06/02/2020   PLT 240.0 06/02/2020   GLUCOSE 143 (H) 06/02/2020   CHOL 127 06/02/2020   TRIG 212.0 (H) 06/02/2020   HDL 48.00 06/02/2020   LDLDIRECT 53.0 06/02/2020   LDLCALC 53 12/25/2019   ALT 13 06/02/2020   AST 14 06/02/2020   NA 138  06/02/2020   K 4.1 06/02/2020   CL 103 06/02/2020   CREATININE 1.22 06/02/2020   BUN 23 06/02/2020   CO2 25 06/02/2020   TSH 3.21 06/02/2020   PSA 0.34 06/02/2020   INR 0.94 11/10/2008   HGBA1C 7.6 (H) 06/02/2020   MICROALBUR <0.7 06/02/2020   Assessment/Plan:  GRANVEL PROUDFOOT is a 65 y.o. Asian [4] male with  has a past medical history of CAD (coronary artery disease), Cholelithiasis (12/07/2016), GERD (gastroesophageal reflux disease), History of echocardiogram, HTN (hypertension), Hyperlipidemia, Mild anemia, Paroxysmal atrial fibrillation (HCC), Sick sinus syndrome (HCC), and Type II or unspecified type diabetes mellitus without mention of complication, not stated as uncontrolled.  Vitamin D deficiency Last vitamin D Lab Results  Component Value Date   VD25OH 24.99 (L) 06/02/2020   Low, to start oral replacement   Diabetes (HCC) Lab Results  Component Value Date   HGBA1C 7.6 (H) 06/02/2020   Uncontrolled, goal a1c < 7, ok to add actos 15 qd,, pt to continue other medical treatment glucotrol, DM diet and f/u lab     Hyperlipidemia Lab Results  Component Value Date   LDLCALC 53 12/25/2019   Stable, pt to continue current statin lipitor 40   Essential hypertension BP Readings from Last 3 Encounters:  12/10/20 (!) 122/58  11/26/20 132/66  11/13/20 140/60   Stable, pt to continue medical treatment lisinopril, amlodipine, wt control, low salt, excercise  Followup: Return in about 6 months (around 06/09/2021).  Oliver Barre, MD 12/11/2020 4:44 AM [facility]

## 2020-12-10 NOTE — Assessment & Plan Note (Signed)
Last vitamin D Lab Results  Component Value Date   VD25OH 24.99 (L) 06/02/2020   Low, to start oral replacement

## 2020-12-10 NOTE — Patient Instructions (Addendum)
You had the Prevnar 20 pneumonia shot today  Please be sure to take the actos 15 mg per day, as well as try a different brand of the Vitamin D3 at 2000 units per day  Please continue all other medications as before, and refills have been done if requested.  Please have the pharmacy call with any other refills you may need.  Please continue your efforts at being more active, low cholesterol diet, and weight control  Please keep your appointments with your specialists as you may have planned  Please go to the LAB at the blood drawing area for the tests to be done  You will be contacted by phone if any changes need to be made immediately.  Otherwise, you will receive a letter about your results with an explanation, but please check with MyChart first.  Please remember to sign up for MyChart if you have not done so, as this will be important to you in the future with finding out test results, communicating by private email, and scheduling acute appointments online when needed.  Please make an Appointment to return in 6 months, or sooner if needed

## 2020-12-11 ENCOUNTER — Encounter: Payer: Self-pay | Admitting: Internal Medicine

## 2020-12-11 NOTE — Assessment & Plan Note (Signed)
Lab Results  Component Value Date   LDLCALC 53 12/25/2019   Stable, pt to continue current statin lipitor 40

## 2020-12-11 NOTE — Assessment & Plan Note (Signed)
Lab Results  Component Value Date   HGBA1C 7.6 (H) 06/02/2020   Uncontrolled, goal a1c < 7, ok to add actos 15 qd,, pt to continue other medical treatment glucotrol, DM diet and f/u lab

## 2020-12-11 NOTE — Assessment & Plan Note (Signed)
BP Readings from Last 3 Encounters:  12/10/20 (!) 122/58  11/26/20 132/66  11/13/20 140/60   Stable, pt to continue medical treatment lisinopril, amlodipine, wt control, low salt, excercise

## 2020-12-24 ENCOUNTER — Ambulatory Visit (HOSPITAL_COMMUNITY)
Admission: RE | Admit: 2020-12-24 | Payer: Medicare Other | Source: Ambulatory Visit | Attending: Cardiovascular Disease | Admitting: Cardiovascular Disease

## 2021-03-04 ENCOUNTER — Encounter (HOSPITAL_BASED_OUTPATIENT_CLINIC_OR_DEPARTMENT_OTHER): Payer: Self-pay | Admitting: Emergency Medicine

## 2021-03-04 DIAGNOSIS — K219 Gastro-esophageal reflux disease without esophagitis: Secondary | ICD-10-CM | POA: Diagnosis not present

## 2021-03-04 DIAGNOSIS — R079 Chest pain, unspecified: Secondary | ICD-10-CM | POA: Diagnosis present

## 2021-03-04 DIAGNOSIS — R0789 Other chest pain: Secondary | ICD-10-CM | POA: Diagnosis not present

## 2021-03-04 DIAGNOSIS — Z950 Presence of cardiac pacemaker: Secondary | ICD-10-CM | POA: Insufficient documentation

## 2021-03-04 DIAGNOSIS — Z951 Presence of aortocoronary bypass graft: Secondary | ICD-10-CM | POA: Insufficient documentation

## 2021-03-04 DIAGNOSIS — E119 Type 2 diabetes mellitus without complications: Secondary | ICD-10-CM | POA: Insufficient documentation

## 2021-03-04 DIAGNOSIS — Z7984 Long term (current) use of oral hypoglycemic drugs: Secondary | ICD-10-CM | POA: Diagnosis not present

## 2021-03-04 DIAGNOSIS — I10 Essential (primary) hypertension: Secondary | ICD-10-CM | POA: Diagnosis not present

## 2021-03-04 DIAGNOSIS — Z79899 Other long term (current) drug therapy: Secondary | ICD-10-CM | POA: Insufficient documentation

## 2021-03-04 DIAGNOSIS — I2510 Atherosclerotic heart disease of native coronary artery without angina pectoris: Secondary | ICD-10-CM | POA: Insufficient documentation

## 2021-03-04 DIAGNOSIS — Z87891 Personal history of nicotine dependence: Secondary | ICD-10-CM | POA: Insufficient documentation

## 2021-03-04 DIAGNOSIS — Z7982 Long term (current) use of aspirin: Secondary | ICD-10-CM | POA: Insufficient documentation

## 2021-03-04 DIAGNOSIS — Z20822 Contact with and (suspected) exposure to covid-19: Secondary | ICD-10-CM | POA: Diagnosis not present

## 2021-03-04 NOTE — ED Triage Notes (Signed)
Pt states feels dizzy and fatigued, and his chest feel tight.

## 2021-03-05 ENCOUNTER — Emergency Department (HOSPITAL_BASED_OUTPATIENT_CLINIC_OR_DEPARTMENT_OTHER): Payer: Medicare (Managed Care)

## 2021-03-05 ENCOUNTER — Emergency Department (HOSPITAL_BASED_OUTPATIENT_CLINIC_OR_DEPARTMENT_OTHER)
Admission: EM | Admit: 2021-03-05 | Discharge: 2021-03-05 | Disposition: A | Discharge status: Home / Self Care | Payer: Medicare (Managed Care) | Attending: Emergency Medicine | Admitting: Emergency Medicine

## 2021-03-05 DIAGNOSIS — R0789 Other chest pain: Secondary | ICD-10-CM

## 2021-03-05 LAB — CBC
HCT: 38.7 % — ABNORMAL LOW (ref 39.0–52.0)
Hemoglobin: 12.8 g/dL — ABNORMAL LOW (ref 13.0–17.0)
MCH: 30.2 pg (ref 26.0–34.0)
MCHC: 33.1 g/dL (ref 30.0–36.0)
MCV: 91.3 fL (ref 80.0–100.0)
Platelets: 228 10*3/uL (ref 150–400)
RBC: 4.24 MIL/uL (ref 4.22–5.81)
RDW: 12.1 % (ref 11.5–15.5)
WBC: 7.5 10*3/uL (ref 4.0–10.5)
nRBC: 0 % (ref 0.0–0.2)

## 2021-03-05 LAB — RESP PANEL BY RT-PCR (FLU A&B, COVID) ARPGX2
Influenza A by PCR: NEGATIVE
Influenza B by PCR: NEGATIVE
SARS Coronavirus 2 by RT PCR: NEGATIVE

## 2021-03-05 LAB — BASIC METABOLIC PANEL
Anion gap: 9 (ref 5–15)
BUN: 32 mg/dL — ABNORMAL HIGH (ref 8–23)
CO2: 22 mmol/L (ref 22–32)
Calcium: 9.8 mg/dL (ref 8.9–10.3)
Chloride: 100 mmol/L (ref 98–111)
Creatinine, Ser: 1.43 mg/dL — ABNORMAL HIGH (ref 0.61–1.24)
GFR, Estimated: 54 mL/min — ABNORMAL LOW (ref >60)
Glucose, Bld: 57 mg/dL — ABNORMAL LOW (ref 70–99)
Potassium: 4.1 mmol/L (ref 3.5–5.1)
Sodium: 131 mmol/L — ABNORMAL LOW (ref 135–145)

## 2021-03-05 LAB — TROPONIN I (HIGH SENSITIVITY)
Troponin I (High Sensitivity): 10 ng/L (ref <18)
Troponin I (High Sensitivity): 8 ng/L (ref <18)

## 2021-03-05 NOTE — ED Provider Notes (Signed)
[facility] Provider Note: [name] Dell, MD, FACEP  CSN: [id number] MRN: [id number] ARRIVAL: 03/04/21 at 2350 ROOM: [facility]07/[facility]07   CHIEF COMPLAINT  Chest Pain   HISTORY OF PRESENT ILLNESS  03/05/21 2:33 AM [name] [name] is a 66 y.o. male with coronary artery disease status post CABG. about 10 PM yesterday evening he felt sweaty and fatigued.  About 10:30 PM, while he was lying down, he developed tightness across his chest.  This was not severe and nothing made it better or worse.  He had no associated shortness of breath or nausea.  The tightness only lasted a few minutes before it resolved.  He is asymptomatic at the present time.   Past Medical History:  Diagnosis Date   CAD (coronary artery disease)    The patient presented in Aug 2008 with acute cornary syndrome. He did have a bypass surgeru. He did have a 3-vessel disease on heart cathiterization and had bypass surgery with LIMA to the LAD, sequential vein graft to the obtuse marginal - 1 and obtuse marginal -2, and vein graft to the RCA. Adenosine myoview (11/10): EF 64%, normal wall motion, normal perfusion.    Cholelithiasis 12/07/2016   GERD (gastroesophageal reflux disease)    History of echocardiogram    November 2008. EF 65-70%. no regional wall abnormalities. Trivial mitral regurgitation.   HTN (hypertension)    Hyperlipidemia    Mild anemia    Hx of post op. 8/08   Paroxysmal atrial fibrillation (HCC)    only episode noted breifly during hospitalization for PCM placement 11/10.    Sick sinus syndrome (HCC)    with presyncope. medtronic dual chamber PCM placed 11/10   Type II or unspecified type diabetes mellitus without mention of complication, not stated as uncontrolled     Past Surgical History:  Procedure Laterality Date   CORONARY ARTERY BYPASS GRAFT     PACEMAKER INSERTION     MDT implanted 2010   rectal abcess     2005. Dr. Carolynne Edouard with interanal sphincterotomy    Family History  Family history  unknown: Yes    Social History   Tobacco Use   Smoking status: Former    Packs/day: 1.00    Years: 20.00    Pack years: 20.00    Types: Cigarettes    Quit date: 01/11/2006    Years since quitting: 15.1   Smokeless tobacco: Never  Vaping Use   Vaping Use: Never used  Substance Use Topics   Alcohol use: Yes    Alcohol/week: 2.0 standard drinks    Types: 2 Cans of beer per week    Comment: social on weekends   Drug use: No    Prior to Admission medications   Medication Sig Start Date End Date Taking? Authorizing Provider  amLODipine (NORVASC) 5 MG tablet Take 1 tablet (5 mg total) by mouth daily. 06/02/20   Corwin Levins, MD  aspirin 81 MG tablet Take 1 tablet (81 mg total) by mouth daily. 10/15/13   Allred, Fayrene Fearing, MD  atorvastatin (LIPITOR) 40 MG tablet TAKE 1 TABLET BY MOUTH DAILY AT 6 PM 06/02/20   Corwin Levins, MD  Cholecalciferol (THERA-D 2000) 50 MCG (2000 UT) TABS 1 tab by mouth once per day 06/06/20   Corwin Levins, MD  ezetimibe (ZETIA) 10 MG tablet Take 1 tablet (10 mg total) by mouth daily. 06/02/20 11/26/20  Corwin Levins, MD  fenofibrate (TRICOR) 145 MG tablet 1 tab by mouth once daily  06/02/20   Corwin Levins, MD  glipiZIDE (GLUCOTROL XL) 10 MG 24 hr tablet TAKE 1 TABLET(10 MG) BY MOUTH DAILY WITH BREAKFAST 06/02/20   Corwin Levins, MD  lisinopril (ZESTRIL) 40 MG tablet TAKE 1 TABLET BY MOUTH EVERY DAY 07/21/20   Corwin Levins, MD  loratadine (CLARITIN) 10 MG tablet Take 1 tablet (10 mg total) by mouth daily. 05/04/16   Plotnikov, Georgina Quint, MD  meloxicam (MOBIC) 15 MG tablet TAKE 1 TABLET BY MOUTH DAILY 11/19/20   Corwin Levins, MD  metoprolol tartrate (LOPRESSOR) 25 MG tablet Take 1 tablet (25 mg total) by mouth 2 (two) times daily. 06/02/20   Corwin Levins, MD  nitroGLYCERIN (NITROSTAT) 0.4 MG SL tablet Place 1 tablet (0.4 mg total) under the tongue every 5 (five) minutes as needed for chest pain. 11/17/18 11/26/20  Alver Sorrow, NP  Omega-3 Fatty Acids (OMEGA-3 FISH OIL)  1200 MG CAPS Take 1 capsule by mouth 2 (two) times daily.    [provider]  omeprazole (PRILOSEC) 20 MG capsule TAKE ONE CAPSULE BY MOUTH TWICE DAILY BEFORE A MEAL 06/02/20   Corwin Levins, MD  pioglitazone (ACTOS) 15 MG tablet Take 1 tablet (15 mg total) by mouth daily. 12/10/20   Corwin Levins, MD  triamcinolone cream (KENALOG) 0.1 % Apply 1 application topically 2 (two) times daily. As needed for rash or itch 06/02/20 06/02/21  Corwin Levins, MD    Allergies Metformin and related   REVIEW OF SYSTEMS  Negative except as noted here or in the History of Present Illness.   PHYSICAL EXAMINATION  Initial Vital Signs Blood pressure 140/72, pulse 66, temperature 98.6 F (37 C), temperature source Oral, resp. rate 16, height 5\' 4"  (1.626 m), weight 79.4 kg, SpO2 94 %.  Examination General: Well-developed, well-nourished male in no acute distress; appearance consistent with age of record HENT: normocephalic; atraumatic Eyes: pupils equal, round and reactive to light; extraocular muscles intact Neck: supple Heart: regular rate and rhythm; no murmurs, rubs or gallops Lungs: clear to auscultation bilaterally Abdomen: soft; nondistended; nontender; no masses or hepatosplenomegaly; bowel sounds present Extremities: No deformity; full range of motion; pulses normal Neurologic: Awake, alert and oriented; motor function intact in all extremities and symmetric; no facial droop Skin: Warm and dry Psychiatric: Normal mood and affect   RESULTS  Summary of this visit's results, reviewed and interpreted by myself:   EKG Interpretation  Date/Time:  Thursday March 05 2021 00:01:03 EST Ventricular Rate:  86 PR Interval:  138 QRS Duration: 100 QT Interval:  362 QTC Calculation: 433 R Axis:   39 Text Interpretation: Normal sinus rhythm Left ventricular hypertrophy with repolarization abnormality Abnormal ECG  Previously AFIB with RVR Confirmed by [id number] (Paula Libra) on 03/05/2021  12:18:06 AM       Laboratory Studies: Results for orders placed or performed during the hospital encounter of 03/05/21 (from the past 24 hour(s))  Basic metabolic panel     Status: Abnormal   Collection Time: 03/05/21 12:04 AM  Result Value Ref Range   Sodium 131 (L) 135 - 145 mmol/L   Potassium 4.1 3.5 - 5.1 mmol/L   Chloride 100 98 - 111 mmol/L   CO2 22 22 - 32 mmol/L   Glucose, Bld 57 (L) 70 - 99 mg/dL   BUN 32 (H) 8 - 23 mg/dL   Creatinine, Ser 03/07/21 (H) 0.61 - 1.24 mg/dL   Calcium 9.8 8.9 - 2.35 mg/dL   GFR, Estimated  54 (L) >60 mL/min   Anion gap 9 5 - 15  CBC     Status: Abnormal   Collection Time: 03/05/21 12:04 AM  Result Value Ref Range   WBC 7.5 4.0 - 10.5 K/uL   RBC 4.24 4.22 - 5.81 MIL/uL   Hemoglobin 12.8 (L) 13.0 - 17.0 g/dL   HCT 29.9 (L) 37.1 - 69.6 %   MCV 91.3 80.0 - 100.0 fL   MCH 30.2 26.0 - 34.0 pg   MCHC 33.1 30.0 - 36.0 g/dL   RDW 78.9 38.1 - 01.7 %   Platelets 228 150 - 400 K/uL   nRBC 0.0 0.0 - 0.2 %  Troponin I (High Sensitivity)     Status: None   Collection Time: 03/05/21 12:04 AM  Result Value Ref Range   Troponin I (High Sensitivity) 8 <18 ng/L  Resp Panel by RT-PCR (Flu A&B, Covid) Nasopharyngeal Swab     Status: None   Collection Time: 03/05/21  1:23 AM   Specimen: Nasopharyngeal Swab; Nasopharyngeal(NP) swabs in vial transport medium  Result Value Ref Range   SARS Coronavirus 2 by RT PCR NEGATIVE NEGATIVE   Influenza A by PCR NEGATIVE NEGATIVE   Influenza B by PCR NEGATIVE NEGATIVE  Troponin I (High Sensitivity)     Status: None   Collection Time: 03/05/21  2:09 AM  Result Value Ref Range   Troponin I (High Sensitivity) 10 <18 ng/L   Imaging Studies: DG Chest 2 View  Result Date: 03/05/2021 CLINICAL DATA:  Dizziness and fatigue. EXAM: CHEST - 2 VIEW COMPARISON:  September 03, 2016 FINDINGS: Multiple sternal wires and vascular clips are seen. There is a dual lead AICD. The heart size and mediastinal contours are within normal limits.  Both lungs are clear. The visualized skeletal structures are unremarkable. IMPRESSION: 1. Evidence of prior median sternotomy/CABG. 2. No acute or active cardiopulmonary disease. Electronically Signed   By: Aram Candela M.D.   On: 03/05/2021 00:16    ED COURSE and MDM  Nursing notes, initial and subsequent vitals signs, including pulse oximetry, reviewed and interpreted by myself.  Vitals:   03/05/21 0145 03/05/21 0200 03/05/21 0215 03/05/21 0230  BP: (!) 149/65 (!) 146/69 (!) 149/64 140/72  Pulse: 73 76 76 66  Resp:    16  Temp:      TempSrc:      SpO2: 100% 94% 99% 94%  Weight:      Height:       Medications - No data to display  3:00 AM The patient has known coronary artery disease and other risk factors and has an elevated HEART score of 6.  His EKG shows ST and T wave changes that are consistent with those seen on previous EKGs so I do not believe these represent any acute changes.  His troponins are well within normal limits.  His symptoms were brief and atypical and he is asymptomatic.  He is comfortable being discharged home at this time for follow-up with his cardiologist  PROCEDURES  Procedures   ED DIAGNOSES     ICD-10-CM   1. Atypical chest pain  R07.89          Paula Libra, MD 03/05/21 [phone]

## 2021-03-05 NOTE — ED Notes (Signed)
Patient verbalizes understanding of discharge instructions. Opportunity for questioning and answers were provided. Armband removed by staff, pt discharged from ED. Ambulated out to lobby  

## 2021-03-11 ENCOUNTER — Encounter: Payer: Self-pay | Admitting: Gastroenterology

## 2021-03-26 ENCOUNTER — Encounter: Payer: Self-pay | Admitting: Internal Medicine

## 2021-03-26 ENCOUNTER — Ambulatory Visit (INDEPENDENT_AMBULATORY_CARE_PROVIDER_SITE_OTHER): Payer: Medicare (Managed Care) | Admitting: Internal Medicine

## 2021-03-26 ENCOUNTER — Other Ambulatory Visit: Payer: Self-pay

## 2021-03-26 VITALS — BP 114/60 | HR 78 | Ht 64.0 in | Wt 181.6 lb

## 2021-03-26 DIAGNOSIS — Z7184 Encounter for health counseling related to travel: Secondary | ICD-10-CM

## 2021-03-26 DIAGNOSIS — E7800 Pure hypercholesterolemia, unspecified: Secondary | ICD-10-CM | POA: Diagnosis not present

## 2021-03-26 DIAGNOSIS — E0800 Diabetes mellitus due to underlying condition with hyperosmolarity without nonketotic hyperglycemic-hyperosmolar coma (NKHHC): Secondary | ICD-10-CM | POA: Diagnosis not present

## 2021-03-26 DIAGNOSIS — Z0000 Encounter for general adult medical examination without abnormal findings: Secondary | ICD-10-CM | POA: Diagnosis not present

## 2021-03-26 DIAGNOSIS — E559 Vitamin D deficiency, unspecified: Secondary | ICD-10-CM | POA: Diagnosis not present

## 2021-03-26 DIAGNOSIS — I10 Essential (primary) hypertension: Secondary | ICD-10-CM

## 2021-03-26 DIAGNOSIS — Z0001 Encounter for general adult medical examination with abnormal findings: Secondary | ICD-10-CM

## 2021-03-26 MED ORDER — CIPROFLOXACIN HCL 500 MG PO TABS: 500.0000 mg | ORAL_TABLET | Freq: Two times a day (BID) | ORAL | 0 refills | Status: AC

## 2021-03-26 MED ORDER — GLIPIZIDE ER 5 MG PO TB24: 5.0000 mg | ORAL_TABLET | Freq: Every day | ORAL | 3 refills | Status: DC

## 2021-03-26 NOTE — Patient Instructions (Signed)
Please take all new medication as prescribed - the cipro antibiotic for diarrhea if needed in [place] ? ?Ok to decresae the glipizide ER to 5 mg per day ? ? Please continue all other medications as before, and refills have been done if requested. ? ?Please have the pharmacy call with any other refills you may need. ? ?Please continue your efforts at being more active, low cholesterol diet, and weight control. ? ?You are otherwise up to date with prevention measures today. ? ?Please keep your appointments with your specialists as you may have planned ? ?Please make an Appointment to return in 6 months, or sooner if needed, also with Lab Appointment for testing done 3-5 days before at the FIRST FLOOR Lab (so this is for TWO appointments - please see the scheduling desk as you leave) ? ?Due to the ongoing Covid 19 pandemic, our lab now requires an appointment for any labs done at our office.  If you need labs done and do not have an appointment, please call our office ahead of time to schedule before presenting to the lab for your testing. ? ? ?

## 2021-03-26 NOTE — Progress Notes (Signed)
Patient ID: [name] [name], male   DOB: [birth year], 66 y.o.   MRN: [id number] ? ? ? ?     Chief Complaint:: wellness exam and dm, low vit d, travel medicine, htn, hld ? ?     HPI:  [name] [name] is a 66 y.o. male here for wellness exam; declines covid booster, shingrix ow up to date ?              Also Pt denies chest pain, increased sob or doe, wheezing, orthopnea, PND, increased [place] swelling, palpitations, dizziness or syncope.  Pt denies polydipsia, polyuria, or new focal neuro s/s, But has had multiple low sugars recently with good med and diet complaince; hard to lose wt with low sugar reactions.    Pt denies fever, wt loss, night sweats, loss of appetite, or other constitutional symptoms  Plans to travel to [place] in 1 mo, asks for cipro prn traveler diarrhea since has heppened in the past.  Taking Vit D. ?  ?Wt Readings from Last 3 Encounters:  ?03/26/21 181 lb 9.6 oz (82.4 kg)  ?03/04/21 175 lb (79.4 kg)  ?12/10/20 182 lb (82.6 kg)  ? ?BP Readings from Last 3 Encounters:  ?03/26/21 114/60  ?03/05/21 (!) 164/78  ?12/10/20 (!) 122/58  ? ?Immunization History  ?Administered Date(s) Administered  ? Influenza Inj Mdck Quad Pf 09/30/2017  ? Influenza Split 10/27/2020  ? Influenza Whole 11/11/2008  ? Influenza,inj,Quad PF,6+ Mos 10/22/2016, 10/17/2018  ? Influenza-Unspecified 09/11/2013, 10/11/2016, 10/15/2019  ? PFIZER(Purple Top)SARS-COV-2 Vaccination 03/09/2019, 04/04/2019, 11/16/2019  ? PNEUMOCOCCAL CONJUGATE-20 12/10/2020  ? Pneumococcal Polysaccharide-23 08/12/2006, 05/01/2014  ? Td 01/11/2001  ? Tdap 06/08/2017  ? ?There are no preventive care reminders to display for this patient. ? ?  ? ?Past Medical History:  ?Diagnosis Date  ? CAD (coronary artery disease)   ? The patient presented in Aug 2008 with acute cornary syndrome. He did have a bypass surgeru. He did have a 3-vessel disease on heart cathiterization and had bypass surgery with LIMA to the LAD, sequential vein graft to the obtuse marginal - 1 and obtuse  marginal -2, and vein graft to the RCA. Adenosine myoview (11/10): EF 64%, normal wall motion, normal perfusion.   ? Cholelithiasis 12/07/2016  ? GERD (gastroesophageal reflux disease)   ? History of echocardiogram   ? November 2008. EF 65-70%. no regional wall abnormalities. Trivial mitral regurgitation.  ? HTN (hypertension)   ? Hyperlipidemia   ? Mild anemia   ? Hx of post op. 8/08  ? Paroxysmal atrial fibrillation (HCC)   ? only episode noted breifly during hospitalization for PCM placement 11/10.   ? Sick sinus syndrome ([place])   ? with presyncope. medtronic dual chamber PCM placed 11/10  ? Type II or unspecified type diabetes mellitus without mention of complication, not stated as uncontrolled   ? ?Past Surgical History:  ?Procedure Laterality Date  ? CORONARY ARTERY BYPASS GRAFT    ? PACEMAKER INSERTION    ? MDT implanted 2010  ? rectal abcess    ? 2005. Dr. Marlou Starks with interanal sphincterotomy  ? ? reports that he quit smoking about 15 years ago. His smoking use included cigarettes. He has a 20.00 pack-year smoking history. He has never used smokeless tobacco. He reports current alcohol use of about 2.0 standard drinks per week. He reports that he does not use drugs. ?Family history is unknown by patient. ?Allergies  ?Allergen Reactions  ? Metformin And Related   ?  rash  ? ?  Current Outpatient Medications on File Prior to Visit  ?Medication Sig Dispense Refill  ? aspirin 81 MG tablet Take 1 tablet (81 mg total) by mouth daily.    ? Cholecalciferol (THERA-D 2000) 50 MCG (2000 UT) TABS 1 tab by mouth once per day 90 tablet 99  ? loratadine (CLARITIN) 10 MG tablet Take 1 tablet (10 mg total) by mouth daily. 30 tablet 2  ? Omega-3 Fatty Acids (OMEGA-3 FISH OIL) 1200 MG CAPS Take 1 capsule by mouth 2 (two) times daily.    ? nitroGLYCERIN (NITROSTAT) 0.4 MG SL tablet Place 1 tablet (0.4 mg total) under the tongue every 5 (five) minutes as needed for chest pain. 15 tablet 3  ? ?No current facility-administered  medications on file prior to visit.  ? ?     ROS:  All others reviewed and negative. ? ?Objective  ? ?     PE:  BP 114/60 (BP Location: Left Arm, Patient Position: Sitting, Cuff Size: Large)   Pulse 78   Ht 5\' 4"  (1.626 m)   Wt 181 lb 9.6 oz (82.4 kg)   SpO2 98%   BMI 31.17 kg/m?  ? ?              Constitutional: Pt appears in NAD ?              HENT: Head: NCAT.  ?              Right Ear: External ear normal.   ?              Left Ear: External ear normal.  ?              Eyes: . Pupils are equal, round, and reactive to light. Conjunctivae and EOM are normal ?              Nose: without d/c or deformity ?              Neck: Neck supple. Gross normal ROM ?              Cardiovascular: Normal rate and regular rhythm.   ?              Pulmonary/Chest: Effort normal and breath sounds without rales or wheezing.  ?              Abd:  Soft, NT, ND, + BS, no organomegaly ?              Neurological: Pt is alert. At baseline orientation, motor grossly intact ?              Skin: Skin is warm. No rashes, no other new lesions, Breth edema - none ?              Psychiatric: Pt behavior is normal without agitation  ? ?Micro: none ? ?Cardiac tracings I have personally interpreted today:  none ? ?Pertinent Radiological findings (summarize): none  ? ?Lab Results  ?Component Value Date  ? WBC 7.5 03/05/2021  ? HGB 12.8 (L) 03/05/2021  ? HCT 38.7 (L) 03/05/2021  ? PLT 228 03/05/2021  ? GLUCOSE 57 (L) 03/05/2021  ? CHOL 132 12/10/2020  ? TRIG 245.0 (H) 12/10/2020  ? HDL 62.60 12/10/2020  ? LDLDIRECT 55.0 12/10/2020  ? [place] 53 12/25/2019  ? ALT 13 12/10/2020  ? AST 15 12/10/2020  ? NA 131 (L) 03/05/2021  ? K 4.1 03/05/2021  ? CL 100 03/05/2021  ? CREATININE  1.43 (H) 03/05/2021  ? BUN 32 (H) 03/05/2021  ? CO2 22 03/05/2021  ? TSH 3.21 06/02/2020  ? PSA 0.34 06/02/2020  ? INR 0.94 11/10/2008  ? HGBA1C 6.5 12/10/2020  ? MICROALBUR <0.7 06/02/2020  ? ?Assessment/Plan:  ?[name] [name] is a 66 y.o. Asian [4] male with  has a past medical  history of CAD (coronary artery disease), Cholelithiasis (12/07/2016), GERD (gastroesophageal reflux disease), History of echocardiogram, HTN (hypertension), Hyperlipidemia, Mild anemia, Paroxysmal atrial fibrillation ([place]), Sick sinus syndrome ([place]), and Type II or unspecified type diabetes mellitus without mention of complication, not stated as uncontrolled. ? ?Encounter for well adult exam with abnormal findings ?Age and sex appropriate education and counseling updated with regular exercise and diet ?Referrals for preventative services - none needed ?Immunizations addressed - decliens covid booster, shingrix ?Smoking counseling  - none needed ?Evidence for depression or other mood disorder - none significant ?Most recent labs reviewed. ?I have personally reviewed and have noted: ?1) the patient's medical and social history ?2) The patient's current medications and supplements ?3) The patient's height, weight, and BMI have been recorded in the chart ? ? ?Vitamin D deficiency ?Last vitamin D ?Lab Results  ?Component Value Date  ? VD25OH 41.54 12/10/2020  ? ?Stable, cont oral replacement ? ? ?Hyperlipidemia ?Lab Results  ?Component Value Date  ? [place] 53 12/25/2019  ? ?Stable, pt to continue current statin tricor ? ? ?Essential hypertension ?BP Readings from Last 3 Encounters:  ?03/26/21 114/60  ?03/05/21 (!) 164/78  ?12/10/20 (!) 122/58  ? ?Stable, pt to continue medical treatment norvasc, lisinopril, lopressor ? ? ?Diabetes ([place]) ?Lab Results  ?Component Value Date  ? HGBA1C 6.5 12/10/2020  ? ?overcontrolled with multiple symptomatic low sugars, pt to continue current medical treatment except to decrease the glipizide XL to 5 mg ? ? ?Travel advice encounter ?Also for cipro preventive rx today, ? ?Followup: Return in about 6 months (around 09/26/2021). ? ?Cathlean Cower, MD 03/31/2021 7:07 PM ?[place] ?[place] ?Internal Medicine ?

## 2021-03-31 ENCOUNTER — Encounter: Payer: Self-pay | Admitting: Internal Medicine

## 2021-03-31 DIAGNOSIS — Z7184 Encounter for health counseling related to travel: Secondary | ICD-10-CM | POA: Insufficient documentation

## 2021-03-31 MED ORDER — MELOXICAM 15 MG PO TABS
15.0000 mg | ORAL_TABLET | Freq: Every day | ORAL | 3 refills | Status: DC | PRN
Start: 2021-03-31 — End: 2021-10-01

## 2021-03-31 MED ORDER — TRIAMCINOLONE ACETONIDE 0.1 % EX CREA: 1.0000 "application " | TOPICAL_CREAM | Freq: Two times a day (BID) | CUTANEOUS | 3 refills | Status: AC

## 2021-03-31 MED ORDER — METOPROLOL TARTRATE 25 MG PO TABS: 25.0000 mg | ORAL_TABLET | Freq: Two times a day (BID) | ORAL | 3 refills | Status: DC

## 2021-03-31 MED ORDER — FENOFIBRATE 145 MG PO TABS: ORAL_TABLET | ORAL | 3 refills | Status: DC

## 2021-03-31 MED ORDER — AMLODIPINE BESYLATE 5 MG PO TABS
5.0000 mg | ORAL_TABLET | Freq: Every day | ORAL | 3 refills | Status: DC
Start: 2021-03-31 — End: 2022-04-01

## 2021-03-31 MED ORDER — OMEPRAZOLE 20 MG PO CPDR
DELAYED_RELEASE_CAPSULE | ORAL | 3 refills | Status: DC
Start: 2021-03-31 — End: 2022-04-01

## 2021-03-31 MED ORDER — EZETIMIBE 10 MG PO TABS: 10.0000 mg | ORAL_TABLET | Freq: Every day | ORAL | 3 refills | Status: DC

## 2021-03-31 MED ORDER — ATORVASTATIN CALCIUM 40 MG PO TABS: ORAL_TABLET | ORAL | 3 refills | Status: DC

## 2021-03-31 MED ORDER — LISINOPRIL 40 MG PO TABS
40.0000 mg | ORAL_TABLET | Freq: Every day | ORAL | 3 refills | Status: DC
Start: 2021-03-31 — End: 2022-04-01

## 2021-03-31 MED ORDER — PIOGLITAZONE HCL 15 MG PO TABS
15.0000 mg | ORAL_TABLET | Freq: Every day | ORAL | 3 refills | Status: DC
Start: 2021-03-31 — End: 2021-10-01

## 2021-03-31 NOTE — Assessment & Plan Note (Signed)
Lab Results  ?Component Value Date  ? LDLCALC 53 12/25/2019  ? ?Stable, pt to continue current statin tricor ? ?

## 2021-03-31 NOTE — Assessment & Plan Note (Signed)
Also for cipro preventive rx today, ?

## 2021-03-31 NOTE — Assessment & Plan Note (Signed)
BP Readings from Last 3 Encounters:  ?03/26/21 114/60  ?03/05/21 (!) 164/78  ?12/10/20 (!) 122/58  ? ?Stable, pt to continue medical treatment norvasc, lisinopril, lopressor ? ?

## 2021-03-31 NOTE — Assessment & Plan Note (Signed)
Age and sex appropriate education and counseling updated with regular exercise and diet Referrals for preventative services - none needed Immunizations addressed - decliens covid booster, shingrix Smoking counseling  - none needed Evidence for depression or other mood disorder - none significant Most recent labs reviewed. I have personally reviewed and have noted: 1) the patient's medical and social history 2) The patient's current medications and supplements 3) The patient's height, weight, and BMI have been recorded in the chart  

## 2021-03-31 NOTE — Assessment & Plan Note (Signed)
Lab Results  ?Component Value Date  ? HGBA1C 6.5 12/10/2020  ? ?overcontrolled with multiple symptomatic low sugars, pt to continue current medical treatment except to decrease the glipizide XL to 5 mg ? ?

## 2021-03-31 NOTE — Assessment & Plan Note (Signed)
Last vitamin D ?Lab Results  ?Component Value Date  ? VD25OH 41.54 12/10/2020  ? ?Stable, cont oral replacement ? ?

## 2021-06-10 ENCOUNTER — Ambulatory Visit: Payer: Medicare Other | Admitting: Internal Medicine

## 2021-08-12 ENCOUNTER — Ambulatory Visit (INDEPENDENT_AMBULATORY_CARE_PROVIDER_SITE_OTHER): Payer: Medicare (Managed Care)

## 2021-08-12 DIAGNOSIS — I495 Sick sinus syndrome: Secondary | ICD-10-CM | POA: Diagnosis not present

## 2021-08-13 LAB — CUP PACEART REMOTE DEVICE CHECK
Battery Impedance: 2180 Ohm
Battery Remaining Longevity: 34 mo
Battery Voltage: 2.75 V
Brady Statistic AP VP Percent: 0 %
Brady Statistic AP VS Percent: 63 %
Brady Statistic AS VP Percent: 0 %
Brady Statistic AS VS Percent: 36 %
Date Time Interrogation Session: 20230803132040
Implantable Lead Implant Date: 20101101
Implantable Lead Implant Date: 20101101
Implantable Lead Location: 753859
Implantable Lead Location: 753860
Implantable Lead Model: 5076
Implantable Lead Model: 5076
Implantable Pulse Generator Implant Date: 20101101
Lead Channel Impedance Value: 507 Ohm
Lead Channel Impedance Value: 509 Ohm
Lead Channel Pacing Threshold Amplitude: 0.625 V
Lead Channel Pacing Threshold Amplitude: 1 V
Lead Channel Pacing Threshold Pulse Width: 0.4 ms
Lead Channel Pacing Threshold Pulse Width: 0.4 ms
Lead Channel Setting Pacing Amplitude: 2 V
Lead Channel Setting Pacing Amplitude: 2.5 V
Lead Channel Setting Pacing Pulse Width: 0.4 ms
Lead Channel Setting Sensing Sensitivity: 4 mV

## 2021-09-04 NOTE — Progress Notes (Signed)
Remote pacemaker transmission.   

## 2021-09-24 ENCOUNTER — Other Ambulatory Visit (INDEPENDENT_AMBULATORY_CARE_PROVIDER_SITE_OTHER): Payer: Medicare (Managed Care)

## 2021-09-24 DIAGNOSIS — E559 Vitamin D deficiency, unspecified: Secondary | ICD-10-CM | POA: Diagnosis not present

## 2021-09-24 DIAGNOSIS — E0800 Diabetes mellitus due to underlying condition with hyperosmolarity without nonketotic hyperglycemic-hyperosmolar coma (NKHHC): Secondary | ICD-10-CM | POA: Diagnosis not present

## 2021-09-24 LAB — HEPATIC FUNCTION PANEL
ALT: 15 U/L (ref 0–53)
AST: 18 U/L (ref 0–37)
Albumin: 4.2 g/dL (ref 3.5–5.2)
Alkaline Phosphatase: 30 U/L — ABNORMAL LOW (ref 39–117)
Bilirubin, Direct: 0.1 mg/dL (ref 0.0–0.3)
Total Bilirubin: 0.5 mg/dL (ref 0.2–1.2)
Total Protein: 7.3 g/dL (ref 6.0–8.3)

## 2021-09-24 LAB — BASIC METABOLIC PANEL
BUN: 42 mg/dL — ABNORMAL HIGH (ref 6–23)
CO2: 29 mEq/L (ref 19–32)
Calcium: 10.3 mg/dL (ref 8.4–10.5)
Chloride: 101 mEq/L (ref 96–112)
Creatinine, Ser: 1.88 mg/dL — ABNORMAL HIGH (ref 0.40–1.50)
GFR: 36.48 mL/min — ABNORMAL LOW (ref >60.00)
Glucose, Bld: 91 mg/dL (ref 70–99)
Potassium: 4.9 mEq/L (ref 3.5–5.1)
Sodium: 137 mEq/L (ref 135–145)

## 2021-09-24 LAB — VITAMIN D 25 HYDROXY (VIT D DEFICIENCY, FRACTURES): VITD: 42.2 ng/mL (ref 30.00–100.00)

## 2021-09-24 LAB — LIPID PANEL
Cholesterol: 123 mg/dL (ref 0–200)
HDL: 54.9 mg/dL (ref >39.00)
LDL Cholesterol: 48 mg/dL (ref 0–99)
NonHDL: 68.31
Total CHOL/HDL Ratio: 2
Triglycerides: 103 mg/dL (ref 0.0–149.0)
VLDL: 20.6 mg/dL (ref 0.0–40.0)

## 2021-09-24 LAB — HEMOGLOBIN A1C: Hgb A1c MFr Bld: 7.3 % — ABNORMAL HIGH (ref 4.6–6.5)

## 2021-10-01 ENCOUNTER — Encounter: Payer: Self-pay | Admitting: Internal Medicine

## 2021-10-01 ENCOUNTER — Ambulatory Visit (INDEPENDENT_AMBULATORY_CARE_PROVIDER_SITE_OTHER): Payer: Medicare (Managed Care) | Admitting: Internal Medicine

## 2021-10-01 VITALS — BP 122/60 | HR 67 | Temp 97.9°F | Ht 64.0 in | Wt 184.0 lb

## 2021-10-01 DIAGNOSIS — N179 Acute kidney failure, unspecified: Secondary | ICD-10-CM | POA: Diagnosis not present

## 2021-10-01 DIAGNOSIS — E559 Vitamin D deficiency, unspecified: Secondary | ICD-10-CM

## 2021-10-01 DIAGNOSIS — E7800 Pure hypercholesterolemia, unspecified: Secondary | ICD-10-CM

## 2021-10-01 DIAGNOSIS — E0800 Diabetes mellitus due to underlying condition with hyperosmolarity without nonketotic hyperglycemic-hyperosmolar coma (NKHHC): Secondary | ICD-10-CM

## 2021-10-01 DIAGNOSIS — I10 Essential (primary) hypertension: Secondary | ICD-10-CM

## 2021-10-01 DIAGNOSIS — N1832 Chronic kidney disease, stage 3b: Secondary | ICD-10-CM | POA: Diagnosis not present

## 2021-10-01 DIAGNOSIS — Z125 Encounter for screening for malignant neoplasm of prostate: Secondary | ICD-10-CM

## 2021-10-01 DIAGNOSIS — E538 Deficiency of other specified B group vitamins: Secondary | ICD-10-CM

## 2021-10-01 LAB — BASIC METABOLIC PANEL
BUN: 23 mg/dL (ref 6–23)
CO2: 28 mEq/L (ref 19–32)
Calcium: 10 mg/dL (ref 8.4–10.5)
Chloride: 102 mEq/L (ref 96–112)
Creatinine, Ser: 1.4 mg/dL (ref 0.40–1.50)
GFR: 51.96 mL/min — ABNORMAL LOW (ref >60.00)
Glucose, Bld: 159 mg/dL — ABNORMAL HIGH (ref 70–99)
Potassium: 3.9 mEq/L (ref 3.5–5.1)
Sodium: 138 mEq/L (ref 135–145)

## 2021-10-01 MED ORDER — PIOGLITAZONE HCL 30 MG PO TABS: 30.0000 mg | ORAL_TABLET | Freq: Every day | ORAL | 3 refills | Status: DC

## 2021-10-01 NOTE — Progress Notes (Signed)
Patient ID: [name] [name], male   DOB: 1955/06/03, 66 y.o.   MRN: [id number]        Chief Complaint: follow up HTN, HLD, DM, CKD       HPI:  [name] [name] is a 66 y.o. male here overall doing ok; Pt denies chest pain, increased sob or doe, wheezing, orthopnea, PND, increased [place] swelling, palpitations, dizziness or syncope.   Pt denies polydipsia, polyuria, or new focal neuro s/s.    Pt denies fever, wt loss, night sweats, loss of appetite, or other constitutional symptoms  Denies urinary symptoms such as dysuria, frequency, urgency, flank pain, hematuria or n/v, fever, chills.  Gained wt with less rigourous diet.   Wt Readings from Last 3 Encounters:  10/01/21 184 lb (83.5 kg)  03/26/21 181 lb 9.6 oz (82.4 kg)  03/04/21 175 lb (79.4 kg)   BP Readings from Last 3 Encounters:  10/01/21 122/60  03/26/21 114/60  03/05/21 (!) 164/78         Past Medical History:  Diagnosis Date   CAD (coronary artery disease)    The patient presented in Aug 2008 with acute cornary syndrome. He did have a bypass surgeru. He did have a 3-vessel disease on heart cathiterization and had bypass surgery with LIMA to the LAD, sequential vein graft to the obtuse marginal - 1 and obtuse marginal -2, and vein graft to the RCA. Adenosine myoview (11/10): EF 64%, normal wall motion, normal perfusion.    Cholelithiasis 12/07/2016   GERD (gastroesophageal reflux disease)    History of echocardiogram    November 2008. EF 65-70%. no regional wall abnormalities. Trivial mitral regurgitation.   HTN (hypertension)    Hyperlipidemia    Mild anemia    Hx of post op. 8/08   Paroxysmal atrial fibrillation (HCC)    only episode noted breifly during hospitalization for PCM placement 11/10.    Sick sinus syndrome (HCC)    with presyncope. medtronic dual chamber PCM placed 11/10   Type II or unspecified type diabetes mellitus without mention of complication, not stated as uncontrolled    Past Surgical History:  Procedure Laterality Date    CORONARY ARTERY BYPASS GRAFT     PACEMAKER INSERTION     MDT implanted 2010   rectal abcess     2005. Dr. Carolynne Edouard with interanal sphincterotomy    reports that he quit smoking about 15 years ago. His smoking use included cigarettes. He has a 20.00 pack-year smoking history. He has never used smokeless tobacco. He reports current alcohol use of about 2.0 standard drinks of alcohol per week. He reports that he does not use drugs. Family history is unknown by patient. Allergies  Allergen Reactions   Metformin And Related     rash   Current Outpatient Medications on File Prior to Visit  Medication Sig Dispense Refill   amLODipine (NORVASC) 5 MG tablet Take 1 tablet (5 mg total) by mouth daily. 90 tablet 3   aspirin 81 MG tablet Take 1 tablet (81 mg total) by mouth daily.     atorvastatin (LIPITOR) 40 MG tablet TAKE 1 TABLET BY MOUTH DAILY AT 6 PM 90 tablet 3   Cholecalciferol (THERA-D 2000) 50 MCG (2000 UT) TABS 1 tab by mouth once per day 90 tablet 99   fenofibrate (TRICOR) 145 MG tablet 1 tab by mouth once daily 90 tablet 3   lisinopril (ZESTRIL) 40 MG tablet Take 1 tablet (40 mg total) by mouth daily. 90 tablet 3  loratadine (CLARITIN) 10 MG tablet Take 1 tablet (10 mg total) by mouth daily. 30 tablet 2   metoprolol tartrate (LOPRESSOR) 25 MG tablet Take 1 tablet (25 mg total) by mouth 2 (two) times daily. 180 tablet 3   Omega-3 Fatty Acids (OMEGA-3 FISH OIL) 1200 MG CAPS Take 1 capsule by mouth 2 (two) times daily.     omeprazole (PRILOSEC) 20 MG capsule TAKE ONE CAPSULE BY MOUTH TWICE DAILY BEFORE A MEAL 180 capsule 3   triamcinolone cream (KENALOG) 0.1 % Apply 1 application. topically 2 (two) times daily. As needed for rash or itch 80 g 3   ezetimibe (ZETIA) 10 MG tablet Take 1 tablet (10 mg total) by mouth daily. 90 tablet 3   nitroGLYCERIN (NITROSTAT) 0.4 MG SL tablet Place 1 tablet (0.4 mg total) under the tongue every 5 (five) minutes as needed for chest pain. 15 tablet 3   No  current facility-administered medications on file prior to visit.        ROS:  All others reviewed and negative.  Objective        PE:  BP 122/60 (BP Location: Right Arm, Patient Position: Sitting, Cuff Size: Large)   Pulse 67   Temp 97.9 F (36.6 C) (Oral)   Ht 5\' 4"  (1.626 m)   Wt 184 lb (83.5 kg)   SpO2 94%   BMI 31.58 kg/m                 Constitutional: Pt appears in NAD               HENT: Head: NCAT.                Right Ear: External ear normal.                 Left Ear: External ear normal.                Eyes: . Pupils are equal, round, and reactive to light. Conjunctivae and EOM are normal               Nose: without d/c or deformity               Neck: Neck supple. Gross normal ROM               Cardiovascular: Normal rate and regular rhythm.                 Pulmonary/Chest: Effort normal and breath sounds without rales or wheezing.                Abd:  Soft, NT, ND, + BS, no organomegaly               Neurological: Pt is alert. At baseline orientation, motor grossly intact               Skin: Skin is warm. No rashes, no other new lesions, [place] edema - trace RLE               Psychiatric: Pt behavior is normal without agitation   Micro: none  Cardiac tracings I have personally interpreted today:  none  Pertinent Radiological findings (summarize): none   Lab Results  Component Value Date   WBC 7.5 03/05/2021   HGB 12.8 (L) 03/05/2021   HCT 38.7 (L) 03/05/2021   PLT 228 03/05/2021   GLUCOSE 91 09/24/2021   CHOL 123 09/24/2021   TRIG 103.0 09/24/2021   HDL 54.90 09/24/2021  LDLDIRECT 55.0 12/10/2020   LDLCALC 48 09/24/2021   ALT 15 09/24/2021   AST 18 09/24/2021   NA 137 09/24/2021   K 4.9 09/24/2021   CL 101 09/24/2021   CREATININE 1.88 (H) 09/24/2021   BUN 42 (H) 09/24/2021   CO2 29 09/24/2021   TSH 3.21 06/02/2020   PSA 0.34 06/02/2020   INR 0.94 11/10/2008   HGBA1C 7.3 (H) 09/24/2021   MICROALBUR <0.7 06/02/2020   Assessment/Plan:  [name] [name]  is a 66 y.o. Asian [4] male with  has a past medical history of CAD (coronary artery disease), Cholelithiasis (12/07/2016), GERD (gastroesophageal reflux disease), History of echocardiogram, HTN (hypertension), Hyperlipidemia, Mild anemia, Paroxysmal atrial fibrillation ([place]), Sick sinus syndrome ([place]), and Type II or unspecified type diabetes mellitus without mention of complication, not stated as uncontrolled.  Vitamin D deficiency Last vitamin D Lab Results  Component Value Date   VD25OH 42.20 09/24/2021   Stable, cont oral replacement   Diabetes ([place]) / Lab Results  Component Value Date   HGBA1C 7.3 (H) 09/24/2021   Uncontrolled, with worsening renal fxn - d/c glipizide, increase actos 30 mg qd   Essential hypertension BP Readings from Last 3 Encounters:  10/01/21 122/60  03/26/21 114/60  03/05/21 (!) 164/78   Stable, pt to continue medical treatment lisinopril 40 mg qd as this is doubtful related to worsening renal fxn   Hyperlipidemia Lab Results  Component Value Date   LDLCALC 48 09/24/2021   Stable, pt to continue current statin lipitor 40 mg qd, tricor 145 mg   Acute renal failure superimposed on stage 3b chronic kidney disease ([place]) Lab Results  Component Value Date   CREATININE 1.88 (H) 09/24/2021   Baseline cr 1.2 - 1.4 - new onset, for repeat BMP, also renal u/s, consider renal referral Followup: Return in about 6 months (around 04/01/2022).  Cathlean Cower, MD 10/01/2021 1:10 PM [facility]

## 2021-10-01 NOTE — Assessment & Plan Note (Signed)
Last vitamin D Lab Results  Component Value Date   VD25OH 42.20 09/24/2021   Stable, cont oral replacement

## 2021-10-01 NOTE — Assessment & Plan Note (Signed)
Lab Results  Component Value Date   LDLCALC 48 09/24/2021   Stable, pt to continue current statin lipitor 40 mg qd, tricor 145 mg

## 2021-10-01 NOTE — Assessment & Plan Note (Signed)
/   Lab Results  Component Value Date   HGBA1C 7.3 (H) 09/24/2021   Uncontrolled, with worsening renal fxn - d/c glipizide, increase actos 30 mg qd

## 2021-10-01 NOTE — Assessment & Plan Note (Signed)
BP Readings from Last 3 Encounters:  10/01/21 122/60  03/26/21 114/60  03/05/21 (!) 164/78   Stable, pt to continue medical treatment lisinopril 40 mg qd as this is doubtful related to worsening renal fxn

## 2021-10-01 NOTE — Patient Instructions (Addendum)
Please STOP the meloxicam (mobic)  - the anti-inflammatory that was for pain  Ok to also STOP the glipizide (glucotrol) - this was for sugar  Ok to INCREASE the Actos (pioglitazone) to 30 mg per day  Please continue all other medications as before, including the lisinopril for now  Please have the pharmacy call with any other refills you may need  Please keep your appointments with your specialists as you may have planned  You will be contacted regarding the referral for: Kidney ultrasound  Please go to the LAB at the blood drawing area for the tests to be done - just the repeat kidney test today  If the kidney is still slow, we may need to refer to kidney doctor  You will be contacted by phone if any changes need to be made immediately.  Otherwise, you will receive a letter about your results with an explanation, but please check with MyChart first.  Please remember to sign up for MyChart if you have not done so, as this will be important to you in the future with finding out test results, communicating by private email, and scheduling acute appointments online when needed.  Please make an Appointment to return in 6 months, or sooner if needed, also with Lab Appointment for testing done 3-5 days before at the [facility] (so this is for TWO appointments - please see the scheduling desk as you leave)

## 2021-10-01 NOTE — Assessment & Plan Note (Signed)
Lab Results  Component Value Date   CREATININE 1.88 (H) 09/24/2021   Baseline cr 1.2 - 1.4 - new onset, for repeat BMP, also renal u/s, consider renal referral

## 2021-10-02 ENCOUNTER — Ambulatory Visit
Admission: RE | Admit: 2021-10-02 | Discharge: 2021-10-02 | Disposition: A | Discharge status: Home / Self Care | Payer: Medicare (Managed Care) | Source: Ambulatory Visit | Attending: Internal Medicine | Admitting: Internal Medicine

## 2021-10-02 DIAGNOSIS — N1832 Chronic kidney disease, stage 3b: Secondary | ICD-10-CM

## 2021-11-11 ENCOUNTER — Ambulatory Visit (INDEPENDENT_AMBULATORY_CARE_PROVIDER_SITE_OTHER): Payer: Medicare (Managed Care)

## 2021-11-11 DIAGNOSIS — I495 Sick sinus syndrome: Secondary | ICD-10-CM

## 2021-11-12 LAB — CUP PACEART REMOTE DEVICE CHECK
Battery Impedance: 2122 Ohm
Battery Remaining Longevity: 35 mo
Battery Voltage: 2.75 V
Brady Statistic AP VP Percent: 0 %
Brady Statistic AP VS Percent: 62 %
Brady Statistic AS VP Percent: 0 %
Brady Statistic AS VS Percent: 38 %
Date Time Interrogation Session: 20231102131440
Implantable Lead Connection Status: 753985
Implantable Lead Connection Status: 753985
Implantable Lead Implant Date: 20101101
Implantable Lead Implant Date: 20101101
Implantable Lead Location: 753859
Implantable Lead Location: 753860
Implantable Lead Model: 5076
Implantable Lead Model: 5076
Implantable Pulse Generator Implant Date: 20101101
Lead Channel Impedance Value: 515 Ohm
Lead Channel Impedance Value: 546 Ohm
Lead Channel Pacing Threshold Amplitude: 0.625 V
Lead Channel Pacing Threshold Amplitude: 1 V
Lead Channel Pacing Threshold Pulse Width: 0.4 ms
Lead Channel Pacing Threshold Pulse Width: 0.4 ms
Lead Channel Setting Pacing Amplitude: 2 V
Lead Channel Setting Pacing Amplitude: 2.5 V
Lead Channel Setting Pacing Pulse Width: 0.4 ms
Lead Channel Setting Sensing Sensitivity: 5.6 mV
Zone Setting Status: 755011
Zone Setting Status: 755011

## 2021-11-24 NOTE — Progress Notes (Signed)
Remote pacemaker transmission.   

## 2022-03-31 ENCOUNTER — Ambulatory Visit: Payer: Medicare (Managed Care) | Admitting: Internal Medicine

## 2022-04-01 ENCOUNTER — Ambulatory Visit (INDEPENDENT_AMBULATORY_CARE_PROVIDER_SITE_OTHER): Payer: Medicare (Managed Care) | Admitting: Internal Medicine

## 2022-04-01 ENCOUNTER — Ambulatory Visit (INDEPENDENT_AMBULATORY_CARE_PROVIDER_SITE_OTHER): Payer: Medicare (Managed Care)

## 2022-04-01 VITALS — BP 120/60 | HR 78 | Temp 97.7°F | Ht 64.0 in | Wt 181.0 lb

## 2022-04-01 DIAGNOSIS — Z0001 Encounter for general adult medical examination with abnormal findings: Secondary | ICD-10-CM

## 2022-04-01 DIAGNOSIS — E559 Vitamin D deficiency, unspecified: Secondary | ICD-10-CM

## 2022-04-01 DIAGNOSIS — R0600 Dyspnea, unspecified: Secondary | ICD-10-CM | POA: Diagnosis not present

## 2022-04-01 DIAGNOSIS — Z125 Encounter for screening for malignant neoplasm of prostate: Secondary | ICD-10-CM | POA: Diagnosis not present

## 2022-04-01 DIAGNOSIS — E538 Deficiency of other specified B group vitamins: Secondary | ICD-10-CM

## 2022-04-01 DIAGNOSIS — E119 Type 2 diabetes mellitus without complications: Secondary | ICD-10-CM

## 2022-04-01 DIAGNOSIS — Z0100 Encounter for examination of eyes and vision without abnormal findings: Secondary | ICD-10-CM

## 2022-04-01 DIAGNOSIS — I10 Essential (primary) hypertension: Secondary | ICD-10-CM | POA: Diagnosis not present

## 2022-04-01 DIAGNOSIS — Z1211 Encounter for screening for malignant neoplasm of colon: Secondary | ICD-10-CM

## 2022-04-01 DIAGNOSIS — E0800 Diabetes mellitus due to underlying condition with hyperosmolarity without nonketotic hyperglycemic-hyperosmolar coma (NKHHC): Secondary | ICD-10-CM

## 2022-04-01 DIAGNOSIS — E7800 Pure hypercholesterolemia, unspecified: Secondary | ICD-10-CM | POA: Diagnosis not present

## 2022-04-01 LAB — URINALYSIS, ROUTINE W REFLEX MICROSCOPIC
Bilirubin Urine: NEGATIVE
Hgb urine dipstick: NEGATIVE
Ketones, ur: NEGATIVE
Leukocytes,Ua: NEGATIVE
Nitrite: NEGATIVE
RBC / HPF: NONE SEEN (ref 0–?)
Specific Gravity, Urine: 1.02 (ref 1.000–1.030)
Total Protein, Urine: NEGATIVE
Urine Glucose: >=1000 — AB
Urobilinogen, UA: 0.2 (ref 0.0–1.0)
pH: 6 (ref 5.0–8.0)

## 2022-04-01 LAB — BASIC METABOLIC PANEL
BUN: 23 mg/dL (ref 6–23)
CO2: 31 mEq/L (ref 19–32)
Calcium: 10.4 mg/dL (ref 8.4–10.5)
Chloride: 96 mEq/L (ref 96–112)
Creatinine, Ser: 1.19 mg/dL (ref 0.40–1.50)
GFR: 62.93 mL/min (ref >60.00)
Glucose, Bld: 209 mg/dL — ABNORMAL HIGH (ref 70–99)
Potassium: 4.7 mEq/L (ref 3.5–5.1)
Sodium: 135 mEq/L (ref 135–145)

## 2022-04-01 LAB — VITAMIN D 25 HYDROXY (VIT D DEFICIENCY, FRACTURES): VITD: 48.2 ng/mL (ref 30.00–100.00)

## 2022-04-01 LAB — MICROALBUMIN / CREATININE URINE RATIO
Creatinine,U: 72.4 mg/dL
Microalb Creat Ratio: 1 mg/g (ref 0.0–30.0)
Microalb, Ur: <0.7 mg/dL (ref 0.0–1.9)

## 2022-04-01 LAB — CBC WITH DIFFERENTIAL/PLATELET
Basophils Absolute: 0.1 10*3/uL (ref 0.0–0.1)
Basophils Relative: 1.1 % (ref 0.0–3.0)
Eosinophils Absolute: 0.3 10*3/uL (ref 0.0–0.7)
Eosinophils Relative: 5.7 % — ABNORMAL HIGH (ref 0.0–5.0)
HCT: 41.8 % (ref 39.0–52.0)
Hemoglobin: 13.8 g/dL (ref 13.0–17.0)
Lymphocytes Relative: 25.4 % (ref 12.0–46.0)
Lymphs Abs: 1.5 10*3/uL (ref 0.7–4.0)
MCHC: 33 g/dL (ref 30.0–36.0)
MCV: 90.2 fl (ref 78.0–100.0)
Monocytes Absolute: 0.6 10*3/uL (ref 0.1–1.0)
Monocytes Relative: 9.4 % (ref 3.0–12.0)
Neutro Abs: 3.5 10*3/uL (ref 1.4–7.7)
Neutrophils Relative %: 58.4 % (ref 43.0–77.0)
Platelets: 299 10*3/uL (ref 150.0–400.0)
RBC: 4.64 Mil/uL (ref 4.22–5.81)
RDW: 13.1 % (ref 11.5–15.5)
WBC: 6 10*3/uL (ref 4.0–10.5)

## 2022-04-01 LAB — HEPATIC FUNCTION PANEL
ALT: 14 U/L (ref 0–53)
AST: 16 U/L (ref 0–37)
Albumin: 4.8 g/dL (ref 3.5–5.2)
Alkaline Phosphatase: 43 U/L (ref 39–117)
Bilirubin, Direct: 0.1 mg/dL (ref 0.0–0.3)
Total Bilirubin: 0.7 mg/dL (ref 0.2–1.2)
Total Protein: 7.9 g/dL (ref 6.0–8.3)

## 2022-04-01 LAB — TSH: TSH: 1.11 u[IU]/mL (ref 0.35–5.50)

## 2022-04-01 LAB — LIPID PANEL
Cholesterol: 158 mg/dL (ref 0–200)
HDL: 63.9 mg/dL (ref >39.00)
LDL Cholesterol: 69 mg/dL (ref 0–99)
NonHDL: 93.72
Total CHOL/HDL Ratio: 2
Triglycerides: 124 mg/dL (ref 0.0–149.0)
VLDL: 24.8 mg/dL (ref 0.0–40.0)

## 2022-04-01 LAB — HEMOGLOBIN A1C: Hgb A1c MFr Bld: 10.1 % — ABNORMAL HIGH (ref 4.6–6.5)

## 2022-04-01 LAB — VITAMIN B12: Vitamin B-12: 341 pg/mL (ref 211–911)

## 2022-04-01 LAB — PSA: PSA: 0.42 ng/mL (ref 0.10–4.00)

## 2022-04-01 MED ORDER — FENOFIBRATE 145 MG PO TABS: ORAL_TABLET | ORAL | 3 refills | Status: DC

## 2022-04-01 MED ORDER — AMLODIPINE BESYLATE 5 MG PO TABS: 5.0000 mg | ORAL_TABLET | Freq: Every day | ORAL | 3 refills | Status: DC

## 2022-04-01 MED ORDER — LISINOPRIL 40 MG PO TABS: 40.0000 mg | ORAL_TABLET | Freq: Every day | ORAL | 3 refills | Status: DC

## 2022-04-01 MED ORDER — ATORVASTATIN CALCIUM 40 MG PO TABS: ORAL_TABLET | ORAL | 3 refills | Status: DC

## 2022-04-01 MED ORDER — OMEPRAZOLE 20 MG PO CPDR: DELAYED_RELEASE_CAPSULE | ORAL | 3 refills | Status: DC

## 2022-04-01 MED ORDER — METOPROLOL TARTRATE 25 MG PO TABS: 25.0000 mg | ORAL_TABLET | Freq: Two times a day (BID) | ORAL | 3 refills | Status: DC

## 2022-04-01 NOTE — Patient Instructions (Addendum)
Please have your Shingrix (shingles) shots done at your local pharmacy.  Please continue all other medications as before, and refills have been done if requested.  Please have the pharmacy call with any other refills you may need.  Please continue your efforts at being more active, low cholesterol diet, and weight control.  You are otherwise up to date with prevention measures today.  Please keep your appointments with your specialists as you may have planned  You will be contacted regarding the referral for: eye exam and colonoscopy  Please go to the XRAY Department in the first floor for the x-ray testing  Please go to the LAB at the blood drawing area for the tests to be done  You will be contacted by phone if any changes need to be made immediately.  Otherwise, you will receive a letter about your results with an explanation, but please check with MyChart first.  Please remember to sign up for MyChart if you have not done so, as this will be important to you in the future with finding out test results, communicating by private email, and scheduling acute appointments online when needed.  Please make an Appointment to return in 6 months, or sooner if needed

## 2022-04-02 ENCOUNTER — Other Ambulatory Visit: Payer: Self-pay | Admitting: Internal Medicine

## 2022-04-02 MED ORDER — DAPAGLIFLOZIN PROPANEDIOL 10 MG PO TABS: 10.0000 mg | ORAL_TABLET | Freq: Every day | ORAL | 3 refills | Status: DC

## 2022-04-04 ENCOUNTER — Encounter: Payer: Self-pay | Admitting: Internal Medicine

## 2022-04-04 NOTE — Assessment & Plan Note (Signed)
Lab Results  Component Value Date   HGBA1C 10.1 (H) 04/01/2022   Severe uncontrolled suddenly, pt to continue current medical treatment farxiga 10 mg qd, add actos 30 mg qd, better DM diet compliance,

## 2022-04-04 NOTE — Assessment & Plan Note (Signed)
BP Readings from Last 3 Encounters:  04/01/22 120/60  10/01/21 122/60  03/26/21 114/60   Stable, pt to continue medical treatment norvasc 5 mg qd

## 2022-04-04 NOTE — Assessment & Plan Note (Addendum)
Age and sex appropriate education and counseling updated with regular exercise and diet Referrals for preventative services - for eye exam referral Immunizations addressed - declines covid booster, for shingrix at pharmacy Smoking counseling  - none needed Evidence for depression or other mood disorder - none significant Most recent labs reviewed. I have personally reviewed and have noted: 1) the patient's medical and social history 2) The patient's current medications and supplements 3) The patient's height, weight, and BMI have been recorded in the chart

## 2022-04-04 NOTE — Progress Notes (Signed)
Patient ID: [name] [name], male   DOB: May 24, 1955, 67 y.o.   MRN: [id number]         Chief Complaint:: wellness exam and episodes dyspnea, dm, htn, hld, low vit d       HPI:  [name] [name] is a 67 y.o. male here for wellness exam; declines covid booster and colonoscopy, for shingrix at pharmacy, needs eye exam referral, o/w up to date                        Also Pt denies chest pain, wheezing, orthopnea, PND, increased [place] swelling, palpitations, dizziness or syncope, bu has had 4 episodes of unexplained brief episodes sob at rest, seemed to start and stop within a few minutes, without fever, chills, ST cough as well.  None for several days.   Pt denies polydipsia, polyuria, or new focal neuro s/s.   Pt denies fever, wt loss, night sweats, loss of appetite, or other constitutional symptoms     Wt Readings from Last 3 Encounters:  04/01/22 181 lb (82.1 kg)  10/01/21 184 lb (83.5 kg)  03/26/21 181 lb 9.6 oz (82.4 kg)   BP Readings from Last 3 Encounters:  04/01/22 120/60  10/01/21 122/60  03/26/21 114/60   Immunization History  Administered Date(s) Administered   Influenza Inj Mdck Quad Pf 09/30/2017   Influenza Split 10/27/2020   Influenza Whole 11/11/2008   Influenza, High Dose Seasonal PF 10/08/2020   Influenza,inj,Quad PF,6+ Mos 10/22/2016, 10/17/2018   Influenza-Unspecified 09/11/2013, 10/11/2016, 10/15/2019, 10/20/2021   PFIZER(Purple Top)SARS-COV-2 Vaccination 03/09/2019, 04/04/2019, 11/16/2019   PNEUMOCOCCAL CONJUGATE-20 12/10/2020   Pneumococcal Polysaccharide-23 08/12/2006, 05/01/2014   Td 01/11/2001   Tdap 06/08/2017  There are no preventive care reminders to display for this patient.    Past Medical History:  Diagnosis Date   CAD (coronary artery disease)    The patient presented in Aug 2008 with acute cornary syndrome. He did have a bypass surgeru. He did have a 3-vessel disease on heart cathiterization and had bypass surgery with LIMA to the LAD, sequential vein graft to the  obtuse marginal - 1 and obtuse marginal -2, and vein graft to the RCA. Adenosine myoview (11/10): EF 64%, normal wall motion, normal perfusion.    Cholelithiasis 12/07/2016   GERD (gastroesophageal reflux disease)    History of echocardiogram    November 2008. EF 65-70%. no regional wall abnormalities. Trivial mitral regurgitation.   HTN (hypertension)    Hyperlipidemia    Mild anemia    Hx of post op. 8/08   Paroxysmal atrial fibrillation (HCC)    only episode noted breifly during hospitalization for PCM placement 11/10.    Sick sinus syndrome (HCC)    with presyncope. medtronic dual chamber PCM placed 11/10   Type II or unspecified type diabetes mellitus without mention of complication, not stated as uncontrolled    Past Surgical History:  Procedure Laterality Date   CORONARY ARTERY BYPASS GRAFT     PACEMAKER INSERTION     MDT implanted 2010   rectal abcess     2005. Dr. Marlou Starks with interanal sphincterotomy    reports that he quit smoking about 16 years ago. His smoking use included cigarettes. He has a 20.00 pack-year smoking history. He has never used smokeless tobacco. He reports current alcohol use of about 2.0 standard drinks of alcohol per week. He reports that he does not use drugs. Family history is unknown by patient. Allergies  Allergen Reactions  Metformin And Related     rash   Current Outpatient Medications on File Prior to Visit  Medication Sig Dispense Refill   aspirin 81 MG tablet Take 1 tablet (81 mg total) by mouth daily.     Cholecalciferol (THERA-D 2000) 50 MCG (2000 UT) TABS 1 tab by mouth once per day 90 tablet 99   loratadine (CLARITIN) 10 MG tablet Take 1 tablet (10 mg total) by mouth daily. 30 tablet 2   Omega-3 Fatty Acids (OMEGA-3 FISH OIL) 1200 MG CAPS Take 1 capsule by mouth 2 (two) times daily.     pioglitazone (ACTOS) 30 MG tablet Take 1 tablet (30 mg total) by mouth daily. 90 tablet 3   ezetimibe (ZETIA) 10 MG tablet Take 1 tablet (10 mg total) by  mouth daily. 90 tablet 3   nitroGLYCERIN (NITROSTAT) 0.4 MG SL tablet Place 1 tablet (0.4 mg total) under the tongue every 5 (five) minutes as needed for chest pain. 15 tablet 3   No current facility-administered medications on file prior to visit.        ROS:  All others reviewed and negative.  Objective        PE:  BP 120/60   Pulse 78   Temp 97.7 F (36.5 C) (Oral)   Ht 5\' 4"  (1.626 m)   Wt 181 lb (82.1 kg)   SpO2 93%   BMI 31.07 kg/m                 Constitutional: Pt appears in NAD               HENT: Head: NCAT.                Right Ear: External ear normal.                 Left Ear: External ear normal.                Eyes: . Pupils are equal, round, and reactive to light. Conjunctivae and EOM are normal               Nose: without d/c or deformity               Neck: Neck supple. Gross normal ROM               Cardiovascular: Normal rate and regular rhythm.                 Pulmonary/Chest: Effort normal and breath sounds without rales or wheezing.                Abd:  Soft, NT, ND, + BS, no organomegaly               Neurological: Pt is alert. At baseline orientation, motor grossly intact               Skin: Skin is warm. No rashes, no other new lesions, [place] edema - none               Psychiatric: Pt behavior is normal without agitation   Micro: none  Cardiac tracings I have personally interpreted today:  none  Pertinent Radiological findings (summarize): none   Lab Results  Component Value Date   WBC 6.0 04/01/2022   HGB 13.8 04/01/2022   HCT 41.8 04/01/2022   PLT 299.0 04/01/2022   GLUCOSE 209 (H) 04/01/2022   CHOL 158 04/01/2022   TRIG 124.0 04/01/2022   HDL  63.90 04/01/2022   LDLDIRECT 55.0 12/10/2020   LDLCALC 69 04/01/2022   ALT 14 04/01/2022   AST 16 04/01/2022   NA 135 04/01/2022   K 4.7 04/01/2022   CL 96 04/01/2022   CREATININE 1.19 04/01/2022   BUN 23 04/01/2022   CO2 31 04/01/2022   TSH 1.11 04/01/2022   PSA 0.42 04/01/2022   INR 0.94  11/10/2008   HGBA1C 10.1 (H) 04/01/2022   MICROALBUR <0.7 04/01/2022   Assessment/Plan:  [name] [name] is a 67 y.o. Asian [4] male with  has a past medical history of CAD (coronary artery disease), Cholelithiasis (12/07/2016), GERD (gastroesophageal reflux disease), History of echocardiogram, HTN (hypertension), Hyperlipidemia, Mild anemia, Paroxysmal atrial fibrillation ([place]), Sick sinus syndrome ([place]), and Type II or unspecified type diabetes mellitus without mention of complication, not stated as uncontrolled.  Encounter for well adult exam with abnormal findings Age and sex appropriate education and counseling updated with regular exercise and diet Referrals for preventative services - for eye exam referral Immunizations addressed - declines covid booster, for shingrix at pharmacy Smoking counseling  - none needed Evidence for depression or other mood disorder - none significant Most recent labs reviewed. I have personally reviewed and have noted: 1) the patient's medical and social history 2) The patient's current medications and supplements 3) The patient's height, weight, and BMI have been recorded in the chart   Diabetes [facility]) Lab Results  Component Value Date   HGBA1C 10.1 (H) 04/01/2022   Severe uncontrolled suddenly, pt to continue current medical treatment farxiga 10 mg qd, add actos 30 mg qd, better DM diet compliance,   Dyspnea Exam benign, for cxr, and labs including cbc  Essential hypertension BP Readings from Last 3 Encounters:  04/01/22 120/60  10/01/21 122/60  03/26/21 114/60   Stable, pt to continue medical treatment norvasc 5 mg qd   Hyperlipidemia Lab Results  Component Value Date   LDLCALC 69 04/01/2022   Stable, pt to continue current statin lipitor 40 qd and tricor 145 mg qd   Vitamin D deficiency Last vitamin D Lab Results  Component Value Date   VD25OH 48.20 04/01/2022   Stable, cont oral replacement  Followup: Return in about 6 months  (around 10/02/2022).  Cathlean Cower, MD 04/04/2022 6:24 AM [facility]

## 2022-04-04 NOTE — Assessment & Plan Note (Signed)
Lab Results  Component Value Date   LDLCALC 69 04/01/2022   Stable, pt to continue current statin lipitor 40 qd and tricor 145 mg qd

## 2022-04-04 NOTE — Assessment & Plan Note (Signed)
Last vitamin D Lab Results  Component Value Date   VD25OH 48.20 04/01/2022   Stable, cont oral replacement

## 2022-04-04 NOTE — Assessment & Plan Note (Signed)
Exam benign, for cxr, and labs including cbc

## 2022-05-12 ENCOUNTER — Ambulatory Visit (INDEPENDENT_AMBULATORY_CARE_PROVIDER_SITE_OTHER): Payer: Medicare (Managed Care)

## 2022-05-12 DIAGNOSIS — I495 Sick sinus syndrome: Secondary | ICD-10-CM

## 2022-05-14 LAB — CUP PACEART REMOTE DEVICE CHECK
Battery Impedance: 2682 Ohm
Battery Remaining Longevity: 27 mo
Battery Voltage: 2.74 V
Brady Statistic AP VP Percent: 0 %
Brady Statistic AP VS Percent: 61 %
Brady Statistic AS VP Percent: 0 %
Brady Statistic AS VS Percent: 38 %
Date Time Interrogation Session: 20240503114544
Implantable Lead Connection Status: 753985
Implantable Lead Connection Status: 753985
Implantable Lead Implant Date: 20101101
Implantable Lead Implant Date: 20101101
Implantable Lead Location: 753859
Implantable Lead Location: 753860
Implantable Lead Model: 5076
Implantable Lead Model: 5076
Implantable Pulse Generator Implant Date: 20101101
Lead Channel Impedance Value: 522 Ohm
Lead Channel Impedance Value: 556 Ohm
Lead Channel Pacing Threshold Amplitude: 0.625 V
Lead Channel Pacing Threshold Amplitude: 0.875 V
Lead Channel Pacing Threshold Pulse Width: 0.4 ms
Lead Channel Pacing Threshold Pulse Width: 0.4 ms
Lead Channel Setting Pacing Amplitude: 2 V
Lead Channel Setting Pacing Amplitude: 2.5 V
Lead Channel Setting Pacing Pulse Width: 0.4 ms
Lead Channel Setting Sensing Sensitivity: 5.6 mV
Zone Setting Status: 755011
Zone Setting Status: 755011

## 2022-06-02 NOTE — Progress Notes (Signed)
Remote pacemaker transmission.   

## 2022-06-04 ENCOUNTER — Other Ambulatory Visit: Payer: Self-pay | Admitting: Internal Medicine

## 2022-06-17 ENCOUNTER — Other Ambulatory Visit: Payer: Self-pay

## 2022-06-17 MED ORDER — EZETIMIBE 10 MG PO TABS: 10.0000 mg | ORAL_TABLET | Freq: Every day | ORAL | 3 refills | Status: DC

## 2022-06-18 ENCOUNTER — Telehealth: Payer: Self-pay | Admitting: Internal Medicine

## 2022-06-18 NOTE — Telephone Encounter (Signed)
Not due for a refill

## 2022-06-18 NOTE — Telephone Encounter (Signed)
Patient called states he needs a refill on his atorvastatin (LIPITOR) 40 MG tablet.

## 2022-08-11 ENCOUNTER — Ambulatory Visit (INDEPENDENT_AMBULATORY_CARE_PROVIDER_SITE_OTHER): Payer: Medicare (Managed Care)

## 2022-08-11 DIAGNOSIS — I495 Sick sinus syndrome: Secondary | ICD-10-CM

## 2022-08-12 LAB — CUP PACEART REMOTE DEVICE CHECK
Battery Impedance: 2753 Ohm
Battery Remaining Longevity: 26 mo
Battery Voltage: 2.74 V
Brady Statistic AP VP Percent: 0 %
Brady Statistic AP VS Percent: 62 %
Brady Statistic AS VP Percent: 0 %
Brady Statistic AS VS Percent: 38 %
Date Time Interrogation Session: 20240801142824
Implantable Lead Connection Status: 753985
Implantable Lead Connection Status: 753985
Implantable Lead Implant Date: 20101101
Implantable Lead Implant Date: 20101101
Implantable Lead Location: 753859
Implantable Lead Location: 753860
Implantable Lead Model: 5076
Implantable Lead Model: 5076
Implantable Pulse Generator Implant Date: 20101101
Lead Channel Impedance Value: 532 Ohm
Lead Channel Impedance Value: 559 Ohm
Lead Channel Pacing Threshold Amplitude: 0.5 V
Lead Channel Pacing Threshold Amplitude: 0.75 V
Lead Channel Pacing Threshold Pulse Width: 0.4 ms
Lead Channel Pacing Threshold Pulse Width: 0.4 ms
Lead Channel Setting Pacing Amplitude: 2 V
Lead Channel Setting Pacing Amplitude: 2.5 V
Lead Channel Setting Pacing Pulse Width: 0.4 ms
Lead Channel Setting Sensing Sensitivity: 5.6 mV
Zone Setting Status: 755011
Zone Setting Status: 755011

## 2022-08-26 ENCOUNTER — Encounter (INDEPENDENT_AMBULATORY_CARE_PROVIDER_SITE_OTHER): Payer: Self-pay

## 2022-08-27 ENCOUNTER — Ambulatory Visit: Payer: Medicare (Managed Care) | Admitting: Physician Assistant

## 2022-08-27 VITALS — BP 132/72 | HR 78 | Ht 64.0 in | Wt 172.0 lb

## 2022-08-27 DIAGNOSIS — I480 Paroxysmal atrial fibrillation: Secondary | ICD-10-CM | POA: Diagnosis not present

## 2022-08-27 DIAGNOSIS — Z950 Presence of cardiac pacemaker: Secondary | ICD-10-CM | POA: Diagnosis not present

## 2022-08-27 DIAGNOSIS — I2510 Atherosclerotic heart disease of native coronary artery without angina pectoris: Secondary | ICD-10-CM | POA: Diagnosis not present

## 2022-08-27 DIAGNOSIS — I10 Essential (primary) hypertension: Secondary | ICD-10-CM | POA: Diagnosis not present

## 2022-08-27 NOTE — Progress Notes (Signed)
Cardiology Office Note Date:  08/27/2022  Patient ID:  [name] [name], [name] [name] [birth year], MRN [id number] PCP:  Corwin Levins, MD  Cardiologist:  Dr. Servando Salina Vascular: Dr. Allyson Sabal Electrophysiologist: Dr. Johney Frame >>     Chief Complaint:   annual visit  History of Present Illness: [name] [name] is a 67 y.o. male with history of CAD (CABG 2008), HTN, Afib, SSSx s/p PPM, HLD, PVD (following with Dr. Allyson Sabal, medical management at this time), HTN, HLD, DM, AFib.  He saw Dr. Johney Frame Nov 2021, c/o calf pain b/l with ambulation and referred to Dr Allyson Sabal. Afib burden was <0.1% and OAC therapy deferred to monitor burden.  His metoprolol reduced.  He saw Dr. Allyson Sabal Feb 2022: "He walks on a treadmill for an hour at a time and is noticed recently some calf claudication at 20 to 30 minutes.  Recent Doppler studies performed 12/12/2019 revealed normal ABIs on the right with a moderately elevated signal in the right common iliac artery and a left ABI of 0.88 with no evidence of a critical lesion.  At this point, I recommended conservative therapy and annual Doppler surveillance"   I saw him 11/13/20 He is doing very well. Has retired though stays quite active and is working part Herbalist as well. He walks on the treadmill 3-4x week  He denies any kind of CP, palpitations or cardiac awareness No SOB, DOE No dizziness, near syncope or syncope Reports that his PMD does labs every 74mo 0.2% AF burden, some felt to be false, no OAC No changes were made  He saw Dr. Servando Salina 11/26/20, doing well, working full time, no changes made, planned for an annual visit  TODAY He feels well No CP, palpitations or cardiac awareness Would like to take less meds No SOB No near syncope or syncope. He has retired, but remains active    Device information MDT dual chamber PPM implanted 11/11/2008   Past Medical History:  Diagnosis Date   CAD (coronary artery disease)    The patient presented in Aug 2008 with acute cornary  syndrome. He did have a bypass surgeru. He did have a 3-vessel disease on heart cathiterization and had bypass surgery with LIMA to the LAD, sequential vein graft to the obtuse marginal - 1 and obtuse marginal -2, and vein graft to the RCA. Adenosine myoview (11/10): EF 64%, normal wall motion, normal perfusion.    Cholelithiasis 12/07/2016   GERD (gastroesophageal reflux disease)    History of echocardiogram    November 2008. EF 65-70%. no regional wall abnormalities. Trivial mitral regurgitation.   HTN (hypertension)    Hyperlipidemia    Mild anemia    Hx of post op. 8/08   Paroxysmal atrial fibrillation (HCC)    only episode noted breifly during hospitalization for PCM placement 11/10.    Sick sinus syndrome (HCC)    with presyncope. medtronic dual chamber PCM placed 11/10   Type II or unspecified type diabetes mellitus without mention of complication, not stated as uncontrolled     Past Surgical History:  Procedure Laterality Date   CORONARY ARTERY BYPASS GRAFT     PACEMAKER INSERTION     MDT implanted 2010   rectal abcess     2005. Dr. Carolynne Edouard with interanal sphincterotomy    Current Outpatient Medications  Medication Sig Dispense Refill   dapagliflozin propanediol (FARXIGA) 10 MG TABS tablet Take 1 tablet (10 mg total) by mouth daily before breakfast. 90 tablet 3   amLODipine (NORVASC) 5  MG tablet Take 1 tablet (5 mg total) by mouth daily. 90 tablet 3   aspirin 81 MG tablet Take 1 tablet (81 mg total) by mouth daily.     atorvastatin (LIPITOR) 40 MG tablet TAKE 1 TABLET BY MOUTH DAILY AT 6 PM 90 tablet 3   Cholecalciferol (THERA-D 2000) 50 MCG (2000 UT) TABS 1 tab by mouth once per day 90 tablet 99   ezetimibe (ZETIA) 10 MG tablet Take 1 tablet (10 mg total) by mouth daily. 90 tablet 3   fenofibrate (TRICOR) 145 MG tablet 1 tab by mouth once daily 90 tablet 3   lisinopril (ZESTRIL) 40 MG tablet Take 1 tablet (40 mg total) by mouth daily. 90 tablet 3   loratadine (CLARITIN) 10 MG  tablet Take 1 tablet (10 mg total) by mouth daily. 30 tablet 2   metoprolol tartrate (LOPRESSOR) 25 MG tablet Take 1 tablet (25 mg total) by mouth 2 (two) times daily. 180 tablet 3   nitroGLYCERIN (NITROSTAT) 0.4 MG SL tablet Place 1 tablet (0.4 mg total) under the tongue every 5 (five) minutes as needed for chest pain. 15 tablet 3   Omega-3 Fatty Acids (OMEGA-3 FISH OIL) 1200 MG CAPS Take 1 capsule by mouth 2 (two) times daily.     omeprazole (PRILOSEC) 20 MG capsule TAKE 1 CAPSULE BY MOUTH TWICE DAILY BEFORE A MEAL 180 capsule 3   pioglitazone (ACTOS) 30 MG tablet Take 1 tablet (30 mg total) by mouth daily. 90 tablet 3   No current facility-administered medications for this visit.    Allergies:   Metformin and related   Social History:  The patient  reports that he quit smoking about 16 years ago. His smoking use included cigarettes. He started smoking about 36 years ago. He has a 20 pack-year smoking history. He has never used smokeless tobacco. He reports current alcohol use of about 2.0 standard drinks of alcohol per week. He reports that he does not use drugs.   Family History:  The patient's Family history is unknown by patient.  ROS:  Please see the history of present illness.    All other systems are reviewed and otherwise negative.   PHYSICAL EXAM:  VS:  There were no vitals taken for this visit. BMI: There is no height or weight on file to calculate BMI. Well nourished, well developed, in no acute distress HEENT: normocephalic, atraumatic Neck: no JVD, carotid bruits or masses Cardiac: RRR; no significant murmurs, no rubs, or gallops Lungs: CTA b/l, no wheezing, rhonchi or rales Abd: soft, nontender MS: no deformity or atrophy Ext: no edema Skin: warm and dry, no rash Neuro:  No gross deficits appreciated Psych: euthymic mood, full affect  PPM site is stable, no tethering or discomfort   EKG:  Done today and reviewed by myself shows  AP/VS 78bpm  Device interrogation  done today and reviewed by myself:  Battery and lead measurements are good  Since 11/13/20: 6199 AMS episodes Longest 2 seconds in jan 202 (no EGM) All available EGMs are reviewed Difficult to see clearly, perhaps PACs, vs PATs, vs PAFib Burden is 0.1%    11/11/2008: TTE  Study Conclusions   1. Left ventricle: The cavity size was normal. Wall thickness was      normal. Systolic function was normal. The estimated ejection      fraction was in the range of 60% to 65%.   2. Mitral valve: Calcified annulus. Mildly thickened leaflets   Recent Labs: 04/01/2022: ALT  14; BUN 23; Creatinine, Ser 1.19; Hemoglobin 13.8; Platelets 299.0; Potassium 4.7; Sodium 135; TSH 1.11  04/01/2022: Cholesterol 158; HDL 63.90; LDL Cholesterol 69; Total CHOL/HDL Ratio 2; Triglycerides 124.0; VLDL 24.8   CrCl cannot be calculated (Patient's most recent lab result is older than the maximum 21 days allowed.).   Wt Readings from Last 3 Encounters:  04/01/22 181 lb (82.1 kg)  10/01/21 184 lb (83.5 kg)  03/26/21 181 lb 9.6 oz (82.4 kg)     Other studies reviewed: Additional studies/records reviewed today include: summarized above  ASSESSMENT AND PLAN:  PPM Intact function No programming changes made  CAD No anginal symptoms On ASA, statin/zetia, BB c/w Dr. Servando Salina  Paroxysmal Afib SCAF Not convinced all is true AFib 0.1 % burden Follow  HTN Looks good  HLD Monitored and managed with his PMD    Disposition: he is overdue to see Dr. Servando Salina, plan for a visit with her, discussed need for new EP MD, will have him see Dr. Lalla Brothers, he is agreeable, 58mo sooner if needed    Current medicines are reviewed at length with the patient today.  The patient did not have any concerns regarding medicines.  Norma Fredrickson, PA-C 08/27/2022 7:02 AM     [facility] [address] [place] [address] [phone] (office)  [phone] (fax)

## 2022-08-27 NOTE — Patient Instructions (Signed)
Medication Instructions:   Your physician recommends that you continue on your current medications as directed. Please refer to the Current Medication list given to you today.   *If you need a refill on your cardiac medications before your next appointment, please call your pharmacy*   Lab Work: NONE ORDERED  TODAY   If you have labs (blood work) drawn today and your tests are completely normal, you will receive your results only by: MyChart Message (if you have MyChart) OR A paper copy in the mail If you have any lab test that is abnormal or we need to change your treatment, we will call you to review the results.   Testing/Procedures: NONE ORDERED  TODAY     Follow-Up: At [facility], you and your health needs are our priority.  As part of our continuing mission to provide you with exceptional heart care, we have created designated Provider Care Teams.  These Care Teams include your primary Cardiologist (physician) and Advanced Practice Providers (APPs -  Physician Assistants and Nurse Practitioners) who all work together to provide you with the care you need, when you need it.  We recommend signing up for the patient portal called "MyChart".  Sign up information is provided on this After Visit Summary.  MyChart is used to connect with patients for Virtual Visits (Telemedicine).  Patients are able to view lab/test results, encounter notes, upcoming appointments, etc.  Non-urgent messages can be sent to your provider as well.   To learn more about what you can do with MyChart, go to [email or web address].    Your next appointment:   OVER DUE  DR TOBB  NEST AVAILABLE    6 month(s)  Provider:   Steffanie Dunn, MD new patient    Other Instructions

## 2022-08-28 LAB — CUP PACEART INCLINIC DEVICE CHECK
Battery Impedance: 2951 Ohm
Battery Remaining Longevity: 24 mo
Battery Voltage: 2.74 V
Brady Statistic AP VP Percent: 0 %
Brady Statistic AP VS Percent: 62 %
Brady Statistic AS VP Percent: 0 %
Brady Statistic AS VS Percent: 38 %
Date Time Interrogation Session: 20240816134900
Implantable Lead Connection Status: 753985
Implantable Lead Connection Status: 753985
Implantable Lead Implant Date: 20101101
Implantable Lead Implant Date: 20101101
Implantable Lead Location: 753859
Implantable Lead Location: 753860
Implantable Lead Model: 5076
Implantable Lead Model: 5076
Implantable Pulse Generator Implant Date: 20101101
Lead Channel Impedance Value: 514 Ohm
Lead Channel Impedance Value: 521 Ohm
Lead Channel Pacing Threshold Amplitude: 0.5 V
Lead Channel Pacing Threshold Amplitude: 0.5 V
Lead Channel Pacing Threshold Amplitude: 0.75 V
Lead Channel Pacing Threshold Amplitude: 1 V
Lead Channel Pacing Threshold Pulse Width: 0.4 ms
Lead Channel Pacing Threshold Pulse Width: 0.4 ms
Lead Channel Pacing Threshold Pulse Width: 0.4 ms
Lead Channel Pacing Threshold Pulse Width: 0.4 ms
Lead Channel Sensing Intrinsic Amplitude: 15.67 mV
Lead Channel Setting Pacing Amplitude: 2 V
Lead Channel Setting Pacing Amplitude: 2.5 V
Lead Channel Setting Pacing Pulse Width: 0.4 ms
Lead Channel Setting Sensing Sensitivity: 5.6 mV
Zone Setting Status: 755011
Zone Setting Status: 755011

## 2022-09-01 NOTE — Progress Notes (Signed)
Remote pacemaker transmission.   

## 2022-09-09 ENCOUNTER — Ambulatory Visit (INDEPENDENT_AMBULATORY_CARE_PROVIDER_SITE_OTHER): Payer: Medicare (Managed Care)

## 2022-09-09 VITALS — Ht 64.0 in | Wt 172.0 lb

## 2022-09-09 DIAGNOSIS — Z0000 Encounter for general adult medical examination without abnormal findings: Secondary | ICD-10-CM

## 2022-09-09 NOTE — Progress Notes (Signed)
Subjective:   [name] [name] is a 67 y.o. male who presents for an Initial Medicare Annual Wellness Visit.  Visit Complete: Virtual  I connected with  AXCEL BHAGAT on 09/09/22 by a audio enabled telemedicine application and verified that I am speaking with the correct person using two identifiers.  Patient Location: Home  Provider Location: Office/Clinic  I discussed the limitations of evaluation and management by telemedicine. The patient expressed understanding and agreed to proceed.  Vital Signs: Because this visit was a virtual/telehealth visit, some criteria may be missing or patient reported. Any vitals not documented were not able to be obtained and vitals that have been documented are patient reported.    Review of Systems    Cardiac Risk Factors include: advanced age (>71men, >71 women);male gender     Objective:    Today's Vitals   09/09/22 1114  Weight: 172 lb (78 kg)  Height: 5\' 4"  (1.626 m)   Body mass index is 29.52 kg/m.     09/09/2022   11:18 AM  Advanced Directives  Does Patient Have a Medical Advance Directive? Yes  Type of Estate agent of [place];Living will  Copy of Healthcare Power of Attorney in Chart? No - copy requested    Current Medications (verified) Outpatient Encounter Medications as of 09/09/2022  Medication Sig   amLODipine (NORVASC) 5 MG tablet Take 1 tablet (5 mg total) by mouth daily.   aspirin 81 MG tablet Take 1 tablet (81 mg total) by mouth daily.   atorvastatin (LIPITOR) 40 MG tablet TAKE 1 TABLET BY MOUTH DAILY AT 6 PM   Cholecalciferol (THERA-D 2000) 50 MCG (2000 UT) TABS 1 tab by mouth once per day   dapagliflozin propanediol (FARXIGA) 10 MG TABS tablet Take 1 tablet (10 mg total) by mouth daily before breakfast.   ezetimibe (ZETIA) 10 MG tablet Take 1 tablet (10 mg total) by mouth daily.   fenofibrate (TRICOR) 145 MG tablet 1 tab by mouth once daily   lisinopril (ZESTRIL) 40 MG tablet Take 1 tablet (40 mg  total) by mouth daily.   loratadine (CLARITIN) 10 MG tablet Take 1 tablet (10 mg total) by mouth daily.   metoprolol tartrate (LOPRESSOR) 25 MG tablet Take 1 tablet (25 mg total) by mouth 2 (two) times daily.   Omega-3 Fatty Acids (OMEGA-3 FISH OIL) 1200 MG CAPS Take 1 capsule by mouth 2 (two) times daily.   omeprazole (PRILOSEC) 20 MG capsule TAKE 1 CAPSULE BY MOUTH TWICE DAILY BEFORE A MEAL   pioglitazone (ACTOS) 30 MG tablet Take 1 tablet (30 mg total) by mouth daily.   nitroGLYCERIN (NITROSTAT) 0.4 MG SL tablet Place 1 tablet (0.4 mg total) under the tongue every 5 (five) minutes as needed for chest pain.   No facility-administered encounter medications on file as of 09/09/2022.    Allergies (verified) Metformin and related   History: Past Medical History:  Diagnosis Date   CAD (coronary artery disease)    The patient presented in Aug 2008 with acute cornary syndrome. He did have a bypass surgeru. He did have a 3-vessel disease on heart cathiterization and had bypass surgery with LIMA to the LAD, sequential vein graft to the obtuse marginal - 1 and obtuse marginal -2, and vein graft to the RCA. Adenosine myoview (11/10): EF 64%, normal wall motion, normal perfusion.    Cholelithiasis 12/07/2016   GERD (gastroesophageal reflux disease)    History of echocardiogram    November 2008. EF 65-70%. no  regional wall abnormalities. Trivial mitral regurgitation.   HTN (hypertension)    Hyperlipidemia    Mild anemia    Hx of post op. 8/08   Paroxysmal atrial fibrillation (HCC)    only episode noted breifly during hospitalization for PCM placement 11/10.    Sick sinus syndrome (HCC)    with presyncope. medtronic dual chamber PCM placed 11/10   Type II or unspecified type diabetes mellitus without mention of complication, not stated as uncontrolled    Past Surgical History:  Procedure Laterality Date   CORONARY ARTERY BYPASS GRAFT     PACEMAKER INSERTION     MDT implanted 2010   rectal  abcess     2005. Dr. Carolynne Edouard with interanal sphincterotomy   Family History  Family history unknown: Yes   Social History   Socioeconomic History   Marital status: Married    Spouse name: Not on file   Number of children: 3   Years of education: Not on file   Highest education level: Not on file  Occupational History   Occupation: Retired  Tobacco Use   Smoking status: Former    Current packs/day: 0.00    Average packs/day: 1 pack/day for 20.0 years (20.0 ttl pk-yrs)    Types: Cigarettes    Start date: 01/11/1986    Quit date: 01/11/2006    Years since quitting: 16.6   Smokeless tobacco: Never  Vaping Use   Vaping status: Never Used  Substance and Sexual Activity   Alcohol use: Yes    Alcohol/week: 2.0 standard drinks of alcohol    Types: 2 Cans of beer per week    Comment: social on weekends   Drug use: No   Sexual activity: Not on file  Other Topics Concern   Not on file  Social History Narrative   Married, 3 children. To [place] from [place] 1979. Work- sign spray pain- Copywriter, advertising   Social Determinants of Health   Financial Resource Strain: Low Risk  (09/09/2022)   Overall Financial Resource Strain (CARDIA)    Difficulty of Paying Living Expenses: Not hard at all  Food Insecurity: No Food Insecurity (09/09/2022)   Hunger Vital Sign    Worried About Running Out of Food in the Last Year: Never true    Ran Out of Food in the Last Year: Never true  Transportation Needs: No Transportation Needs (09/09/2022)   PRAPARE - Administrator, Civil Service (Medical): No    Lack of Transportation (Non-Medical): No  Physical Activity: Insufficiently Active (09/09/2022)   Exercise Vital Sign    Days of Exercise per Week: 2 days    Minutes of Exercise per Session: 30 min  Stress: No Stress Concern Present (09/09/2022)   [employer] of Occupational Health - Occupational Stress Questionnaire    Feeling of Stress : Not at all  Social Connections: Moderately Integrated  (09/09/2022)   Social Connection and Isolation Panel [NHANES]    Frequency of Communication with Friends and Family: Three times a week    Frequency of Social Gatherings with Friends and Family: Never    Attends Religious Services: More than 4 times per year    Active Member of [employer] or Organizations: No    Attends Engineer, structural: Never    Marital Status: Married    Tobacco Counseling Counseling given: Not Answered   Clinical Intake:  Pre-visit preparation completed: Yes  Pain : No/denies pain     BMI - recorded: 29.52 Nutritional Status: BMI 25 -  29 Overweight Nutritional Risks: None Diabetes: Yes CBG done?: No Did pt. bring in CBG monitor from home?: No  How often do you need to have someone help you when you read instructions, pamphlets, or other written materials from your doctor or pharmacy?: 1 - Never  Interpreter Needed?: No  Information entered by :: Myleka Moncure, RMA   Activities of Daily Living    09/09/2022   11:16 AM  In your present state of health, do you have any difficulty performing the following activities:  Hearing? 0  Vision? 0  Difficulty concentrating or making decisions? 0  Walking or climbing stairs? 0  Dressing or bathing? 0  Doing errands, shopping? 0  Preparing Food and eating ? N  Using the Toilet? N  In the past six months, have you accidently leaked urine? N  Do you have problems with loss of bowel control? N  Managing your Medications? N  Managing your Finances? N  Housekeeping or managing your Housekeeping? N    Patient Care Team: Corwin Levins, MD as PCP - Kevin Fenton, MD (Inactive) as PCP - Electrophysiology (Cardiology) Thomasene Ripple, DO as PCP - Cardiology (Cardiology) Rachael Fee, MD (Gastroenterology) Hillis Range, MD (Inactive) (Cardiology) Laurey Morale, MD (Cardiology)  Indicate any recent Medical Services you may have received from other than [facility] providers in the past year (date  may be approximate).     Assessment:   This is a routine wellness examination for [place].  Hearing/Vision screen Hearing Screening - Comments:: Denies hearing difficulties   Vision Screening - Comments:: Denies vision issues.   Dietary issues and exercise activities discussed:     Goals Addressed   None   Depression Screen    09/09/2022   11:25 AM 04/01/2022   10:45 AM 10/01/2021   10:55 AM 03/26/2021   10:31 AM 12/10/2020   11:49 AM 12/10/2020   11:19 AM 06/02/2020   10:45 AM  PHQ 2/9 Scores  PHQ - 2 Score 0 0  0 0 0 0  PHQ- 9 Score 0 0       Exception Documentation   Patient refusal        Fall Risk    09/09/2022   11:19 AM 04/01/2022   10:46 AM 10/01/2021   10:55 AM 03/26/2021   10:31 AM 12/10/2020   11:49 AM  Fall Risk   Falls in the past year? 0 0 0 0 0  Number falls in past yr: 0 0  0 0  Injury with Fall? 0 0  0 0  Risk for fall due to : No Fall Risks No Fall Risks No Fall Risks    Follow up Falls prevention discussed;Falls evaluation completed Falls evaluation completed;Education provided Falls evaluation completed      MEDICARE RISK AT HOME: Medicare Risk at Home Any stairs in or around the home?: Yes If so, are there any without handrails?: Yes Home free of loose throw rugs in walkways, pet beds, electrical cords, etc?: Yes Adequate lighting in your home to reduce risk of falls?: Yes Life alert?: No Use of a cane, walker or w/c?: No Grab bars in the bathroom?: No Shower chair or bench in shower?: No Elevated toilet seat or a handicapped toilet?: No  TIMED UP AND GO:  Was the test performed? No    Cognitive Function:        09/09/2022   11:20 AM  6CIT Screen  What Year? 0 points  What month? 0 points  What time? 0 points  Count back from 20 0 points  Months in reverse 4 points  Repeat phrase 0 points  Total Score 4 points    Immunizations Immunization History  Administered Date(s) Administered   Influenza Inj Mdck Quad Pf 09/30/2017    Influenza Split 10/27/2020   Influenza Whole 11/11/2008   Influenza, High Dose Seasonal PF 10/08/2020   Influenza,inj,Quad PF,6+ Mos 10/22/2016, 10/17/2018   Influenza-Unspecified 09/11/2013, 10/11/2016, 10/15/2019, 10/20/2021   PFIZER(Purple Top)SARS-COV-2 Vaccination 03/09/2019, 04/04/2019, 11/16/2019   PNEUMOCOCCAL CONJUGATE-20 12/10/2020   Pneumococcal Polysaccharide-23 08/12/2006, 05/01/2014   Td 01/11/2001   Tdap 06/08/2017    TDAP status: Up to date  Flu Vaccine status: Due, Education has been provided regarding the importance of this vaccine. Advised may receive this vaccine at local pharmacy or Health Dept. Aware to provide a copy of the vaccination record if obtained from local pharmacy or Health Dept. Verbalized acceptance and understanding.  Pneumococcal vaccine status: Up to date  Covid-19 vaccine status: Completed vaccines  Qualifies for Shingles Vaccine? Yes   Zostavax completed Yes   Shingrix Completed?: No.    Education has been provided regarding the importance of this vaccine. Patient has been advised to call insurance company to determine out of pocket expense if they have not yet received this vaccine. Advised may also receive vaccine at local pharmacy or Health Dept. Verbalized acceptance and understanding.  Screening Tests Health Maintenance  Topic Date Due   Zoster Vaccines- Shingrix (1 of 2) Never done   COVID-19 Vaccine (4 - 2023-24 season) 09/11/2021   INFLUENZA VACCINE  08/12/2022   Colonoscopy  10/02/2022 (Originally 04/22/2021)   HEMOGLOBIN A1C  10/02/2022   Diabetic kidney evaluation - eGFR measurement  04/01/2023   Diabetic kidney evaluation - Urine ACR  04/01/2023   FOOT EXAM  04/01/2023   OPHTHALMOLOGY EXAM  04/01/2023   Medicare Annual Wellness (AWV)  09/09/2023   DTaP/Tdap/Td (3 - Td or Tdap) 06/09/2027   Pneumonia Vaccine 64+ Years old  Completed   Hepatitis C Screening  Completed   HPV VACCINES  Aged Out    Health Maintenance  Health  Maintenance Due  Topic Date Due   Zoster Vaccines- Shingrix (1 of 2) Never done   COVID-19 Vaccine (4 - 2023-24 season) 09/11/2021   INFLUENZA VACCINE  08/12/2022    Colorectal cancer screening: Type of screening: Colonoscopy. Completed 04/23/2011. Repeat every 10 years  Lung Cancer Screening: (Low Dose CT Chest recommended if Age 5-80 years, 20 pack-year currently smoking OR have quit w/in 15years.) does qualify.   Lung Cancer Screening Referral: had on on 04/01/2022  Additional Screening:  Hepatitis C Screening: does qualify; Completed 06/01/2016  Vision Screening: Recommended annual ophthalmology exams for early detection of glaucoma and other disorders of the eye. Is the patient up to date with their annual eye exam?  No  Who is the provider or what is the name of the office in which the patient attends annual eye exams? [facility] If pt is not established with a provider, would they like to be referred to a provider to establish care? Yes .   Dental Screening: Recommended annual dental exams for proper oral hygiene  Diabetic Foot Exam: Diabetic Foot Exam: Completed 04/01/2022  Community Resource Referral / Chronic Care Management: CRR required this visit?  No   CCM required this visit?  No    Plan:     I have personally reviewed and noted the following in the patient's chart:   Medical and  social history Use of alcohol, tobacco or illicit drugs  Current medications and supplements including opioid prescriptions. Patient is not currently taking opioid prescriptions. Functional ability and status Nutritional status Physical activity Advanced directives List of other physicians Hospitalizations, surgeries, and ER visits in previous 12 months Vitals Screenings to include cognitive, depression, and falls Referrals and appointments  In addition, I have reviewed and discussed with patient certain preventive protocols, quality metrics, and best practice recommendations. A  written personalized care plan for preventive services as well as general preventive health recommendations were provided to patient.     Jaye Polidori L Milda Lindvall, CMA   09/09/2022   After Visit Summary: (MyChart) Due to this being a telephonic visit, the after visit summary with patients personalized plan was offered to patient via MyChart   Nurse Notes: Patient is due for a colonoscopy exam and an annual eye exam.  Patient has referral for the colonoscopy from 04/01/2022 and he is aware to call to get scheduled for it, as I give the patient the number to [facility] today during this visit.  Patient stated that his last eye exam was last year [facility] at [facility].

## 2022-09-09 NOTE — Patient Instructions (Signed)
[name] , Thank you for taking time to come for your Medicare Wellness Visit. I appreciate your ongoing commitment to your health goals. Please review the following plan we discussed and let me know if I can assist you in the future.   Referrals/Orders/Follow-Ups/Clinician Recommendations: You are due for a colonoscopy.  Please call to schedule an appointment at [facility] at [phone].  You also are due for a annual eye exam, which you need to call and schedule soon.  Each day, aim for 6 glasses of water, plenty of protein in your diet and try to get up and walk/ stretch every hour for 5-10 minutes at a time.    This is a list of the screening recommended for you and due dates:  Health Maintenance  Topic Date Due   Zoster (Shingles) Vaccine (1 of 2) Never done   COVID-19 Vaccine (4 - 2023-24 season) 09/11/2021   Flu Shot  08/12/2022   Colon Cancer Screening  10/02/2022*   Hemoglobin A1C  10/02/2022   Yearly kidney function blood test for diabetes  04/01/2023   Yearly kidney health urinalysis for diabetes  04/01/2023   Complete foot exam   04/01/2023   Eye exam for diabetics  04/01/2023   Medicare Annual Wellness Visit  09/09/2023   DTaP/Tdap/Td vaccine (3 - Td or Tdap) 06/09/2027   Pneumonia Vaccine  Completed   Hepatitis C Screening  Completed   HPV Vaccine  Aged Out  *Topic was postponed. The date shown is not the original due date.    Advanced directives: (Copy Requested) Please bring a copy of your health care power of attorney and living will to the office to be added to your chart at your convenience.  Next Medicare Annual Wellness Visit scheduled for next year: Yes

## 2022-10-01 ENCOUNTER — Encounter: Payer: Self-pay | Admitting: Internal Medicine

## 2022-10-01 ENCOUNTER — Ambulatory Visit (INDEPENDENT_AMBULATORY_CARE_PROVIDER_SITE_OTHER): Payer: Medicare (Managed Care) | Admitting: Internal Medicine

## 2022-10-01 ENCOUNTER — Other Ambulatory Visit: Payer: Self-pay | Admitting: Internal Medicine

## 2022-10-01 VITALS — BP 144/70 | HR 79 | Temp 98.1°F | Ht 64.0 in | Wt 175.2 lb

## 2022-10-01 DIAGNOSIS — E7800 Pure hypercholesterolemia, unspecified: Secondary | ICD-10-CM

## 2022-10-01 DIAGNOSIS — E559 Vitamin D deficiency, unspecified: Secondary | ICD-10-CM | POA: Diagnosis not present

## 2022-10-01 DIAGNOSIS — E538 Deficiency of other specified B group vitamins: Secondary | ICD-10-CM

## 2022-10-01 DIAGNOSIS — I10 Essential (primary) hypertension: Secondary | ICD-10-CM

## 2022-10-01 DIAGNOSIS — E1165 Type 2 diabetes mellitus with hyperglycemia: Secondary | ICD-10-CM

## 2022-10-01 DIAGNOSIS — Z7984 Long term (current) use of oral hypoglycemic drugs: Secondary | ICD-10-CM

## 2022-10-01 DIAGNOSIS — J309 Allergic rhinitis, unspecified: Secondary | ICD-10-CM

## 2022-10-01 DIAGNOSIS — Z1211 Encounter for screening for malignant neoplasm of colon: Secondary | ICD-10-CM

## 2022-10-01 LAB — BASIC METABOLIC PANEL
BUN: 18 mg/dL (ref 6–23)
CO2: 29 mEq/L (ref 19–32)
Calcium: 10.2 mg/dL (ref 8.4–10.5)
Chloride: 99 mEq/L (ref 96–112)
Creatinine, Ser: 1.1 mg/dL (ref 0.40–1.50)
GFR: 68.92 mL/min (ref >60.00)
Glucose, Bld: 189 mg/dL — ABNORMAL HIGH (ref 70–99)
Potassium: 4.4 mEq/L (ref 3.5–5.1)
Sodium: 136 mEq/L (ref 135–145)

## 2022-10-01 LAB — LIPID PANEL
Cholesterol: 142 mg/dL (ref 0–200)
HDL: 65.3 mg/dL (ref >39.00)
LDL Cholesterol: 32 mg/dL (ref 0–99)
NonHDL: 76.31
Total CHOL/HDL Ratio: 2
Triglycerides: 223 mg/dL — ABNORMAL HIGH (ref 0.0–149.0)
VLDL: 44.6 mg/dL — ABNORMAL HIGH (ref 0.0–40.0)

## 2022-10-01 LAB — HEPATIC FUNCTION PANEL
ALT: 13 U/L (ref 0–53)
AST: 13 U/L (ref 0–37)
Albumin: 4.5 g/dL (ref 3.5–5.2)
Alkaline Phosphatase: 37 U/L — ABNORMAL LOW (ref 39–117)
Bilirubin, Direct: 0.1 mg/dL (ref 0.0–0.3)
Total Bilirubin: 0.4 mg/dL (ref 0.2–1.2)
Total Protein: 7.7 g/dL (ref 6.0–8.3)

## 2022-10-01 LAB — VITAMIN D 25 HYDROXY (VIT D DEFICIENCY, FRACTURES): VITD: 59.86 ng/mL (ref 30.00–100.00)

## 2022-10-01 LAB — HEMOGLOBIN A1C: Hgb A1c MFr Bld: 9 % — ABNORMAL HIGH (ref 4.6–6.5)

## 2022-10-01 MED ORDER — PIOGLITAZONE HCL 45 MG PO TABS: 45.0000 mg | ORAL_TABLET | Freq: Every day | ORAL | 3 refills | Status: DC

## 2022-10-01 NOTE — Patient Instructions (Signed)
Please continue all other medications as before, and refills have been done if requested.  Please have the pharmacy call with any other refills you may need.  Please continue your efforts at being more active, low cholesterol diet, and weight control.  Please keep your appointments with your specialists as you may have planned  Please continue to monitor your BP at home on a regular basis  Please go to the LAB at the blood drawing area for the tests to be done  You will be contacted by phone if any changes need to be made immediately.  Otherwise, you will receive a letter about your results with an explanation, but please check with MyChart first.  Please make an Appointment to return in 6 months, or sooner if needed, also with Lab Appointment for testing done 3-5 days before at the FIRST FLOOR Lab (so this is for TWO appointments - please see the scheduling desk as you leave)

## 2022-10-01 NOTE — Progress Notes (Unsigned)
Patient ID: [name] [name], male   DOB: January 04, 1956, 67 y.o.   MRN: [id number]        Chief Complaint: follow up HTN, HLD and hyperglycemia ***       HPI:  [name] [name] is a 67 y.o. male here with c/o        Wt Readings from Last 3 Encounters:  10/01/22 175 lb 3.2 oz (79.5 kg)  09/09/22 172 lb (78 kg)  08/27/22 172 lb (78 kg)   BP Readings from Last 3 Encounters:  10/01/22 (!) 144/70  08/27/22 132/72  04/01/22 120/60         Past Medical History:  Diagnosis Date   CAD (coronary artery disease)    The patient presented in Aug 2008 with acute cornary syndrome. He did have a bypass surgeru. He did have a 3-vessel disease on heart cathiterization and had bypass surgery with LIMA to the LAD, sequential vein graft to the obtuse marginal - 1 and obtuse marginal -2, and vein graft to the RCA. Adenosine myoview (11/10): EF 64%, normal wall motion, normal perfusion.    Cholelithiasis 12/07/2016   GERD (gastroesophageal reflux disease)    History of echocardiogram    November 2008. EF 65-70%. no regional wall abnormalities. Trivial mitral regurgitation.   HTN (hypertension)    Hyperlipidemia    Mild anemia    Hx of post op. 8/08   Paroxysmal atrial fibrillation (HCC)    only episode noted breifly during hospitalization for PCM placement 11/10.    Sick sinus syndrome (HCC)    with presyncope. medtronic dual chamber PCM placed 11/10   Type II or unspecified type diabetes mellitus without mention of complication, not stated as uncontrolled    Past Surgical History:  Procedure Laterality Date   CORONARY ARTERY BYPASS GRAFT     PACEMAKER INSERTION     MDT implanted 2010   rectal abcess     2005. Dr. Carolynne Edouard with interanal sphincterotomy    reports that he quit smoking about 16 years ago. His smoking use included cigarettes. He started smoking about 36 years ago. He has a 20 pack-year smoking history. He has never used smokeless tobacco. He reports current alcohol use of about 2.0 standard drinks of  alcohol per week. He reports that he does not use drugs. Family history is unknown by patient. Allergies  Allergen Reactions   Metformin And Related     rash   Current Outpatient Medications on File Prior to Visit  Medication Sig Dispense Refill   amLODipine (NORVASC) 5 MG tablet Take 1 tablet (5 mg total) by mouth daily. 90 tablet 3   aspirin 81 MG tablet Take 1 tablet (81 mg total) by mouth daily.     atorvastatin (LIPITOR) 40 MG tablet TAKE 1 TABLET BY MOUTH DAILY AT 6 PM 90 tablet 3   Cholecalciferol (THERA-D 2000) 50 MCG (2000 UT) TABS 1 tab by mouth once per day 90 tablet 99   dapagliflozin propanediol (FARXIGA) 10 MG TABS tablet Take 1 tablet (10 mg total) by mouth daily before breakfast. 90 tablet 3   ezetimibe (ZETIA) 10 MG tablet Take 1 tablet (10 mg total) by mouth daily. 90 tablet 3   fenofibrate (TRICOR) 145 MG tablet 1 tab by mouth once daily 90 tablet 3   lisinopril (ZESTRIL) 40 MG tablet Take 1 tablet (40 mg total) by mouth daily. 90 tablet 3   loratadine (CLARITIN) 10 MG tablet Take 1 tablet (10 mg total) by mouth daily.  30 tablet 2   metoprolol tartrate (LOPRESSOR) 25 MG tablet Take 1 tablet (25 mg total) by mouth 2 (two) times daily. 180 tablet 3   nitroGLYCERIN (NITROSTAT) 0.4 MG SL tablet Place 1 tablet (0.4 mg total) under the tongue every 5 (five) minutes as needed for chest pain. 15 tablet 3   Omega-3 Fatty Acids (OMEGA-3 FISH OIL) 1200 MG CAPS Take 1 capsule by mouth 2 (two) times daily.     omeprazole (PRILOSEC) 20 MG capsule TAKE 1 CAPSULE BY MOUTH TWICE DAILY BEFORE A MEAL 180 capsule 3   pioglitazone (ACTOS) 30 MG tablet Take 1 tablet (30 mg total) by mouth daily. 90 tablet 3   No current facility-administered medications on file prior to visit.        ROS:  All others reviewed and negative.  Objective        PE:  BP (!) 144/70 (BP Location: Left Arm, Patient Position: Sitting, Cuff Size: Normal)   Pulse 79   Temp 98.1 F (36.7 C) (Oral)   Ht 5\' 4"   (1.626 m)   Wt 175 lb 3.2 oz (79.5 kg)   SpO2 94%   BMI 30.07 kg/m                 Constitutional: Pt appears in NAD               HENT: Head: NCAT.                Right Ear: External ear normal.                 Left Ear: External ear normal.                Eyes: . Pupils are equal, round, and reactive to light. Conjunctivae and EOM are normal               Nose: without d/c or deformity               Neck: Neck supple. Gross normal ROM               Cardiovascular: Normal rate and regular rhythm.                 Pulmonary/Chest: Effort normal and breath sounds without rales or wheezing.                Abd:  Soft, NT, ND, + BS, no organomegaly               Neurological: Pt is alert. At baseline orientation, motor grossly intact               Skin: Skin is warm. No rashes, no other new lesions, Guilbault edema - ***               Psychiatric: Pt behavior is normal without agitation   Micro: none  Cardiac tracings I have personally interpreted today:  none  Pertinent Radiological findings (summarize): none   Lab Results  Component Value Date   WBC 6.0 04/01/2022   HGB 13.8 04/01/2022   HCT 41.8 04/01/2022   PLT 299.0 04/01/2022   GLUCOSE 209 (H) 04/01/2022   CHOL 158 04/01/2022   TRIG 124.0 04/01/2022   HDL 63.90 04/01/2022   LDLDIRECT 55.0 12/10/2020   LDLCALC 69 04/01/2022   ALT 14 04/01/2022   AST 16 04/01/2022   NA 135 04/01/2022   K 4.7 04/01/2022   CL 96 04/01/2022  CREATININE 1.19 04/01/2022   BUN 23 04/01/2022   CO2 31 04/01/2022   TSH 1.11 04/01/2022   PSA 0.42 04/01/2022   INR 0.94 11/10/2008   HGBA1C 10.1 (H) 04/01/2022   MICROALBUR <0.7 04/01/2022   Assessment/Plan:  [name] [name] is a 67 y.o. Asian [4] male with  has a past medical history of CAD (coronary artery disease), Cholelithiasis (12/07/2016), GERD (gastroesophageal reflux disease), History of echocardiogram, HTN (hypertension), Hyperlipidemia, Mild anemia, Paroxysmal atrial fibrillation (HCC), Sick  sinus syndrome (HCC), and Type II or unspecified type diabetes mellitus without mention of complication, not stated as uncontrolled.  No problem-specific Assessment & Plan notes found for this encounter.  Followup: No follow-ups on file.  Oliver Barre, MD 10/01/2022 11:03 AM [facility]

## 2022-10-02 ENCOUNTER — Encounter: Payer: Self-pay | Admitting: Internal Medicine

## 2022-10-02 NOTE — Assessment & Plan Note (Signed)
Mild to mod, for allegra and /or nasacort asd,  to f/u any worsening symptoms or concerns

## 2022-10-02 NOTE — Assessment & Plan Note (Signed)
Last vitamin D Lab Results  Component Value Date   VD25OH 59.86 10/01/2022   Stable, cont oral replacement

## 2022-10-02 NOTE — Assessment & Plan Note (Signed)
Lab Results  Component Value Date   LDLCALC 32 10/01/2022   Stable, pt to continue current statin lipitor 40 every day, zetia 10 qd

## 2022-10-02 NOTE — Assessment & Plan Note (Signed)
Lab Results  Component Value Date   HGBA1C 9.0 (H) 10/01/2022   Uncontrolled,, pt to continue current medical treatment farxiga 10 every day, and add actos 45 qd

## 2022-10-13 ENCOUNTER — Encounter: Payer: Self-pay | Admitting: Cardiology

## 2022-10-13 ENCOUNTER — Ambulatory Visit: Payer: Medicare (Managed Care) | Attending: Cardiology | Admitting: Cardiology

## 2022-10-13 VITALS — BP 128/66 | HR 79 | Ht 64.0 in | Wt 176.2 lb

## 2022-10-13 DIAGNOSIS — I480 Paroxysmal atrial fibrillation: Secondary | ICD-10-CM

## 2022-10-13 DIAGNOSIS — E781 Pure hyperglyceridemia: Secondary | ICD-10-CM

## 2022-10-13 DIAGNOSIS — I10 Essential (primary) hypertension: Secondary | ICD-10-CM | POA: Diagnosis not present

## 2022-10-13 DIAGNOSIS — I739 Peripheral vascular disease, unspecified: Secondary | ICD-10-CM

## 2022-10-13 NOTE — Progress Notes (Signed)
Cardiology Office Note:    Date:  10/13/2022   ID:  [name] [name], [name] [name] 1955-03-25, MRN [id number]  PCP:  Corwin Levins, MD  Cardiologist:  Thomasene Ripple, DO  Electrophysiologist:  Hillis Range, MD (Inactive)   Referring MD: Corwin Levins, MD   " I am doing well"  History of Present Illness:    [name] [name] is a 67 y.o. male with a history of coronary artery disease status post coronary artery bypass graft in 2008 with LIMA to LAD, SVG to OM1 and OM 2, SVG to RCA,  symptomatic bradycardia status post pacemaker, hypertension, postoperative atrial fibrillation, hyperlipidemia, and diabetes, presents for a routine follow-up. He has not been hospitalized since his last visit in November 2022 and has been regularly attending his six-monthly pacemaker check-ups. He reports adherence to his prescribed medication regimen, including fenofibrate for hyperlipidemia. Despite this, recent blood work from September 2022 showed elevated triglycerides. He denies a high carbohydrate or sugar diet.    Past Medical History:  Diagnosis Date   CAD (coronary artery disease)    The patient presented in Aug 2008 with acute cornary syndrome. He did have a bypass surgeru. He did have a 3-vessel disease on heart cathiterization and had bypass surgery with LIMA to the LAD, sequential vein graft to the obtuse marginal - 1 and obtuse marginal -2, and vein graft to the RCA. Adenosine myoview (11/10): EF 64%, normal wall motion, normal perfusion.    Cholelithiasis 12/07/2016   GERD (gastroesophageal reflux disease)    History of echocardiogram    November 2008. EF 65-70%. no regional wall abnormalities. Trivial mitral regurgitation.   HTN (hypertension)    Hyperlipidemia    Mild anemia    Hx of post op. 8/08   Paroxysmal atrial fibrillation (HCC)    only episode noted breifly during hospitalization for PCM placement 11/10.    Sick sinus syndrome (HCC)    with presyncope. medtronic dual chamber PCM placed 11/10   Type II or  unspecified type diabetes mellitus without mention of complication, not stated as uncontrolled     Past Surgical History:  Procedure Laterality Date   CORONARY ARTERY BYPASS GRAFT     PACEMAKER INSERTION     MDT implanted 2010   rectal abcess     2005. Dr. Carolynne Edouard with interanal sphincterotomy    Current Medications: Current Meds  Medication Sig   amLODipine (NORVASC) 5 MG tablet Take 1 tablet (5 mg total) by mouth daily.   aspirin 81 MG tablet Take 1 tablet (81 mg total) by mouth daily.   atorvastatin (LIPITOR) 40 MG tablet TAKE 1 TABLET BY MOUTH DAILY AT 6 PM   Cholecalciferol (THERA-D 2000) 50 MCG (2000 UT) TABS 1 tab by mouth once per day   dapagliflozin propanediol (FARXIGA) 10 MG TABS tablet Take 1 tablet (10 mg total) by mouth daily before breakfast.   fenofibrate (TRICOR) 145 MG tablet 1 tab by mouth once daily   lisinopril (ZESTRIL) 40 MG tablet Take 1 tablet (40 mg total) by mouth daily.   loratadine (CLARITIN) 10 MG tablet Take 1 tablet (10 mg total) by mouth daily.   metoprolol tartrate (LOPRESSOR) 25 MG tablet Take 1 tablet (25 mg total) by mouth 2 (two) times daily.   Omega-3 Fatty Acids (OMEGA-3 FISH OIL) 1200 MG CAPS Take 1 capsule by mouth 2 (two) times daily.   omeprazole (PRILOSEC) 20 MG capsule TAKE 1 CAPSULE BY MOUTH TWICE DAILY BEFORE A MEAL   pioglitazone (  ACTOS) 45 MG tablet Take 1 tablet (45 mg total) by mouth daily.     Allergies:   Metformin and related   Social History   Socioeconomic History   Marital status: Married    Spouse name: Not on file   Number of children: 3   Years of education: Not on file   Highest education level: Not on file  Occupational History   Occupation: Retired  Tobacco Use   Smoking status: Former    Current packs/day: 0.00    Average packs/day: 1 pack/day for 20.0 years (20.0 ttl pk-yrs)    Types: Cigarettes    Start date: 01/11/1986    Quit date: 01/11/2006    Years since quitting: 16.7   Smokeless tobacco: Never  Vaping  Use   Vaping status: Never Used  Substance and Sexual Activity   Alcohol use: Yes    Alcohol/week: 2.0 standard drinks of alcohol    Types: 2 Cans of beer per week    Comment: social on weekends   Drug use: No   Sexual activity: Not on file  Other Topics Concern   Not on file  Social History Narrative   Married, 3 children. To [place] from [place] 1979. Work- sign spray pain- Copywriter, advertising   Social Determinants of Health   Financial Resource Strain: Low Risk  (09/09/2022)   Overall Financial Resource Strain (CARDIA)    Difficulty of Paying Living Expenses: Not hard at all  Food Insecurity: No Food Insecurity (09/09/2022)   Hunger Vital Sign    Worried About Running Out of Food in the Last Year: Never true    Ran Out of Food in the Last Year: Never true  Transportation Needs: No Transportation Needs (09/09/2022)   PRAPARE - Administrator, Civil Service (Medical): No    Lack of Transportation (Non-Medical): No  Physical Activity: Insufficiently Active (09/09/2022)   Exercise Vital Sign    Days of Exercise per Week: 2 days    Minutes of Exercise per Session: 30 min  Stress: No Stress Concern Present (09/09/2022)   [employer] of Occupational Health - Occupational Stress Questionnaire    Feeling of Stress : Not at all  Social Connections: Moderately Integrated (09/09/2022)   Social Connection and Isolation Panel [NHANES]    Frequency of Communication with Friends and Family: Three times a week    Frequency of Social Gatherings with Friends and Family: Never    Attends Religious Services: More than 4 times per year    Active Member of [employer] or Organizations: No    Attends Banker Meetings: Never    Marital Status: Married     Family History: The patient's Family history is unknown by patient.  ROS:   Review of Systems  Constitution: Negative for decreased appetite, fever and weight gain.  HENT: Negative for congestion, ear discharge, hoarse voice and  sore throat.   Eyes: Negative for discharge, redness, vision loss in right eye and visual halos.  Cardiovascular: Negative for chest pain, dyspnea on exertion, leg swelling, orthopnea and palpitations.  Respiratory: Negative for cough, hemoptysis, shortness of breath and snoring.   Endocrine: Negative for heat intolerance and polyphagia.  Hematologic/Lymphatic: Negative for bleeding problem. Does not bruise/bleed easily.  Skin: Negative for flushing, nail changes, rash and suspicious lesions.  Musculoskeletal: Negative for arthritis, joint pain, muscle cramps, myalgias, neck pain and stiffness.  Gastrointestinal: Negative for abdominal pain, bowel incontinence, diarrhea and excessive appetite.  Genitourinary: Negative for decreased libido,  genital sores and incomplete emptying.  Neurological: Negative for brief paralysis, focal weakness, headaches and loss of balance.  Psychiatric/Behavioral: Negative for altered mental status, depression and suicidal ideas.  Allergic/Immunologic: Negative for HIV exposure and persistent infections.    EKGs/Labs/Other Studies Reviewed:    The following studies were reviewed today:   EKG:  The ekg ordered today demonstrates   Recent Labs: 04/01/2022: Hemoglobin 13.8; Platelets 299.0; TSH 1.11 10/01/2022: ALT 13; BUN 18; Creatinine, Ser 1.10; Potassium 4.4; Sodium 136  Recent Lipid Panel    Component Value Date/Time   CHOL 142 10/01/2022 1120   TRIG 223.0 (H) 10/01/2022 1120   HDL 65.30 10/01/2022 1120   CHOLHDL 2 10/01/2022 1120   VLDL 44.6 (H) 10/01/2022 1120   LDLCALC 32 10/01/2022 1120   LDLDIRECT 55.0 12/10/2020 1212    Physical Exam:    VS:  BP 128/66 (BP Location: Left Arm, Patient Position: Sitting, Cuff Size: Normal)   Pulse 79   Ht 5\' 4"  (1.626 m)   Wt 176 lb 3.2 oz (79.9 kg)   SpO2 98%   BMI 30.24 kg/m     Wt Readings from Last 3 Encounters:  10/13/22 176 lb 3.2 oz (79.9 kg)  10/01/22 175 lb 3.2 oz (79.5 kg)  09/09/22 172 lb  (78 kg)     GEN: Well nourished, well developed in no acute distress HEENT: Normal NECK: No JVD; No carotid bruits LYMPHATICS: No lymphadenopathy CARDIAC: S1S2 noted,RRR, no murmurs, rubs, gallops RESPIRATORY:  Clear to auscultation without rales, wheezing or rhonchi  ABDOMEN: Soft, non-tender, non-distended, +bowel sounds, no guarding. EXTREMITIES: No edema, No cyanosis, no clubbing MUSCULOSKELETAL:  No deformity  SKIN: Warm and dry NEUROLOGIC:  Alert and oriented x 3, non-focal PSYCHIATRIC:  Normal affect, good insight  ASSESSMENT:    No diagnosis found. PLAN:    Hypertriglyceridemia Elevated triglycerides despite adherence to Fenofibrate. Discussed the importance of dietary modifications, specifically reducing carbohydrate intake. Repeat lipid panel in 12 weeks. If triglycerides remain elevated, consider additional pharmacotherapy to reduce risk of cardiovascular events.  Coronary Artery Disease (CAD) status post Coronary Artery Bypass Graft (CABG) in 2008 No new symptoms or hospitalizations since last visit. Continue current cardiovascular medications as prescribed by primary care physician.  Atrial Fibrillation (Afib) and Hypertension No new symptoms or changes in management discussed. Continue current management as prescribed by primary care physician.  Diabetes Mellitus No changes in management discussed. Continue current management as prescribed by primary care physician.  General Health Maintenance / Followup Plans Encouraged to not let more than two years pass before next cardiology follow-up. Continue regular pacemaker checks every six months.   The patient is in agreement with the above plan. The patient left the office in stable condition.  The patient will follow up in   Medication Adjustments/Labs and Tests Ordered: Current medicines are reviewed at length with the patient today.  Concerns regarding medicines are outlined above.  No orders of the defined  types were placed in this encounter.  No orders of the defined types were placed in this encounter.   There are no Patient Instructions on file for this visit.   Adopting a Healthy Lifestyle.  Know what a healthy weight is for you (roughly BMI <25) and aim to maintain this   Aim for 7+ servings of fruits and vegetables daily   65-80+ fluid ounces of water or unsweet tea for healthy kidneys   Limit to max 1 drink of alcohol per day; avoid smoking/tobacco   Limit  animal fats in diet for cholesterol and heart health - choose grass fed whenever available   Avoid highly processed foods, and foods high in saturated/trans fats   Aim for low stress - take time to unwind and care for your mental health   Aim for 150 min of moderate intensity exercise weekly for heart health, and weights twice weekly for bone health   Aim for 7-9 hours of sleep daily   When it comes to diets, agreement about the perfect plan isnt easy to find, even among the experts. Experts at the [facility] of [employer] developed an idea known as the Healthy Eating Plate. Just imagine a plate divided into logical, healthy portions.   The emphasis is on diet quality:   Load up on vegetables and fruits - one-half of your plate: Aim for color and variety, and remember that potatoes dont count.   Go for whole grains - one-quarter of your plate: Whole wheat, barley, wheat berries, quinoa, oats, brown rice, and foods made with them. If you want pasta, go with whole wheat pasta.   Protein power - one-quarter of your plate: Fish, chicken, beans, and nuts are all healthy, versatile protein sources. Limit red meat.   The diet, however, does go beyond the plate, offering a few other suggestions.   Use healthy plant oils, such as olive, canola, soy, corn, sunflower and peanut. Check the labels, and avoid partially hydrogenated oil, which have unhealthy trans fats.   If youre thirsty, drink water. Coffee and tea are  good in moderation, but skip sugary drinks and limit milk and dairy products to one or two daily servings.   The type of carbohydrate in the diet is more important than the amount. Some sources of carbohydrates, such as vegetables, fruits, whole grains, and beans-are healthier than others.   Finally, stay active  Signed, Thomasene Ripple, DO  10/13/2022 9:41 AM    [facility]

## 2022-10-13 NOTE — Patient Instructions (Addendum)
Medication Instructions:  Your physician recommends that you continue on your current medications as directed. Please refer to the Current Medication list given to you today.  *If you need a refill on your cardiac medications before your next appointment, please call your pharmacy*   Lab Work: Lipid Panel in 12 Weeks (Week of January 03, 2023)   If you have labs (blood work) drawn today and your tests are completely normal, you will receive your results only by: MyChart Message (if you have MyChart) OR A paper copy in the mail If you have any lab test that is abnormal or we need to change your treatment, we will call you to review the results.   Testing/Procedures: None   Follow-Up: At [facility], you and your health needs are our priority.  As part of our continuing mission to provide you with exceptional heart care, we have created designated Provider Care Teams.  These Care Teams include your primary Cardiologist (physician) and Advanced Practice Providers (APPs -  Physician Assistants and Nurse Practitioners) who all work together to provide you with the care you need, when you need it.     Your next appointment:   12 month(s)  Provider:   Thomasene Ripple, DO

## 2022-10-29 ENCOUNTER — Other Ambulatory Visit: Payer: Self-pay | Admitting: Internal Medicine

## 2022-11-10 ENCOUNTER — Ambulatory Visit (INDEPENDENT_AMBULATORY_CARE_PROVIDER_SITE_OTHER): Payer: Medicare (Managed Care)

## 2022-11-10 DIAGNOSIS — I495 Sick sinus syndrome: Secondary | ICD-10-CM

## 2022-11-12 LAB — CUP PACEART REMOTE DEVICE CHECK
Battery Impedance: 3438 Ohm
Battery Remaining Longevity: 20 mo
Battery Voltage: 2.73 V
Brady Statistic AP VP Percent: 0 %
Brady Statistic AP VS Percent: 59 %
Brady Statistic AS VP Percent: 0 %
Brady Statistic AS VS Percent: 40 %
Date Time Interrogation Session: 20241031130443
Implantable Lead Connection Status: 753985
Implantable Lead Connection Status: 753985
Implantable Lead Implant Date: 20101101
Implantable Lead Implant Date: 20101101
Implantable Lead Location: 753859
Implantable Lead Location: 753860
Implantable Lead Model: 5076
Implantable Lead Model: 5076
Implantable Pulse Generator Implant Date: 20101101
Lead Channel Impedance Value: 531 Ohm
Lead Channel Impedance Value: 576 Ohm
Lead Channel Pacing Threshold Amplitude: 0.625 V
Lead Channel Pacing Threshold Amplitude: 1 V
Lead Channel Pacing Threshold Pulse Width: 0.4 ms
Lead Channel Pacing Threshold Pulse Width: 0.4 ms
Lead Channel Setting Pacing Amplitude: 2 V
Lead Channel Setting Pacing Amplitude: 2.5 V
Lead Channel Setting Pacing Pulse Width: 0.4 ms
Lead Channel Setting Sensing Sensitivity: 4 mV
Zone Setting Status: 755011
Zone Setting Status: 755011

## 2022-11-26 ENCOUNTER — Ambulatory Visit (AMBULATORY_SURGERY_CENTER): Payer: Medicare (Managed Care) | Admitting: *Deleted

## 2022-11-26 VITALS — Ht 64.0 in | Wt 174.0 lb

## 2022-11-26 DIAGNOSIS — Z1211 Encounter for screening for malignant neoplasm of colon: Secondary | ICD-10-CM

## 2022-11-26 MED ORDER — NA SULFATE-K SULFATE-MG SULF 17.5-3.13-1.6 GM/177ML PO SOLN: 1.0000 | Freq: Once | ORAL | 0 refills | Status: AC

## 2022-11-26 NOTE — Progress Notes (Signed)
Pt's name and DOB verified at the beginning of the pre-visit wit 2 identifiers Pt denies any difficulty with ambulating,sitting, laying down or rolling side to side Pt has issues with ambulation  Pt has no issues moving head neck or swallowing No egg or soy allergy known to patient  No issues known to pt with past sedation with any surgeries or procedures Pt denies having issues being intubated No FH of Malignant Hyperthermia Pt is not on diet pills or shots Pt is not on home 02  Pt denies issues with constipation  Pt is not on dialysis Phx of PAFIB Pt denies any upcoming cardiac testing Pt encouraged to use to use Singlecare or Goodrx to reduce cost  Patient's chart reviewed by Cathlyn Parsons CNRA prior to pre-visit and patient appropriate for the [facility].  Pre-visit completed and red dot placed by patient's name on their procedure day (on provider's schedule).  . Visit in person Pt scale weight is  Instructed pt why it is important to and  to call if they have any changes in health or new medications. Directed them to the # given and on instructions.    Instructions reviewed. Pt given both [facility] main # and MD on call # prior to instructions.  Pt states understanding. Instructed to review again prior to procedure. Pt states they will. Instructions and coupon given to pt   Instructions sent through My Chart

## 2022-11-30 NOTE — Progress Notes (Signed)
Remote pacemaker transmission.   

## 2022-12-16 ENCOUNTER — Ambulatory Visit: Payer: Medicare (Managed Care) | Admitting: Gastroenterology

## 2022-12-16 ENCOUNTER — Encounter: Payer: Self-pay | Admitting: Gastroenterology

## 2022-12-16 VITALS — BP 110/62 | HR 72 | Temp 97.3°F | Resp 16 | Ht 64.0 in | Wt 174.0 lb

## 2022-12-16 DIAGNOSIS — Z1211 Encounter for screening for malignant neoplasm of colon: Secondary | ICD-10-CM | POA: Diagnosis present

## 2022-12-16 DIAGNOSIS — K5730 Diverticulosis of large intestine without perforation or abscess without bleeding: Secondary | ICD-10-CM

## 2022-12-16 MED ORDER — SODIUM CHLORIDE 0.9 % IV SOLN: 500.0000 mL | Freq: Once | INTRAVENOUS | Status: DC

## 2022-12-16 NOTE — Op Note (Signed)
[facility] Patient Name: [name] [name] Procedure Date: 12/16/2022 9:11 AM MRN: [id number] Endoscopist: Lorin Picket E. Tomasa Rand , MD, [id number] Age: 67 Referring MD:  Date of Birth: 02/02/1955 Gender: Male Account #: [id number] Procedure:                Colonoscopy Indications:              Screening for colorectal malignant neoplasm (last                            colonoscopy was more than 10 years ago; normal                            colonoscopy April 2013) Medicines:                Monitored Anesthesia Care Procedure:                Pre-Anesthesia Assessment:                           - Prior to the procedure, a History and Physical                            was performed, and patient medications and                            allergies were reviewed. The patient's tolerance of                            previous anesthesia was also reviewed. The risks                            and benefits of the procedure and the sedation                            options and risks were discussed with the patient.                            All questions were answered, and informed consent                            was obtained. Prior Anticoagulants: The patient has                            taken no anticoagulant or antiplatelet agents                            except for aspirin. ASA Grade Assessment: III - A                            patient with severe systemic disease. After                            reviewing the risks and benefits, the patient was  deemed in satisfactory condition to undergo the                            procedure.                           After obtaining informed consent, the colonoscope                            was passed under direct vision. Throughout the                            procedure, the patient's blood pressure, pulse, and                            oxygen saturations were monitored continuously. The                             Olympus Scope SN [id number] was introduced through the                            anus and advanced to the the terminal ileum, with                            identification of the appendiceal orifice and IC                            valve. The colonoscopy was performed without                            difficulty. The patient tolerated the procedure                            well. The quality of the bowel preparation was                            good. The terminal ileum, ileocecal valve,                            appendiceal orifice, and rectum were photographed.                            The bowel preparation used was SUPREP via split                            dose instruction. Scope In: 9:27:13 AM Scope Out: 9:35:14 AM Scope Withdrawal Time: 0 hours 6 minutes 6 seconds  Total Procedure Duration: 0 hours 8 minutes 1 second  Findings:                 The perianal and digital rectal examinations were                            normal. Pertinent negatives include normal  sphincter tone and no palpable rectal lesions.                           A single medium-mouthed diverticulum was found in                            the ascending colon.                           The exam was otherwise normal throughout the                            examined colon.                           The terminal ileum appeared normal.                           The retroflexed view of the distal rectum and anal                            verge was normal and showed no anal or rectal                            abnormalities. Complications:            No immediate complications. Estimated Blood Loss:     Estimated blood loss: none. Impression:               - Diverticulosis in the ascending colon.                           - The examination was otherwise normal.                           - The examined portion of the ileum was normal.                           - The distal  rectum and anal verge are normal on                            retroflexion view.                           - No specimens collected. Recommendation:           - Patient has a contact number available for                            emergencies. The signs and symptoms of potential                            delayed complications were discussed with the                            patient. Return to normal activities tomorrow.  Written discharge instructions were provided to the                            patient.                           - Resume previous diet.                           - Continue present medications.                           - Given patient's age and lack of polyps, I would                            recommend against any further colon cancer screening Josseline Reddin E. Tomasa Rand, MD 12/16/2022 9:42:43 AM This report has been signed electronically.

## 2022-12-16 NOTE — Progress Notes (Signed)
Sedate, gd SR, tolerated procedure well, VSS, report to RN 

## 2022-12-16 NOTE — Patient Instructions (Addendum)
Recommendation:  - Patient has a contact number available for emergencies. The signs and symptoms of potential delayed complications were discussed with the patient. Return to normal activities tomorrow. Written discharge instructions were provided to the patient.  - Resume previous diet.  - Continue present medications.  - Given patient' s age and lack of polyps, I would recommend against any further colon cancer screening  YOU HAD AN ENDOSCOPIC PROCEDURE TODAY AT THE  ENDOSCOPY CENTER:   Refer to the procedure report that was given to you for any specific questions about what was found during the examination.  If the procedure report does not answer your questions, please call your gastroenterologist to clarify.  If you requested that your care partner not be given the details of your procedure findings, then the procedure report has been included in a sealed envelope for you to review at your convenience later.  YOU SHOULD EXPECT: Some feelings of bloating in the abdomen. Passage of more gas than usual.  Walking can help get rid of the air that was put into your GI tract during the procedure and reduce the bloating. If you had a lower endoscopy (such as a colonoscopy or flexible sigmoidoscopy) you may notice spotting of blood in your stool or on the toilet paper. If you underwent a bowel prep for your procedure, you may not have a normal bowel movement for a few days.  Please Note:  You might notice some irritation and congestion in your nose or some drainage.  This is from the oxygen used during your procedure.  There is no need for concern and it should clear up in a day or so.  SYMPTOMS TO REPORT IMMEDIATELY:  Following lower endoscopy (colonoscopy or flexible sigmoidoscopy):  Excessive amounts of blood in the stool  Significant tenderness or worsening of abdominal pains  Swelling of the abdomen that is new, acute  Fever of 100F or higher   For urgent or emergent issues, a  gastroenterologist can be reached at any hour by calling [phone]. Do not use MyChart messaging for urgent concerns.    DIET:  We do recommend a small meal at first, but then you may proceed to your regular diet.  Drink plenty of fluids but you should avoid alcoholic beverages for 24 hours.  MEDICATIONS: Continue present medications.  Please see handouts given to you by your recovery nurse: Diverticulosis.  FOLLOW UP: Given your age and lack of polyps, I would recommend against further routine colon cancer screening.  Thank you for allowing [place] to provide for your healthcare needs today.  ACTIVITY:  You should plan to take it easy for the rest of today and you should NOT DRIVE or use heavy machinery until tomorrow (because of the sedation medicines used during the test).    FOLLOW UP: Our staff will call the number listed on your records the next business day following your procedure.  We will call around 7:15- 8:00 am to check on you and address any questions or concerns that you may have regarding the information given to you following your procedure. If we do not reach you, we will leave a message.     If any biopsies were taken you will be contacted by phone or by letter within the next 1-3 weeks.  Please call us at [phone] if you have not heard about the biopsies in 3 weeks.    SIGNATURES/CONFIDENTIALITY: You and/or your care partner have signed paperwork which will be entered into your  electronic medical record.  These signatures attest to the fact that that the information above on your After Visit Summary has been reviewed and is understood.  Full responsibility of the confidentiality of this discharge information lies with you and/or your care-partner.

## 2022-12-16 NOTE — Progress Notes (Signed)
[facility] History and Physical   Primary Care Physician:  Corwin Levins, MD   Reason for Procedure:   Colon cancer screening  Plan:    Screening colonoscopy     HPI: [name] [name] is a 67 y.o. male undergoing average risk screening colonoscopy.  He has no family history of colon cancer and no chronic GI symptoms.  He had a normal colonoscopy by Dr. Christella Hartigan in April 2013.   Past Medical History:  Diagnosis Date   CAD (coronary artery disease)    The patient presented in Aug 2008 with acute cornary syndrome. He did have a bypass surgeru. He did have a 3-vessel disease on heart cathiterization and had bypass surgery with LIMA to the LAD, sequential vein graft to the obtuse marginal - 1 and obtuse marginal -2, and vein graft to the RCA. Adenosine myoview (11/10): EF 64%, normal wall motion, normal perfusion.    Cholelithiasis 12/07/2016   GERD (gastroesophageal reflux disease)    History of echocardiogram    November 2008. EF 65-70%. no regional wall abnormalities. Trivial mitral regurgitation.   HTN (hypertension)    Hyperlipidemia    Mild anemia    Hx of post op. 8/08   Paroxysmal atrial fibrillation (HCC)    only episode noted breifly during hospitalization for PCM placement 11/10.    Sick sinus syndrome (HCC)    with presyncope. medtronic dual chamber PCM placed 11/10   Type II or unspecified type diabetes mellitus without mention of complication, not stated as uncontrolled     Past Surgical History:  Procedure Laterality Date   CORONARY ARTERY BYPASS GRAFT     PACEMAKER INSERTION     MDT implanted 2010   rectal abcess     2005. Dr. Carolynne Edouard with interanal sphincterotomy    Prior to Admission medications   Medication Sig Start Date End Date Taking? Authorizing Provider  amLODipine (NORVASC) 5 MG tablet Take 1 tablet (5 mg total) by mouth daily. 04/01/22  Yes Corwin Levins, MD  aspirin 81 MG tablet Take 1 tablet (81 mg total) by mouth daily. 10/15/13  Yes Allred,  Fayrene Fearing, MD  atorvastatin (LIPITOR) 40 MG tablet TAKE 1 TABLET BY MOUTH DAILY AT 6 PM 04/01/22  Yes Corwin Levins, MD  Cholecalciferol (THERA-D 2000) 50 MCG (2000 UT) TABS 1 tab by mouth once per day 06/06/20  Yes Corwin Levins, MD  dapagliflozin propanediol (FARXIGA) 10 MG TABS tablet Take 1 tablet (10 mg total) by mouth daily before breakfast. 04/02/22  Yes Corwin Levins, MD  fenofibrate (TRICOR) 145 MG tablet 1 tab by mouth once daily 04/01/22  Yes Corwin Levins, MD  lisinopril (ZESTRIL) 40 MG tablet Take 1 tablet (40 mg total) by mouth daily. 04/01/22  Yes Corwin Levins, MD  metoprolol tartrate (LOPRESSOR) 25 MG tablet Take 1 tablet (25 mg total) by mouth 2 (two) times daily. 04/01/22  Yes Corwin Levins, MD  Omega-3 Fatty Acids (OMEGA-3 FISH OIL) 1200 MG CAPS Take 1 capsule by mouth 2 (two) times daily.   Yes [provider]  omeprazole (PRILOSEC) 20 MG capsule TAKE 1 CAPSULE BY MOUTH TWICE DAILY BEFORE A MEAL 06/04/22  Yes Corwin Levins, MD  pioglitazone (ACTOS) 45 MG tablet Take 1 tablet (45 mg total) by mouth daily. 10/01/22  Yes Corwin Levins, MD  vitamin B-12 (CYANOCOBALAMIN) 50 MCG tablet Take 50 mcg by mouth daily.   Yes [provider]  ezetimibe (ZETIA) 10 MG tablet  Take 1 tablet (10 mg total) by mouth daily. 06/17/22 10/01/22  Corwin Levins, MD  loratadine (CLARITIN) 10 MG tablet Take 1 tablet (10 mg total) by mouth daily. 05/04/16   Plotnikov, Georgina Quint, MD  nitroGLYCERIN (NITROSTAT) 0.4 MG SL tablet Place 1 tablet (0.4 mg total) under the tongue every 5 (five) minutes as needed for chest pain. 11/17/18 10/01/22  Alver Sorrow, NP    Current Outpatient Medications  Medication Sig Dispense Refill   amLODipine (NORVASC) 5 MG tablet Take 1 tablet (5 mg total) by mouth daily. 90 tablet 3   aspirin 81 MG tablet Take 1 tablet (81 mg total) by mouth daily.     atorvastatin (LIPITOR) 40 MG tablet TAKE 1 TABLET BY MOUTH DAILY AT 6 PM 90 tablet 3   Cholecalciferol (THERA-D 2000) 50  MCG (2000 UT) TABS 1 tab by mouth once per day 90 tablet 99   dapagliflozin propanediol (FARXIGA) 10 MG TABS tablet Take 1 tablet (10 mg total) by mouth daily before breakfast. 90 tablet 3   fenofibrate (TRICOR) 145 MG tablet 1 tab by mouth once daily 90 tablet 3   lisinopril (ZESTRIL) 40 MG tablet Take 1 tablet (40 mg total) by mouth daily. 90 tablet 3   metoprolol tartrate (LOPRESSOR) 25 MG tablet Take 1 tablet (25 mg total) by mouth 2 (two) times daily. 180 tablet 3   Omega-3 Fatty Acids (OMEGA-3 FISH OIL) 1200 MG CAPS Take 1 capsule by mouth 2 (two) times daily.     omeprazole (PRILOSEC) 20 MG capsule TAKE 1 CAPSULE BY MOUTH TWICE DAILY BEFORE A MEAL 180 capsule 3   pioglitazone (ACTOS) 45 MG tablet Take 1 tablet (45 mg total) by mouth daily. 90 tablet 3   vitamin B-12 (CYANOCOBALAMIN) 50 MCG tablet Take 50 mcg by mouth daily.     ezetimibe (ZETIA) 10 MG tablet Take 1 tablet (10 mg total) by mouth daily. 90 tablet 3   loratadine (CLARITIN) 10 MG tablet Take 1 tablet (10 mg total) by mouth daily. 30 tablet 2   nitroGLYCERIN (NITROSTAT) 0.4 MG SL tablet Place 1 tablet (0.4 mg total) under the tongue every 5 (five) minutes as needed for chest pain. 15 tablet 3   Current Facility-Administered Medications  Medication Dose Route Frequency Provider Last Rate Last Admin   0.9 %  sodium chloride infusion  500 mL Intravenous Once Jenel Lucks, MD        Allergies as of 12/16/2022 - Review Complete 12/16/2022  Allergen Reaction Noted   Metformin and related  05/04/2016    Family History  Problem Relation Age of Onset   Colon cancer Neg Hx    Colon polyps Neg Hx    Esophageal cancer Neg Hx    Rectal cancer Neg Hx    Stomach cancer Neg Hx     Social History   Socioeconomic History   Marital status: Married    Spouse name: Not on file   Number of children: 3   Years of education: Not on file   Highest education level: Not on file  Occupational History   Occupation: Retired   Tobacco Use   Smoking status: Former    Current packs/day: 0.00    Average packs/day: 1 pack/day for 20.0 years (20.0 ttl pk-yrs)    Types: Cigarettes    Start date: 01/11/1986    Quit date: 01/11/2006    Years since quitting: 16.9   Smokeless tobacco: Never  Vaping Use   Vaping  status: Never Used  Substance and Sexual Activity   Alcohol use: Yes    Alcohol/week: 2.0 standard drinks of alcohol    Types: 2 Cans of beer per week    Comment: social on weekends   Drug use: No   Sexual activity: Not on file  Other Topics Concern   Not on file  Social History Narrative   Married, 3 children. To [place] from [place] 1979. Work- sign spray pain- Copywriter, advertising   Social Determinants of Health   Financial Resource Strain: Low Risk  (09/09/2022)   Overall Financial Resource Strain (CARDIA)    Difficulty of Paying Living Expenses: Not hard at all  Food Insecurity: No Food Insecurity (09/09/2022)   Hunger Vital Sign    Worried About Running Out of Food in the Last Year: Never true    Ran Out of Food in the Last Year: Never true  Transportation Needs: No Transportation Needs (09/09/2022)   PRAPARE - Administrator, Civil Service (Medical): No    Lack of Transportation (Non-Medical): No  Physical Activity: Insufficiently Active (09/09/2022)   Exercise Vital Sign    Days of Exercise per Week: 2 days    Minutes of Exercise per Session: 30 min  Stress: No Stress Concern Present (09/09/2022)   [employer] of Occupational Health - Occupational Stress Questionnaire    Feeling of Stress : Not at all  Social Connections: Moderately Integrated (09/09/2022)   Social Connection and Isolation Panel [NHANES]    Frequency of Communication with Friends and Family: Three times a week    Frequency of Social Gatherings with Friends and Family: Never    Attends Religious Services: More than 4 times per year    Active Member of [employer] or Organizations: No    Attends Banker Meetings:  Never    Marital Status: Married  Catering manager Violence: Not At Risk (09/09/2022)   Humiliation, Afraid, Rape, and Kick questionnaire    Fear of Current or Ex-Partner: No    Emotionally Abused: No    Physically Abused: No    Sexually Abused: No    Review of Systems:  All other review of systems negative except as mentioned in the HPI.  Physical Exam: Vital signs BP (!) 135/98   Pulse 74   Temp (!) 97.3 F (36.3 C)   Ht 5\' 4"  (1.626 m)   Wt 174 lb (78.9 kg)   SpO2 99%   BMI 29.87 kg/m   General:   Alert,  Well-developed, well-nourished, pleasant and cooperative in NAD Airway:  Mallampati 2 Lungs:  Clear throughout to auscultation.   Heart:  Regular rate and rhythm; no murmurs, clicks, rubs,  or gallops. Abdomen:  Soft, nontender and nondistended. Normal bowel sounds.   Neuro/Psych:  Normal mood and affect. A and O x 3   Drey Shaff E. Tomasa Rand, MD [facility]

## 2022-12-16 NOTE — Progress Notes (Signed)
Pt's states no medical or surgical changes since previsit or office visit. 

## 2022-12-17 ENCOUNTER — Telehealth: Payer: Self-pay | Admitting: *Deleted

## 2022-12-17 NOTE — Telephone Encounter (Signed)
  Follow up Call-     12/16/2022    8:35 AM  Call back number  Post procedure Call Back phone  # [phone]  Permission to leave phone message Yes     Patient questions:  Do you have a fever, pain , or abdominal swelling? No. Pain Score  0 *  Have you tolerated food without any problems? Yes.    Have you been able to return to your normal activities? Yes.    Do you have any questions about your discharge instructions: Diet   No. Medications  No. Follow up visit  No.  Do you have questions or concerns about your Care? No.  Actions: * If pain score is 4 or above: No action needed, pain <4.

## 2023-03-11 ENCOUNTER — Ambulatory Visit: Payer: Self-pay | Admitting: Internal Medicine

## 2023-03-11 NOTE — Telephone Encounter (Signed)
 Copied from CRM [id number]. Topic: Clinical - Red Word Triage >> Mar 11, 2023 12:42 PM Corin V wrote: [facility] that prompted transfer to Nurse Triage: Patient blood pressure has been running extremely high and he is concerned about head pain dizziness.   Chief Complaint: High blood pressure  Symptoms: Dizziness Frequency: Intermittent  Pertinent Negatives: Patient denies headache  Disposition: [] ED /[] Urgent Care (no appt availability in office) / [] Appointment(In office/virtual)/ []  [facility]/ [x] Home Care/ [] Refused Recommended Disposition /[] [place] [facility]/ []  Follow-up with PCP Additional Notes: Patient reports his blood pressure was 165/82 today. He states that he is also experiencing some dizziness when bending over or turning his head too fast. He denies any headache. Patient has not taken his blood pressure medication today. I advised the patient to take the blood pressure medication and recheck his blood pressure in an hour. Patient instructed to call back if his blood pressure remains elevated or if his symptoms do not improve. Patient verbalized understanding and agreement with this plan.     Reason for Disposition  [1] Systolic BP  >= 130 OR Diastolic >= 80 AND [2] taking BP medications    Patient has not taken BP medication today  Answer Assessment - Initial Assessment Questions 1. BLOOD PRESSURE: "What is the blood pressure?" "Did you take at least two measurements 5 minutes apart?"     165/82 2. ONSET: "When did you take your blood pressure?"     Today 3. HOW: "How did you take your blood pressure?" (e.g., automatic home BP monitor, visiting nurse)     Automatic BP cuff 4. HISTORY: "Do you have a history of high blood pressure?"     Yes 5. MEDICINES: "Are you taking any medicines for blood pressure?" "Have you missed any doses recently?"     Has not missed a dose  6. OTHER SYMPTOMS: "Do you have any symptoms?" (e.g., blurred vision, chest pain,  difficulty breathing, headache, weakness)     Dizziness, headache  Protocols used: Blood Pressure - High-A-AH

## 2023-03-30 ENCOUNTER — Encounter: Payer: Self-pay | Admitting: Internal Medicine

## 2023-03-30 ENCOUNTER — Ambulatory Visit: Payer: Medicare (Managed Care) | Admitting: Internal Medicine

## 2023-03-30 VITALS — BP 140/80 | HR 80 | Temp 98.5°F | Ht 64.0 in | Wt 176.0 lb

## 2023-03-30 DIAGNOSIS — E0800 Diabetes mellitus due to underlying condition with hyperosmolarity without nonketotic hyperglycemic-hyperosmolar coma (NKHHC): Secondary | ICD-10-CM

## 2023-03-30 DIAGNOSIS — E7800 Pure hypercholesterolemia, unspecified: Secondary | ICD-10-CM

## 2023-03-30 DIAGNOSIS — I10 Essential (primary) hypertension: Secondary | ICD-10-CM | POA: Diagnosis not present

## 2023-03-30 DIAGNOSIS — Z125 Encounter for screening for malignant neoplasm of prostate: Secondary | ICD-10-CM | POA: Diagnosis not present

## 2023-03-30 DIAGNOSIS — E559 Vitamin D deficiency, unspecified: Secondary | ICD-10-CM | POA: Diagnosis not present

## 2023-03-30 DIAGNOSIS — E538 Deficiency of other specified B group vitamins: Secondary | ICD-10-CM

## 2023-03-30 DIAGNOSIS — Z0001 Encounter for general adult medical examination with abnormal findings: Secondary | ICD-10-CM

## 2023-03-30 DIAGNOSIS — Z0000 Encounter for general adult medical examination without abnormal findings: Secondary | ICD-10-CM

## 2023-03-30 LAB — VITAMIN B12: Vitamin B-12: 326 pg/mL (ref 211–911)

## 2023-03-30 LAB — LIPID PANEL
Cholesterol: 152 mg/dL (ref 0–200)
HDL: 74.5 mg/dL (ref >39.00)
LDL Cholesterol: 57 mg/dL (ref 0–99)
NonHDL: 77.46
Total CHOL/HDL Ratio: 2
Triglycerides: 102 mg/dL (ref 0.0–149.0)
VLDL: 20.4 mg/dL (ref 0.0–40.0)

## 2023-03-30 LAB — CBC WITH DIFFERENTIAL/PLATELET
Basophils Absolute: 0.1 10*3/uL (ref 0.0–0.1)
Basophils Relative: 0.9 % (ref 0.0–3.0)
Eosinophils Absolute: 0.2 10*3/uL (ref 0.0–0.7)
Eosinophils Relative: 4 % (ref 0.0–5.0)
HCT: 37.5 % — ABNORMAL LOW (ref 39.0–52.0)
Hemoglobin: 12.7 g/dL — ABNORMAL LOW (ref 13.0–17.0)
Lymphocytes Relative: 20 % (ref 12.0–46.0)
Lymphs Abs: 1.2 10*3/uL (ref 0.7–4.0)
MCHC: 33.8 g/dL (ref 30.0–36.0)
MCV: 88.5 fl (ref 78.0–100.0)
Monocytes Absolute: 0.6 10*3/uL (ref 0.1–1.0)
Monocytes Relative: 9.1 % (ref 3.0–12.0)
Neutro Abs: 4 10*3/uL (ref 1.4–7.7)
Neutrophils Relative %: 66 % (ref 43.0–77.0)
Platelets: 298 10*3/uL (ref 150.0–400.0)
RBC: 4.24 Mil/uL (ref 4.22–5.81)
RDW: 13.4 % (ref 11.5–15.5)
WBC: 6 10*3/uL (ref 4.0–10.5)

## 2023-03-30 LAB — BASIC METABOLIC PANEL
BUN: 16 mg/dL (ref 6–23)
CO2: 30 meq/L (ref 19–32)
Calcium: 10.4 mg/dL (ref 8.4–10.5)
Chloride: 99 meq/L (ref 96–112)
Creatinine, Ser: 1 mg/dL (ref 0.40–1.50)
GFR: 77 mL/min (ref >60.00)
Glucose, Bld: 141 mg/dL — ABNORMAL HIGH (ref 70–99)
Potassium: 4.4 meq/L (ref 3.5–5.1)
Sodium: 136 meq/L (ref 135–145)

## 2023-03-30 LAB — MICROALBUMIN / CREATININE URINE RATIO
Creatinine,U: 25.1 mg/dL
Microalb Creat Ratio: UNDETERMINED mg/g (ref 0.0–30.0)
Microalb, Ur: <0.7 mg/dL

## 2023-03-30 LAB — URINALYSIS, ROUTINE W REFLEX MICROSCOPIC
Bilirubin Urine: NEGATIVE
Hgb urine dipstick: NEGATIVE
Ketones, ur: NEGATIVE
Leukocytes,Ua: NEGATIVE
Nitrite: NEGATIVE
RBC / HPF: NONE SEEN (ref 0–?)
Specific Gravity, Urine: 1.01 (ref 1.000–1.030)
Total Protein, Urine: NEGATIVE
Urine Glucose: NEGATIVE
Urobilinogen, UA: 0.2 (ref 0.0–1.0)
WBC, UA: NONE SEEN (ref 0–?)
pH: 7 (ref 5.0–8.0)

## 2023-03-30 LAB — HEPATIC FUNCTION PANEL
ALT: 11 U/L (ref 0–53)
AST: 14 U/L (ref 0–37)
Albumin: 4.7 g/dL (ref 3.5–5.2)
Alkaline Phosphatase: 36 U/L — ABNORMAL LOW (ref 39–117)
Bilirubin, Direct: 0.1 mg/dL (ref 0.0–0.3)
Total Bilirubin: 0.4 mg/dL (ref 0.2–1.2)
Total Protein: 7.8 g/dL (ref 6.0–8.3)

## 2023-03-30 LAB — VITAMIN D 25 HYDROXY (VIT D DEFICIENCY, FRACTURES): VITD: 36.39 ng/mL (ref 30.00–100.00)

## 2023-03-30 LAB — HEMOGLOBIN A1C: Hgb A1c MFr Bld: 8.1 % — ABNORMAL HIGH (ref 4.6–6.5)

## 2023-03-30 LAB — TSH: TSH: 1.86 u[IU]/mL (ref 0.35–5.50)

## 2023-03-30 LAB — PSA: PSA: 0.35 ng/mL (ref 0.10–4.00)

## 2023-03-30 MED ORDER — METOPROLOL TARTRATE 50 MG PO TABS: 50.0000 mg | ORAL_TABLET | Freq: Two times a day (BID) | ORAL | 3 refills | Status: AC

## 2023-03-30 NOTE — Patient Instructions (Signed)
 Ok to increase the metoprolol to 50 mg twice per day  Please continue all other medications as before, and refills have been done if requested.  Please have the pharmacy call with any other refills you may need.  Please continue your efforts at being more active, low cholesterol diet, and weight control.  You are otherwise up to date with prevention measures today.  Please keep your appointments with your specialists as you may have planned  Please go to the LAB at the blood drawing area for the tests to be done \\You  will be contacted by phone if any changes need to be made immediately.  Otherwise, you will receive a letter about your results with an explanation, but please check with MyChart first.  Please make an Appointment to return in 6 months, or sooner if needed .

## 2023-03-30 NOTE — Progress Notes (Signed)
 Patient ID: [name] [name], male   DOB: Dec 10, 1955, 68 y.o.   MRN: [id number]         Chief Complaint:: wellness exam and Medical Management of Chronic Issues (6 month follow up, he wants a prescription to get a blood pressure cuff for home to monitor his blood pressure )  , dm, htn, hld, low vit d       HPI:  [name] [name] is a 68 y.o. male here for wellness exam; plans to call for eye exam soon, for shingrix at pharmacy, o/w up to date                        Also Pt denies chest pain, increased sob or doe, wheezing, orthopnea, PND, increased [place] swelling, palpitations, dizziness or syncope.   Pt denies polydipsia, polyuria, or new focal neuro s/s.    Pt denies fever, wt loss, night sweats, loss of appetite, or other constitutional symptoms  Hard to lose wt.   Wt Readings from Last 3 Encounters:  03/30/23 176 lb (79.8 kg)  12/16/22 174 lb (78.9 kg)  11/26/22 174 lb (78.9 kg)   BP Readings from Last 3 Encounters:  03/30/23 (!) 140/80  12/16/22 110/62  10/13/22 128/66   Immunization History  Administered Date(s) Administered   Influenza Inj Mdck Quad Pf 09/30/2017   Influenza Split 10/27/2020   Influenza Whole 11/11/2008   Influenza, High Dose Seasonal PF 10/08/2020   Influenza,inj,Quad PF,6+ Mos 10/22/2016, 10/17/2018   Influenza-Unspecified 09/11/2013, 10/11/2016, 10/15/2019, 10/20/2021, 09/13/2022   PFIZER(Purple Top)SARS-COV-2 Vaccination 03/09/2019, 04/04/2019, 11/16/2019   PNEUMOCOCCAL CONJUGATE-20 12/10/2020   Pneumococcal Polysaccharide-23 08/12/2006, 05/01/2014   Td 01/11/2001   Tdap 06/08/2017   Health Maintenance Due  Topic Date Due   Zoster Vaccines- Shingrix (1 of 2) Never done   OPHTHALMOLOGY EXAM  04/01/2023      Past Medical History:  Diagnosis Date   CAD (coronary artery disease)    The patient presented in Aug 2008 with acute cornary syndrome. He did have a bypass surgeru. He did have a 3-vessel disease on heart cathiterization and had bypass surgery with LIMA to the  LAD, sequential vein graft to the obtuse marginal - 1 and obtuse marginal -2, and vein graft to the RCA. Adenosine myoview (11/10): EF 64%, normal wall motion, normal perfusion.    Cholelithiasis 12/07/2016   GERD (gastroesophageal reflux disease)    History of echocardiogram    November 2008. EF 65-70%. no regional wall abnormalities. Trivial mitral regurgitation.   HTN (hypertension)    Hyperlipidemia    Mild anemia    Hx of post op. 8/08   Paroxysmal atrial fibrillation (HCC)    only episode noted breifly during hospitalization for PCM placement 11/10.    Sick sinus syndrome (HCC)    with presyncope. medtronic dual chamber PCM placed 11/10   Type II or unspecified type diabetes mellitus without mention of complication, not stated as uncontrolled    Past Surgical History:  Procedure Laterality Date   CORONARY ARTERY BYPASS GRAFT     PACEMAKER INSERTION     MDT implanted 2010   rectal abcess     2005. Dr. Carolynne Edouard with interanal sphincterotomy    reports that he quit smoking about 17 years ago. His smoking use included cigarettes. He started smoking about 37 years ago. He has a 20 pack-year smoking history. He has never used smokeless tobacco. He reports current alcohol use of about 2.0 standard drinks of  alcohol per week. He reports that he does not use drugs. family history is not on file. Allergies  Allergen Reactions   Metformin And Related     rash   Current Outpatient Medications on File Prior to Visit  Medication Sig Dispense Refill   amLODipine (NORVASC) 5 MG tablet Take 1 tablet (5 mg total) by mouth daily. 90 tablet 3   aspirin 81 MG tablet Take 1 tablet (81 mg total) by mouth daily.     atorvastatin (LIPITOR) 40 MG tablet TAKE 1 TABLET BY MOUTH DAILY AT 6 PM 90 tablet 3   Cholecalciferol (THERA-D 2000) 50 MCG (2000 UT) TABS 1 tab by mouth once per day 90 tablet 99   dapagliflozin propanediol (FARXIGA) 10 MG TABS tablet Take 1 tablet (10 mg total) by mouth daily before  breakfast. 90 tablet 3   fenofibrate (TRICOR) 145 MG tablet 1 tab by mouth once daily 90 tablet 3   lisinopril (ZESTRIL) 40 MG tablet Take 1 tablet (40 mg total) by mouth daily. 90 tablet 3   loratadine (CLARITIN) 10 MG tablet Take 1 tablet (10 mg total) by mouth daily. 30 tablet 2   Omega-3 Fatty Acids (OMEGA-3 FISH OIL) 1200 MG CAPS Take 1 capsule by mouth 2 (two) times daily.     omeprazole (PRILOSEC) 20 MG capsule TAKE 1 CAPSULE BY MOUTH TWICE DAILY BEFORE A MEAL 180 capsule 3   pioglitazone (ACTOS) 45 MG tablet Take 1 tablet (45 mg total) by mouth daily. 90 tablet 3   vitamin B-12 (CYANOCOBALAMIN) 50 MCG tablet Take 50 mcg by mouth daily.     ezetimibe (ZETIA) 10 MG tablet Take 1 tablet (10 mg total) by mouth daily. 90 tablet 3   nitroGLYCERIN (NITROSTAT) 0.4 MG SL tablet Place 1 tablet (0.4 mg total) under the tongue every 5 (five) minutes as needed for chest pain. 15 tablet 3   No current facility-administered medications on file prior to visit.        ROS:  All others reviewed and negative.  Objective        PE:  BP (!) 140/80 (BP Location: Right Arm, Patient Position: Sitting, Cuff Size: Normal)   Pulse 80   Temp 98.5 F (36.9 C) (Oral)   Ht 5\' 4"  (1.626 m)   Wt 176 lb (79.8 kg)   SpO2 98%   BMI 30.21 kg/m                 Constitutional: Pt appears in NAD               HENT: Head: NCAT.                Right Ear: External ear normal.                 Left Ear: External ear normal.                Eyes: . Pupils are equal, round, and reactive to light. Conjunctivae and EOM are normal               Nose: without d/c or deformity               Neck: Neck supple. Gross normal ROM               Cardiovascular: Normal rate and regular rhythm.                 Pulmonary/Chest: Effort normal and breath sounds without rales  or wheezing.                Abd:  Soft, NT, ND, + BS, no organomegaly               Neurological: Pt is alert. At baseline orientation, motor grossly intact                Skin: Skin is warm. No rashes, no other new lesions, [place] edema - none               Psychiatric: Pt behavior is normal without agitation   Micro: none  Cardiac tracings I have personally interpreted today:  none  Pertinent Radiological findings (summarize): none   Lab Results  Component Value Date   WBC 6.0 03/30/2023   HGB 12.7 (L) 03/30/2023   HCT 37.5 (L) 03/30/2023   PLT 298.0 03/30/2023   GLUCOSE 141 (H) 03/30/2023   CHOL 152 03/30/2023   TRIG 102.0 03/30/2023   HDL 74.50 03/30/2023   LDLDIRECT 55.0 12/10/2020   LDLCALC 57 03/30/2023   ALT 11 03/30/2023   AST 14 03/30/2023   NA 136 03/30/2023   K 4.4 03/30/2023   CL 99 03/30/2023   CREATININE 1.00 03/30/2023   BUN 16 03/30/2023   CO2 30 03/30/2023   TSH 1.86 03/30/2023   PSA 0.35 03/30/2023   INR 0.94 11/10/2008   HGBA1C 8.1 (H) 03/30/2023   MICROALBUR <0.7 03/30/2023   Assessment/Plan:  [name] [name] is a 68 y.o. Asian [4] male with  has a past medical history of CAD (coronary artery disease), Cholelithiasis (12/07/2016), GERD (gastroesophageal reflux disease), History of echocardiogram, HTN (hypertension), Hyperlipidemia, Mild anemia, Paroxysmal atrial fibrillation (HCC), Sick sinus syndrome (HCC), and Type II or unspecified type diabetes mellitus without mention of complication, not stated as uncontrolled.  Encounter for well adult exam with abnormal findings Age and sex appropriate education and counseling updated with regular exercise and diet Referrals for preventative services - pt states will call soon for follow up eye exam Immunizations addressed - for shingrix at pharmacy Smoking counseling  - none needed Evidence for depression or other mood disorder - none significant Most recent labs reviewed. I have personally reviewed and have noted: 1) the patient's medical and social history 2) The patient's current medications and supplements 3) The patient's height, weight, and BMI have been recorded in the  chart   Vitamin D deficiency Last vitamin D Lab Results  Component Value Date   VD25OH 36.39 03/30/2023   Low, to start oral replacement   Hyperlipidemia Lab Results  Component Value Date   LDLCALC 57 03/30/2023   Stable, pt to continue current statin lipitor 40 every day, tricor 145 qd   Essential hypertension BP Readings from Last 3 Encounters:  03/30/23 (!) 140/80  12/16/22 110/62  10/13/22 128/66   Uncontrolled, has had recent wt gain,, pt to continue medical treatment norvasc 5 every day, lisnopril 40 every day, but increased lopressor 50 bid   Diabetes (HCC) Lab Results  Component Value Date   HGBA1C 8.1 (H) 03/30/2023   Uncontrolled with worsening obesity, pt to continue current medical treatment farxiga 10 every day, actos 45 every day, and start mounjaro 2.5 mg weekly with intent to titrate   Followup: Return in about 6 months (around 09/30/2023).  Oliver Barre, MD 04/02/2023 4:49 PM [facility]

## 2023-03-31 ENCOUNTER — Encounter: Payer: Self-pay | Admitting: Internal Medicine

## 2023-03-31 ENCOUNTER — Other Ambulatory Visit (HOSPITAL_COMMUNITY): Payer: Self-pay

## 2023-03-31 ENCOUNTER — Other Ambulatory Visit: Payer: Self-pay | Admitting: Internal Medicine

## 2023-03-31 ENCOUNTER — Telehealth: Payer: Self-pay | Admitting: Pharmacist

## 2023-03-31 MED ORDER — TIRZEPATIDE 2.5 MG/0.5ML ~~LOC~~ SOAJ
2.5000 mg | SUBCUTANEOUS | 11 refills | Status: DC
Start: 2023-03-31 — End: 2023-09-30

## 2023-03-31 MED ORDER — TIRZEPATIDE 2.5 MG/0.5ML ~~LOC~~ SOAJ
2.5000 mg | SUBCUTANEOUS | 11 refills | Status: DC
Start: 2023-03-31 — End: 2023-03-31

## 2023-03-31 NOTE — Telephone Encounter (Signed)
 Pharmacy Patient Advocate Encounter  Received notification from [employer] that Prior Authorization for [facility] 2.5MG /0.5ML auto-injectors has been APPROVED    PA #/Case ID/Reference #: [id number]

## 2023-04-02 ENCOUNTER — Encounter: Payer: Self-pay | Admitting: Internal Medicine

## 2023-04-02 NOTE — Assessment & Plan Note (Signed)
 BP Readings from Last 3 Encounters:  03/30/23 (!) 140/80  12/16/22 110/62  10/13/22 128/66   Uncontrolled, has had recent wt gain,, pt to continue medical treatment norvasc 5 every day, lisnopril 40 every day, but increased lopressor 50 bid

## 2023-04-02 NOTE — Assessment & Plan Note (Signed)
 Last vitamin D Lab Results  Component Value Date   VD25OH 36.39 03/30/2023   Low, to start oral replacement

## 2023-04-02 NOTE — Assessment & Plan Note (Signed)
 Lab Results  Component Value Date   LDLCALC 57 03/30/2023   Stable, pt to continue current statin lipitor 40 every day, tricor 145 qd

## 2023-04-02 NOTE — Assessment & Plan Note (Signed)
 Lab Results  Component Value Date   HGBA1C 8.1 (H) 03/30/2023   Uncontrolled with worsening obesity, pt to continue current medical treatment farxiga 10 every day, actos 45 every day, and start mounjaro 2.5 mg weekly with intent to titrate

## 2023-04-02 NOTE — Assessment & Plan Note (Signed)
 Age and sex appropriate education and counseling updated with regular exercise and diet Referrals for preventative services - pt states will call soon for follow up eye exam Immunizations addressed - for shingrix at pharmacy Smoking counseling  - none needed Evidence for depression or other mood disorder - none significant Most recent labs reviewed. I have personally reviewed and have noted: 1) the patient's medical and social history 2) The patient's current medications and supplements 3) The patient's height, weight, and BMI have been recorded in the chart

## 2023-04-15 ENCOUNTER — Other Ambulatory Visit: Payer: Self-pay | Admitting: Internal Medicine

## 2023-04-15 ENCOUNTER — Other Ambulatory Visit: Payer: Self-pay

## 2023-04-28 NOTE — Progress Notes (Signed)
  Electrophysiology Office Note:   ID:  [name], [name] [birth year], MRN [id number]  Primary Cardiologist: Jerryl Morin, DO Electrophysiologist: Boyce Byes, MD      History of Present Illness:   [name] [name] is a 67 y.o. male with h/o CAD (CABG 2008), HTN, Afib, SSSx s/p PPM, HLD, PVD (following with Dr. Katheryne Pane, medical management at this time), HTN, HLD, DM, AFib.  seen today for routine electrophysiology followup.   Since last being seen in our clinic the patient reports doing well. Overall, he denies chest pain, palpitations, dyspnea, PND, orthopnea, nausea, vomiting, dizziness, syncope, edema, weight gain, or early satiety.   Review of systems complete and found to be negative unless listed in HPI.   EP Information / Studies Reviewed:    EKG is ordered today. Personal review as below.  EKG Interpretation Date/Time:  Friday April 29 2023 09:54:14 EDT Ventricular Rate:  73 PR Interval:  226 QRS Duration:  102 QT Interval:  396 QTC Calculation: 436 R Axis:   60  Text Interpretation: Atrial-paced rhythm with prolonged AV conduction Nonspecific T wave abnormality When compared with ECG of 27-Aug-2022 13:33, No significant change was found Confirmed by Pilar Bridge [phone]) on 04/29/2023 10:00:47 AM    PPM Interrogation-  reviewed in detail today,  See PACEART report.  Arrhythmia/Device History MDT dual chamber PPM implanted 11/11/2008   Physical Exam:   VS:  BP 124/70   Pulse 63   Ht 5\' 4"  (1.626 m)   Wt 176 lb 3.2 oz (79.9 kg)   SpO2 96%   BMI 30.24 kg/m    Wt Readings from Last 3 Encounters:  04/29/23 176 lb 3.2 oz (79.9 kg)  03/30/23 176 lb (79.8 kg)  12/16/22 174 lb (78.9 kg)     GEN: No acute distress  NECK: No JVD; No carotid bruits CARDIAC: Regular rate and rhythm, no murmurs, rubs, gallops RESPIRATORY:  Clear to auscultation without rales, wheezing or rhonchi  ABDOMEN: Soft, non-tender, non-distended EXTREMITIES:  No edema; No deformity   ASSESSMENT AND  PLAN:    SND s/p Medtronic PPM  Normal PPM function See Pace Art report No changes today  CAD Denies s/s ischemia  HTN Stable on current regimen   Subclinical AF Follow on device.   HLD Continue statin     Disposition:   Follow up with EP APP in 12 months, as long as he keeps up with remotes. Otherwise will need to see in 6 months.  Signed, Tylene Galla, PA-C

## 2023-04-29 ENCOUNTER — Ambulatory Visit: Payer: Medicare (Managed Care) | Attending: Cardiology | Admitting: Student

## 2023-04-29 ENCOUNTER — Encounter: Payer: Self-pay | Admitting: Student

## 2023-04-29 VITALS — BP 124/70 | HR 63 | Ht 64.0 in | Wt 176.2 lb

## 2023-04-29 DIAGNOSIS — Z950 Presence of cardiac pacemaker: Secondary | ICD-10-CM

## 2023-04-29 DIAGNOSIS — I495 Sick sinus syndrome: Secondary | ICD-10-CM | POA: Diagnosis not present

## 2023-04-29 DIAGNOSIS — I480 Paroxysmal atrial fibrillation: Secondary | ICD-10-CM

## 2023-04-29 DIAGNOSIS — I10 Essential (primary) hypertension: Secondary | ICD-10-CM

## 2023-04-29 DIAGNOSIS — E781 Pure hyperglyceridemia: Secondary | ICD-10-CM | POA: Diagnosis not present

## 2023-04-29 LAB — CUP PACEART INCLINIC DEVICE CHECK
Battery Impedance: 3953 Ohm
Battery Remaining Longevity: 14 mo
Battery Voltage: 2.7 V
Brady Statistic AP VP Percent: 1 %
Brady Statistic AP VS Percent: 64 %
Brady Statistic AS VP Percent: 0 %
Brady Statistic AS VS Percent: 35 %
Date Time Interrogation Session: 20250418101715
Implantable Lead Connection Status: 753985
Implantable Lead Connection Status: 753985
Implantable Lead Implant Date: 20101101
Implantable Lead Implant Date: 20101101
Implantable Lead Location: 753859
Implantable Lead Location: 753860
Implantable Lead Model: 5076
Implantable Lead Model: 5076
Implantable Pulse Generator Implant Date: 20101101
Lead Channel Impedance Value: 515 Ohm
Lead Channel Impedance Value: 531 Ohm
Lead Channel Pacing Threshold Amplitude: 0.625 V
Lead Channel Pacing Threshold Amplitude: 0.875 V
Lead Channel Pacing Threshold Amplitude: 1 V
Lead Channel Pacing Threshold Amplitude: 1 V
Lead Channel Pacing Threshold Pulse Width: 0.4 ms
Lead Channel Pacing Threshold Pulse Width: 0.4 ms
Lead Channel Pacing Threshold Pulse Width: 0.4 ms
Lead Channel Pacing Threshold Pulse Width: 0.4 ms
Lead Channel Sensing Intrinsic Amplitude: 1.4 mV
Lead Channel Sensing Intrinsic Amplitude: 15.67 mV
Lead Channel Setting Pacing Amplitude: 2 V
Lead Channel Setting Pacing Amplitude: 2.5 V
Lead Channel Setting Pacing Pulse Width: 0.4 ms
Lead Channel Setting Sensing Sensitivity: 5.6 mV
Zone Setting Status: 755011
Zone Setting Status: 755011

## 2023-04-29 NOTE — Patient Instructions (Signed)
 Medication Instructions:  Your physician recommends that you continue on your current medications as directed. Please refer to the Current Medication list given to you today.  *If you need a refill on your cardiac medications before your next appointment, please call your pharmacy*  Lab Work: None ordered If you have labs (blood work) drawn today and your tests are completely normal, you will receive your results only by: MyChart Message (if you have MyChart) OR A paper copy in the mail If you have any lab test that is abnormal or we need to change your treatment, we will call you to review the results.  Follow-Up: At [facility], you and your health needs are our priority.  As part of our continuing mission to provide you with exceptional heart care, our providers are all part of one team.  This team includes your primary Cardiologist (physician) and Advanced Practice Providers or APPs (Physician Assistants and Nurse Practitioners) who all work together to provide you with the care you need, when you need it.  Your next appointment:   1 year(s)  Provider:   Casimiro Needle "Mardelle Matte" Lanna Poche, PA-C       1st Floor: - Lobby - Registration  - Pharmacy  - Lab - Cafe  2nd Floor: - PV Lab - Diagnostic Testing (echo, CT, nuclear med)  3rd Floor: - Vacant  4th Floor: - TCTS (cardiothoracic surgery) - AFib Clinic - Structural Heart Clinic - Vascular Surgery  - Vascular Ultrasound  5th Floor: - HeartCare Cardiology (general and EP) - Clinical Pharmacy for coumadin, hypertension, lipid, weight-loss medications, and med management appointments    Valet parking services will be available as well.

## 2023-05-05 ENCOUNTER — Other Ambulatory Visit: Payer: Self-pay | Admitting: Internal Medicine

## 2023-05-07 ENCOUNTER — Encounter: Payer: Self-pay | Admitting: Cardiology

## 2023-05-12 ENCOUNTER — Ambulatory Visit (INDEPENDENT_AMBULATORY_CARE_PROVIDER_SITE_OTHER): Payer: Medicare (Managed Care)

## 2023-05-12 DIAGNOSIS — I495 Sick sinus syndrome: Secondary | ICD-10-CM | POA: Diagnosis not present

## 2023-05-13 ENCOUNTER — Encounter: Payer: Self-pay | Admitting: Cardiology

## 2023-05-13 LAB — CUP PACEART REMOTE DEVICE CHECK
Battery Impedance: 3989 Ohm
Battery Remaining Longevity: 14 mo
Battery Voltage: 2.69 V
Brady Statistic AP VP Percent: 3 %
Brady Statistic AP VS Percent: 81 %
Brady Statistic AS VP Percent: 1 %
Brady Statistic AS VS Percent: 15 %
Date Time Interrogation Session: 20250501114622
Implantable Lead Connection Status: 753985
Implantable Lead Connection Status: 753985
Implantable Lead Implant Date: 20101101
Implantable Lead Implant Date: 20101101
Implantable Lead Location: 753859
Implantable Lead Location: 753860
Implantable Lead Model: 5076
Implantable Lead Model: 5076
Implantable Pulse Generator Implant Date: 20101101
Lead Channel Impedance Value: 548 Ohm
Lead Channel Impedance Value: 551 Ohm
Lead Channel Pacing Threshold Amplitude: 0.625 V
Lead Channel Pacing Threshold Amplitude: 0.875 V
Lead Channel Pacing Threshold Pulse Width: 0.4 ms
Lead Channel Pacing Threshold Pulse Width: 0.4 ms
Lead Channel Setting Pacing Amplitude: 2 V
Lead Channel Setting Pacing Amplitude: 2.5 V
Lead Channel Setting Pacing Pulse Width: 0.4 ms
Lead Channel Setting Sensing Sensitivity: 4 mV
Zone Setting Status: 755011
Zone Setting Status: 755011

## 2023-05-20 ENCOUNTER — Other Ambulatory Visit: Payer: Self-pay | Admitting: Internal Medicine

## 2023-05-20 ENCOUNTER — Other Ambulatory Visit: Payer: Self-pay

## 2023-05-25 LAB — HM DIABETES EYE EXAM

## 2023-06-09 ENCOUNTER — Other Ambulatory Visit: Payer: Self-pay | Admitting: Internal Medicine

## 2023-06-09 ENCOUNTER — Other Ambulatory Visit: Payer: Self-pay

## 2023-06-16 ENCOUNTER — Other Ambulatory Visit: Payer: Self-pay

## 2023-06-16 ENCOUNTER — Other Ambulatory Visit: Payer: Self-pay | Admitting: Internal Medicine

## 2023-06-23 NOTE — Progress Notes (Signed)
 Remote pacemaker transmission.

## 2023-06-23 NOTE — Addendum Note (Signed)
 Addended by: Lott Rouleau A on: 06/23/2023 09:53 AM   Modules accepted: Orders

## 2023-06-25 ENCOUNTER — Other Ambulatory Visit: Payer: Self-pay | Admitting: Internal Medicine

## 2023-07-07 ENCOUNTER — Telehealth: Payer: Self-pay

## 2023-07-07 ENCOUNTER — Other Ambulatory Visit: Payer: Self-pay

## 2023-07-07 MED ORDER — FENOFIBRATE 145 MG PO TABS
145.0000 mg | ORAL_TABLET | Freq: Every day | ORAL | 3 refills | Status: AC
Start: 2023-07-07 — End: ?

## 2023-07-07 NOTE — Telephone Encounter (Signed)
 Copied from CRM [id number]. Topic: Clinical - Prescription Issue >> Jul 07, 2023  3:14 PM Berneda FALCON wrote: Reason for CRM: fenofibrate  (TRICOR ) 145 MG tablet [[id number]]  [id number] - [place], [place] - [address] [place] [address] Phone: [phone] Fax: [phone] Hours: Not open 24 hours    Pt states the pharmacy told him they did not get the prescription, although I can see it was sent on 6/17. Please assist.  Patient callback is [phone]  Thank you so much!

## 2023-07-07 NOTE — Telephone Encounter (Signed)
Prescription has been resent

## 2023-08-11 ENCOUNTER — Ambulatory Visit: Payer: Medicare (Managed Care)

## 2023-08-11 DIAGNOSIS — I495 Sick sinus syndrome: Secondary | ICD-10-CM | POA: Diagnosis not present

## 2023-08-12 LAB — CUP PACEART REMOTE DEVICE CHECK
Battery Impedance: 4795 Ohm
Battery Remaining Longevity: 8 mo
Battery Voltage: 2.68 V
Brady Statistic AP VP Percent: 2 %
Brady Statistic AP VS Percent: 66 %
Brady Statistic AS VP Percent: 1 %
Brady Statistic AS VS Percent: 31 %
Date Time Interrogation Session: 20250801120834
Implantable Lead Connection Status: 753985
Implantable Lead Connection Status: 753985
Implantable Lead Implant Date: 20101101
Implantable Lead Implant Date: 20101101
Implantable Lead Location: 753859
Implantable Lead Location: 753860
Implantable Lead Model: 5076
Implantable Lead Model: 5076
Implantable Pulse Generator Implant Date: 20101101
Lead Channel Impedance Value: 496 Ohm
Lead Channel Impedance Value: 526 Ohm
Lead Channel Pacing Threshold Amplitude: 0.625 V
Lead Channel Pacing Threshold Amplitude: 1.125 V
Lead Channel Pacing Threshold Pulse Width: 0.4 ms
Lead Channel Pacing Threshold Pulse Width: 0.4 ms
Lead Channel Setting Pacing Amplitude: 2 V
Lead Channel Setting Pacing Amplitude: 2.5 V
Lead Channel Setting Pacing Pulse Width: 0.4 ms
Lead Channel Setting Sensing Sensitivity: 5.6 mV
Zone Setting Status: 755011
Zone Setting Status: 755011

## 2023-08-15 ENCOUNTER — Ambulatory Visit: Payer: Self-pay | Admitting: Cardiology

## 2023-09-15 ENCOUNTER — Ambulatory Visit: Payer: Medicare (Managed Care)

## 2023-09-15 VITALS — Ht 63.5 in | Wt 176.0 lb

## 2023-09-15 DIAGNOSIS — Z0000 Encounter for general adult medical examination without abnormal findings: Secondary | ICD-10-CM

## 2023-09-15 NOTE — Progress Notes (Signed)
 Subjective:   [name] [name] is a 68 y.o. who presents for a Medicare Wellness preventive visit.  As a reminder, Annual Wellness Visits don't include a physical exam, and some assessments may be limited, especially if this visit is performed virtually. We may recommend an in-person follow-up visit with your provider if needed.  Visit Complete: Virtual I connected with  [name] on 09/15/23 by a audio enabled telemedicine application and verified that I am speaking with the correct person using two identifiers.  Patient Location: Home  Provider Location: Office/Clinic  I discussed the limitations of evaluation and management by telemedicine. The patient expressed understanding and agreed to proceed.  Vital Signs: Because this visit was a virtual/telehealth visit, some criteria may be missing or patient reported. Any vitals not documented were not able to be obtained and vitals that have been documented are patient reported.  VideoDeclined- This patient declined Librarian, academic. Therefore the visit was completed with audio only.  Persons Participating in Visit: Patient.  AWV Questionnaire: No: Patient Medicare AWV questionnaire was not completed prior to this visit.  Cardiac Risk Factors include: advanced age (>22men, >10 women);diabetes mellitus;dyslipidemia;hypertension;male gender;obesity (BMI >30kg/m2);Other (see comment), Risk factor comments: CAD, A. Fib     Objective:    Today's Vitals   09/15/23 1135  Weight: 176 lb (79.8 kg)  Height: 5' 3.5 (1.613 m)   Body mass index is 30.69 kg/m.     09/15/2023   11:35 AM 09/09/2022   11:18 AM  Advanced Directives  Does Patient Have a Medical Advance Directive? No Yes  Type of Special educational needs teacher of [place];Living will  Copy of Healthcare Power of Attorney in Chart?  No - copy requested  Would patient like information on creating a medical advance directive? No - Patient declined      Current Medications (verified) Outpatient Encounter Medications as of 09/15/2023  Medication Sig   amLODipine  (NORVASC ) 5 MG tablet TAKE 1 TABLET(5 MG) BY MOUTH DAILY   aspirin  81 MG tablet Take 1 tablet (81 mg total) by mouth daily.   atorvastatin  (LIPITOR) 40 MG tablet TAKE 1 TABLET BY MOUTH DAILY AT 6 PM   Cholecalciferol (THERA-D 2000) 50 MCG (2000 UT) TABS 1 tab by mouth once per day   dapagliflozin  propanediol (FARXIGA ) 10 MG TABS tablet Take 1 tablet (10 mg total) by mouth daily before breakfast.   ezetimibe  (ZETIA ) 10 MG tablet TAKE 1 TABLET(10 MG) BY MOUTH DAILY   fenofibrate  (TRICOR ) 145 MG tablet Take 1 tablet (145 mg total) by mouth daily.   lisinopril  (ZESTRIL ) 40 MG tablet TAKE 1 TABLET(40 MG) BY MOUTH DAILY   loratadine  (CLARITIN ) 10 MG tablet Take 1 tablet (10 mg total) by mouth daily.   metoprolol  tartrate (LOPRESSOR ) 50 MG tablet Take 1 tablet (50 mg total) by mouth 2 (two) times daily.   nitroGLYCERIN  (NITROSTAT ) 0.4 MG SL tablet Place 1 tablet (0.4 mg total) under the tongue every 5 (five) minutes as needed for chest pain.   Omega-3 Fatty Acids (OMEGA-3 FISH OIL) 1200 MG CAPS Take 1 capsule by mouth 2 (two) times daily.   omeprazole  (PRILOSEC) 20 MG capsule TAKE 1 CAPSULE BY MOUTH TWICE DAILY BEFORE A MEAL   pioglitazone  (ACTOS ) 45 MG tablet Take 1 tablet (45 mg total) by mouth daily.   tirzepatide  (MOUNJARO ) 2.5 MG/0.5ML Pen Inject 2.5 mg into the skin once a week. E11.9   vitamin B-12 (CYANOCOBALAMIN ) 50 MCG tablet Take 50 mcg  by mouth daily.   No facility-administered encounter medications on file as of 09/15/2023.    Allergies (verified) Metformin  and related   History: Past Medical History:  Diagnosis Date   CAD (coronary artery disease)    The patient presented in Aug 2008 with acute cornary syndrome. He did have a bypass surgeru. He did have a 3-vessel disease on heart cathiterization and had bypass surgery with LIMA to the LAD, sequential vein graft to the  obtuse marginal - 1 and obtuse marginal -2, and vein graft to the RCA. Adenosine myoview (11/10): EF 64%, normal wall motion, normal perfusion.    Cholelithiasis 12/07/2016   GERD (gastroesophageal reflux disease)    History of echocardiogram    November 2008. EF 65-70%. no regional wall abnormalities. Trivial mitral regurgitation.   HTN (hypertension)    Hyperlipidemia    Mild anemia    Hx of post op. 8/08   Paroxysmal atrial fibrillation (HCC)    only episode noted breifly during hospitalization for PCM placement 11/10.    Sick sinus syndrome (HCC)    with presyncope. medtronic dual chamber PCM placed 11/10   Type II or unspecified type diabetes mellitus without mention of complication, not stated as uncontrolled    Past Surgical History:  Procedure Laterality Date   CORONARY ARTERY BYPASS GRAFT     PACEMAKER INSERTION     MDT implanted 2010   rectal abcess     2005. Dr. Curvin with interanal sphincterotomy   Family History  Problem Relation Age of Onset   Colon cancer Neg Hx    Colon polyps Neg Hx    Esophageal cancer Neg Hx    Rectal cancer Neg Hx    Stomach cancer Neg Hx    Social History   Socioeconomic History   Marital status: Married    Spouse name: Not on file   Number of children: 3   Years of education: Not on file   Highest education level: Not on file  Occupational History   Occupation: Retired  Tobacco Use   Smoking status: Former    Current packs/day: 0.00    Average packs/day: 1 pack/day for 20.0 years (20.0 ttl pk-yrs)    Types: Cigarettes    Start date: 01/11/1986    Quit date: 01/11/2006    Years since quitting: 17.6   Smokeless tobacco: Never  Vaping Use   Vaping status: Never Used  Substance and Sexual Activity   Alcohol use: Yes    Alcohol/week: 2.0 standard drinks of alcohol    Types: 2 Cans of beer per week    Comment: social on weekends   Drug use: No   Sexual activity: Yes  Other Topics Concern   Not on file  Social History Narrative    Married, 3 children. To [place]  from [place] 1979. Work- sign spray pain- Copywriter, advertising   Social Drivers of Corporate investment banker Strain: Low Risk  (09/15/2023)   Overall Financial Resource Strain (CARDIA)    Difficulty of Paying Living Expenses: Not hard at all  Food Insecurity: No Food Insecurity (09/15/2023)   Hunger Vital Sign    Worried About Running Out of Food in the Last Year: Never true    Ran Out of Food in the Last Year: Never true  Transportation Needs: No Transportation Needs (09/15/2023)   PRAPARE - Administrator, Civil Service (Medical): No    Lack of Transportation (Non-Medical): No  Physical Activity: Insufficiently Active (09/15/2023)   Exercise  Vital Sign    Days of Exercise per Week: 2 days    Minutes of Exercise per Session: 40 min  Stress: No Stress Concern Present (09/15/2023)   [employer] of Occupational Health - Occupational Stress Questionnaire    Feeling of Stress: Not at all  Social Connections: Moderately Integrated (09/15/2023)   Social Connection and Isolation Panel    Frequency of Communication with Friends and Family: Three times a week    Frequency of Social Gatherings with Friends and Family: Never    Attends Religious Services: More than 4 times per year    Active Member of [employer] or Organizations: No    Attends Engineer, structural: Never    Marital Status: Married    Tobacco Counseling Counseling given: No    Clinical Intake:  Pre-visit preparation completed: Yes  Pain : No/denies pain     BMI - recorded: 30.69 Nutritional Status: BMI > 30  Obese Nutritional Risks: None Diabetes: Yes CBG done?: No Did pt. bring in CBG monitor from home?: No  Lab Results  Component Value Date   HGBA1C 8.1 (H) 03/30/2023   HGBA1C 9.0 (H) 10/01/2022   HGBA1C 10.1 (H) 04/01/2022     How often do you need to have someone help you when you read instructions, pamphlets, or other written materials from your doctor or  pharmacy?: 1 - Never  Interpreter Needed?: No  Information entered by :: Verdie Saba, CMA   Activities of Daily Living     09/15/2023   11:39 AM  In your present state of health, do you have any difficulty performing the following activities:  Hearing? 0  Vision? 0  Difficulty concentrating or making decisions? 0  Walking or climbing stairs? 0  Dressing or bathing? 0  Doing errands, shopping? 0  Preparing Food and eating ? N  Using the Toilet? N  In the past six months, have you accidently leaked urine? N  Do you have problems with loss of bowel control? N  Managing your Medications? N  Managing your Finances? N  Housekeeping or managing your Housekeeping? N    Patient Care Team: Norleen Lynwood ORN, MD as PCP - General Tobb, Kardie, DO as PCP - Cardiology (Cardiology) Cindie Ole DASEN, MD as PCP - Electrophysiology (Cardiology) Teressa Toribio SQUIBB, MD (Inactive) (Gastroenterology) Kelsie Lynwood, MD (Inactive) (Cardiology) Rolan Ezra RAMAN, MD (Cardiology) [facility] (Ophthalmology)  I have updated your Care Teams any recent Medical Services you may have received from other providers in the past year.     Assessment:   This is a routine wellness examination for [place].  Hearing/Vision screen Hearing Screening - Comments:: Denies hearing difficulties   Vision Screening - Comments:: Wears eyeglasses for reading - up to date with routine eye exams with [facility]   Goals Addressed               This Visit's Progress     Patient Stated (pt-stated)        Patient stated he plans to continue exercising - riding bike       Depression Screen     09/15/2023   11:40 AM 03/30/2023   10:45 AM 09/09/2022   11:25 AM 04/01/2022   10:45 AM 10/01/2021   10:55 AM 03/26/2021   10:31 AM 12/10/2020   11:49 AM  PHQ 2/9 Scores  PHQ - 2 Score 0 0 0 0  0 0  PHQ- 9 Score 0  0 0  Exception Documentation     Patient refusal      Fall Risk     09/15/2023    11:40 AM 03/30/2023   10:50 AM 09/09/2022   11:19 AM 04/01/2022   10:46 AM 10/01/2021   10:55 AM  Fall Risk   Falls in the past year? 0 0 0 0 0  Number falls in past yr: 0 0 0 0   Injury with Fall? 0 0 0 0   Risk for fall due to : No Fall Risks No Fall Risks No Fall Risks No Fall Risks No Fall Risks  Follow up Falls evaluation completed;Falls prevention discussed Falls evaluation completed Falls prevention discussed;Falls evaluation completed Falls evaluation completed;Education provided Falls evaluation completed      Data saved with a previous flowsheet row definition    MEDICARE RISK AT HOME:  Medicare Risk at Home Any stairs in or around the home?: Yes If so, are there any without handrails?: No Home free of loose throw rugs in walkways, pet beds, electrical cords, etc?: Yes Adequate lighting in your home to reduce risk of falls?: Yes Life alert?: No Use of a cane, walker or w/c?: No Grab bars in the bathroom?: No Shower chair or bench in shower?: No Elevated toilet seat or a handicapped toilet?: No  TIMED UP AND GO:  Was the test performed?  No  Cognitive Function: 6CIT completed        09/15/2023   11:46 AM 09/09/2022   11:20 AM  6CIT Screen  What Year? 0 points 0 points  What month? 0 points 0 points  What time? 0 points 0 points  Count back from 20 0 points 0 points  Months in reverse 0 points 4 points  Repeat phrase 2 points 0 points  Total Score 2 points 4 points    Immunizations Immunization History  Administered Date(s) Administered   INFLUENZA, HIGH DOSE SEASONAL PF 10/08/2020   Influenza Inj Mdck Quad Pf 09/30/2017   Influenza Split 10/27/2020   Influenza Whole 11/11/2008   Influenza,inj,Quad PF,6+ Mos 10/22/2016, 10/17/2018   Influenza-Unspecified 09/11/2013, 10/11/2016, 10/15/2019, 10/20/2021, 09/13/2022   PFIZER(Purple Top)SARS-COV-2 Vaccination 03/09/2019, 04/04/2019, 11/16/2019   PNEUMOCOCCAL CONJUGATE-20 12/10/2020   Pneumococcal  Polysaccharide-23 08/12/2006, 05/01/2014   Td 01/11/2001   Tdap 06/08/2017    Screening Tests Health Maintenance  Topic Date Due   Zoster Vaccines- Shingrix (1 of 2) Never done   INFLUENZA VACCINE  08/12/2023   HEMOGLOBIN A1C  09/30/2023   Diabetic kidney evaluation - eGFR measurement  03/29/2024   Diabetic kidney evaluation - Urine ACR  03/29/2024   FOOT EXAM  03/29/2024   OPHTHALMOLOGY EXAM  05/24/2024   Medicare Annual Wellness (AWV)  09/14/2024   DTaP/Tdap/Td (3 - Td or Tdap) 06/09/2027   Colonoscopy  12/15/2032   Pneumococcal Vaccine: 50+ Years  Completed   Hepatitis C Screening  Completed   HPV VACCINES  Aged Out   Meningococcal B Vaccine  Aged Out   COVID-19 Vaccine  Discontinued    Health Maintenance  Health Maintenance Due  Topic Date Due   Zoster Vaccines- Shingrix (1 of 2) Never done   INFLUENZA VACCINE  08/12/2023   Health Maintenance Items Addressed:  09/15/2023  Additional Screening:  Vision Screening: Recommended annual ophthalmology exams for early detection of glaucoma and other disorders of the eye. Would you like a referral to an eye doctor? No    Dental Screening: Recommended annual dental exams for proper oral hygiene  Community Resource Referral /  Chronic Care Management: CRR required this visit?  No   CCM required this visit?  No   Plan:    I have personally reviewed and noted the following in the patient's chart:   Medical and social history Use of alcohol, tobacco or illicit drugs  Current medications and supplements including opioid prescriptions. Patient is not currently taking opioid prescriptions. Functional ability and status Nutritional status Physical activity Advanced directives List of other physicians Hospitalizations, surgeries, and ER visits in previous 12 months Vitals Screenings to include cognitive, depression, and falls Referrals and appointments  In addition, I have reviewed and discussed with patient certain  preventive protocols, quality metrics, and best practice recommendations. A written personalized care plan for preventive services as well as general preventive health recommendations were provided to patient.   Verdie CHRISTELLA Saba, CMA   09/15/2023   After Visit Summary: (MyChart) Due to this being a telephonic visit, the after visit summary with patients personalized plan was offered to patient via MyChart   Notes: Nothing significant to report at this time.

## 2023-09-15 NOTE — Patient Instructions (Signed)
 [name] , Thank you for taking time out of your busy schedule to complete your Annual Wellness Visit with me. I enjoyed our conversation and look forward to speaking with you again next year. I, as well as your care team,  appreciate your ongoing commitment to your health goals. Please review the following plan we discussed and let me know if I can assist you in the future. Your Game plan/ To Do List    Referrals: If you haven't heard from the office you've been referred to, please reach out to them at the phone provided.   Follow up Visits: We will see or speak with you next year for your Next Medicare AWV with our clinical staff Have you seen your provider in the last 6 months (3 months if uncontrolled diabetes)? Yes  Clinician Recommendations:  Aim for 30 minutes of exercise or brisk walking, 6-8 glasses of water, and 5 servings of fruits and vegetables each day. Educated and advised on getting the Shingles and Influenza vaccines in 2025.      This is a list of the screenings recommended for you:  Health Maintenance  Topic Date Due   Zoster (Shingles) Vaccine (1 of 2) Never done   Flu Shot  08/12/2023   Hemoglobin A1C  09/30/2023   Yearly kidney function blood test for diabetes  03/29/2024   Yearly kidney health urinalysis for diabetes  03/29/2024   Complete foot exam   03/29/2024   Eye exam for diabetics  05/24/2024   Medicare Annual Wellness Visit  09/14/2024   DTaP/Tdap/Td vaccine (3 - Td or Tdap) 06/09/2027   Colon Cancer Screening  12/15/2032   Pneumococcal Vaccine for age over 62  Completed   Hepatitis C Screening  Completed   HPV Vaccine  Aged Out   Meningitis B Vaccine  Aged Out   COVID-19 Vaccine  Discontinued    Advanced directives: (Declined) Advance directive discussed with you today. Even though you declined this today, please call our office should you change your mind, and we can give you the proper paperwork for you to fill out. Advance Care Planning is important  because it:  [x]  Makes sure you receive the medical care that is consistent with your values, goals, and preferences  [x]  It provides guidance to your family and loved ones and reduces their decisional burden about whether or not they are making the right decisions based on your wishes.  Follow the link provided in your after visit summary or read over the paperwork we have mailed to you to help you started getting your Advance Directives in place. If you need assistance in completing these, please reach out to us  so that we can help you!

## 2023-09-23 ENCOUNTER — Encounter: Payer: Self-pay | Admitting: Cardiology

## 2023-09-30 ENCOUNTER — Ambulatory Visit: Payer: Self-pay | Admitting: Internal Medicine

## 2023-09-30 ENCOUNTER — Ambulatory Visit: Payer: Medicare (Managed Care) | Admitting: Internal Medicine

## 2023-09-30 ENCOUNTER — Encounter: Payer: Self-pay | Admitting: Internal Medicine

## 2023-09-30 VITALS — BP 132/78 | HR 68 | Temp 98.3°F | Ht 63.5 in | Wt 173.8 lb

## 2023-09-30 DIAGNOSIS — E0800 Diabetes mellitus due to underlying condition with hyperosmolarity without nonketotic hyperglycemic-hyperosmolar coma (NKHHC): Secondary | ICD-10-CM | POA: Diagnosis not present

## 2023-09-30 DIAGNOSIS — E7800 Pure hypercholesterolemia, unspecified: Secondary | ICD-10-CM

## 2023-09-30 DIAGNOSIS — I10 Essential (primary) hypertension: Secondary | ICD-10-CM

## 2023-09-30 DIAGNOSIS — E559 Vitamin D deficiency, unspecified: Secondary | ICD-10-CM

## 2023-09-30 LAB — HEPATIC FUNCTION PANEL
ALT: 12 U/L (ref 0–53)
AST: 17 U/L (ref 0–37)
Albumin: 4.5 g/dL (ref 3.5–5.2)
Alkaline Phosphatase: 31 U/L — ABNORMAL LOW (ref 39–117)
Bilirubin, Direct: 0.1 mg/dL (ref 0.0–0.3)
Total Bilirubin: 0.4 mg/dL (ref 0.2–1.2)
Total Protein: 7.8 g/dL (ref 6.0–8.3)

## 2023-09-30 LAB — VITAMIN D 25 HYDROXY (VIT D DEFICIENCY, FRACTURES): VITD: 44.98 ng/mL (ref 30.00–100.00)

## 2023-09-30 LAB — LIPID PANEL
Cholesterol: 118 mg/dL (ref 0–200)
HDL: 60.9 mg/dL (ref >39.00)
LDL Cholesterol: 39 mg/dL (ref 0–99)
NonHDL: 57.49
Total CHOL/HDL Ratio: 2
Triglycerides: 93 mg/dL (ref 0.0–149.0)
VLDL: 18.6 mg/dL (ref 0.0–40.0)

## 2023-09-30 LAB — BASIC METABOLIC PANEL WITH GFR
BUN: 13 mg/dL (ref 6–23)
CO2: 29 meq/L (ref 19–32)
Calcium: 10.2 mg/dL (ref 8.4–10.5)
Chloride: 101 meq/L (ref 96–112)
Creatinine, Ser: 1.11 mg/dL (ref 0.40–1.50)
GFR: 67.7 mL/min (ref >60.00)
Glucose, Bld: 105 mg/dL — ABNORMAL HIGH (ref 70–99)
Potassium: 3.9 meq/L (ref 3.5–5.1)
Sodium: 136 meq/L (ref 135–145)

## 2023-09-30 LAB — HEMOGLOBIN A1C: Hgb A1c MFr Bld: 6.8 % — ABNORMAL HIGH (ref 4.6–6.5)

## 2023-09-30 MED ORDER — PIOGLITAZONE HCL 45 MG PO TABS: 45.0000 mg | ORAL_TABLET | Freq: Every day | ORAL | 3 refills | Status: AC

## 2023-09-30 MED ORDER — TIRZEPATIDE 5 MG/0.5ML ~~LOC~~ SOAJ: 5.0000 mg | SUBCUTANEOUS | 3 refills | Status: AC

## 2023-09-30 MED ORDER — DAPAGLIFLOZIN PROPANEDIOL 10 MG PO TABS: 10.0000 mg | ORAL_TABLET | Freq: Every day | ORAL | 3 refills | Status: DC

## 2023-09-30 NOTE — Assessment & Plan Note (Signed)
 Last vitamin D Lab Results  Component Value Date   VD25OH 36.39 03/30/2023   Low, to start oral replacement

## 2023-09-30 NOTE — Patient Instructions (Signed)
 Ok to increase the Mounjaro  to 5 mg weekly  Please continue all other medications as before, and refills have been done if requested.  Please have the pharmacy call with any other refills you may need.  Please continue your efforts at being more active, low cholesterol diet, and weight control.  Please keep your appointments with your specialists as you may have planned  Please go to the LAB at the blood drawing area for the tests to be done  You will be contacted by phone if any changes need to be made immediately.  Otherwise, you will receive a letter about your results with an explanation, but please check with MyChart first.  Please make an Appointment to return in 6 months, or sooner if needed

## 2023-09-30 NOTE — Assessment & Plan Note (Signed)
 Lab Results  Component Value Date   HGBA1C 8.1 (H) 03/30/2023   Uncontrolled with hyperglycemia, pt to continue current medical treatment farxiga  10 mg every day, but increased mounjaro  to 5 mg weekly, also continue actos  30 mg qd

## 2023-09-30 NOTE — Assessment & Plan Note (Signed)
 BP Readings from Last 3 Encounters:  09/30/23 132/78  04/29/23 124/70  03/30/23 (!) 140/80   uncontrolled, pt to continue medical treatment norvasc  5 qd

## 2023-09-30 NOTE — Progress Notes (Signed)
 Patient ID: [name] [name], male   DOB: May 17, 1955, 68 y.o.   MRN: [id number]        Chief Complaint: follow up dm, htn, hld, low vit d       HPI:  [name] [name] is a 68 y.o. male here overall doing ok.  Pt denies chest pain, increased sob or doe, wheezing, orthopnea, PND, increased Shurley swelling, palpitations, dizziness or syncope. Unable to lose wt with 2.5 mg mounjaro  so far.  Tolerating this well.  Has hyperglycemia   Pt denies polydipsia, polyuria, or new focal neuro s/s.    Pt denies fever, wt loss, night sweats, loss of appetite, or other constitutional symptoms        Wt Readings from Last 3 Encounters:  09/30/23 173 lb 12.8 oz (78.8 kg)  09/15/23 176 lb (79.8 kg)  04/29/23 176 lb 3.2 oz (79.9 kg)   BP Readings from Last 3 Encounters:  09/30/23 132/78  04/29/23 124/70  03/30/23 (!) 140/80         Past Medical History:  Diagnosis Date   CAD (coronary artery disease)    The patient presented in Aug 2008 with acute cornary syndrome. He did have a bypass surgeru. He did have a 3-vessel disease on heart cathiterization and had bypass surgery with LIMA to the LAD, sequential vein graft to the obtuse marginal - 1 and obtuse marginal -2, and vein graft to the RCA. Adenosine myoview (11/10): EF 64%, normal wall motion, normal perfusion.    Cholelithiasis 12/07/2016   GERD (gastroesophageal reflux disease)    History of echocardiogram    November 2008. EF 65-70%. no regional wall abnormalities. Trivial mitral regurgitation.   HTN (hypertension)    Hyperlipidemia    Mild anemia    Hx of post op. 8/08   Paroxysmal atrial fibrillation (HCC)    only episode noted breifly during hospitalization for PCM placement 11/10.    Sick sinus syndrome (HCC)    with presyncope. medtronic dual chamber PCM placed 11/10   Type II or unspecified type diabetes mellitus without mention of complication, not stated as uncontrolled    Past Surgical History:  Procedure Laterality Date   CORONARY ARTERY BYPASS GRAFT      PACEMAKER INSERTION     MDT implanted 2010   rectal abcess     2005. Dr. Curvin with interanal sphincterotomy    reports that he quit smoking about 17 years ago. His smoking use included cigarettes. He started smoking about 37 years ago. He has a 20 pack-year smoking history. He has never used smokeless tobacco. He reports current alcohol use of about 2.0 standard drinks of alcohol per week. He reports that he does not use drugs. family history is not on file. Allergies  Allergen Reactions   Metformin  And Related     rash   Current Outpatient Medications on File Prior to Visit  Medication Sig Dispense Refill   amLODipine  (NORVASC ) 5 MG tablet TAKE 1 TABLET(5 MG) BY MOUTH DAILY 90 tablet 3   aspirin  81 MG tablet Take 1 tablet (81 mg total) by mouth daily.     atorvastatin  (LIPITOR) 40 MG tablet TAKE 1 TABLET BY MOUTH DAILY AT 6 PM 90 tablet 3   Cholecalciferol (THERA-D 2000) 50 MCG (2000 UT) TABS 1 tab by mouth once per day 90 tablet 99   ezetimibe  (ZETIA ) 10 MG tablet TAKE 1 TABLET(10 MG) BY MOUTH DAILY 90 tablet 3   fenofibrate  (TRICOR ) 145 MG tablet Take 1 tablet (  145 mg total) by mouth daily. 90 tablet 3   lisinopril  (ZESTRIL ) 40 MG tablet TAKE 1 TABLET(40 MG) BY MOUTH DAILY 90 tablet 3   loratadine  (CLARITIN ) 10 MG tablet Take 1 tablet (10 mg total) by mouth daily. 30 tablet 2   metoprolol  tartrate (LOPRESSOR ) 50 MG tablet Take 1 tablet (50 mg total) by mouth 2 (two) times daily. 180 tablet 3   nitroGLYCERIN  (NITROSTAT ) 0.4 MG SL tablet Place 1 tablet (0.4 mg total) under the tongue every 5 (five) minutes as needed for chest pain. 15 tablet 3   Omega-3 Fatty Acids (OMEGA-3 FISH OIL) 1200 MG CAPS Take 1 capsule by mouth 2 (two) times daily.     omeprazole  (PRILOSEC) 20 MG capsule TAKE 1 CAPSULE BY MOUTH TWICE DAILY BEFORE A MEAL 180 capsule 3   vitamin B-12 (CYANOCOBALAMIN ) 50 MCG tablet Take 50 mcg by mouth daily.     No current facility-administered medications on file prior to  visit.        ROS:  All others reviewed and negative.  Objective        PE:  BP 132/78   Pulse 68   Temp 98.3 F (36.8 C)   Ht 5' 3.5 (1.613 m)   Wt 173 lb 12.8 oz (78.8 kg)   SpO2 99%   BMI 30.30 kg/m                 Constitutional: Pt appears in NAD               HENT: Head: NCAT.                Right Ear: External ear normal.                 Left Ear: External ear normal.                Eyes: . Pupils are equal, round, and reactive to light. Conjunctivae and EOM are normal               Nose: without d/c or deformity               Neck: Neck supple. Gross normal ROM               Cardiovascular: Normal rate and regular rhythm.                 Pulmonary/Chest: Effort normal and breath sounds without rales or wheezing.                Abd:  Soft, NT, ND, + BS, no organomegaly               Neurological: Pt is alert. At baseline orientation, motor grossly intact               Skin: Skin is warm. No rashes, no other new lesions, Coles edema - none               Psychiatric: Pt behavior is normal without agitation   Micro: none  Cardiac tracings I have personally interpreted today:  none  Pertinent Radiological findings (summarize): none   Lab Results  Component Value Date   WBC 6.0 03/30/2023   HGB 12.7 (L) 03/30/2023   HCT 37.5 (L) 03/30/2023   PLT 298.0 03/30/2023   GLUCOSE 141 (H) 03/30/2023   CHOL 152 03/30/2023   TRIG 102.0 03/30/2023   HDL 74.50 03/30/2023   LDLDIRECT 55.0 12/10/2020   LDLCALC  57 03/30/2023   ALT 11 03/30/2023   AST 14 03/30/2023   NA 136 03/30/2023   K 4.4 03/30/2023   CL 99 03/30/2023   CREATININE 1.00 03/30/2023   BUN 16 03/30/2023   CO2 30 03/30/2023   TSH 1.86 03/30/2023   PSA 0.35 03/30/2023   INR 0.94 11/10/2008   HGBA1C 8.1 (H) 03/30/2023   MICROALBUR <0.7 03/30/2023   Assessment/Plan:  [name] is a 68 y.o. Asian [4] male with  has a past medical history of CAD (coronary artery disease), Cholelithiasis (12/07/2016), GERD  (gastroesophageal reflux disease), History of echocardiogram, HTN (hypertension), Hyperlipidemia, Mild anemia, Paroxysmal atrial fibrillation (HCC), Sick sinus syndrome (HCC), and Type II or unspecified type diabetes mellitus without mention of complication, not stated as uncontrolled.  Diabetes (HCC) Lab Results  Component Value Date   HGBA1C 8.1 (H) 03/30/2023   Uncontrolled with hyperglycemia, pt to continue current medical treatment farxiga  10 mg every day, but increased mounjaro  to 5 mg weekly, also continue actos  30 mg qd   Essential hypertension BP Readings from Last 3 Encounters:  09/30/23 132/78  04/29/23 124/70  03/30/23 (!) 140/80   uncontrolled, pt to continue medical treatment norvasc  5 qd   Hyperlipidemia Lab Results  Component Value Date   LDLCALC 57 03/30/2023   Stable, pt to continue current statin lipitor 40 mg every day, zetia  10 mg qd   Vitamin D  deficiency Last vitamin D  Lab Results  Component Value Date   VD25OH 36.39 03/30/2023   Low, to start oral replacement  Followup: Return in about 6 months (around 03/29/2024).  Lynwood Rush, MD 09/30/2023 1:04 PM [facility]

## 2023-09-30 NOTE — Progress Notes (Signed)
 The test results show that your current treatment is OK, as the tests are stable.  Please continue the same plan.  There is no other need for change of treatment or further evaluation based on these results, at this time.  thanks

## 2023-09-30 NOTE — Assessment & Plan Note (Signed)
 Lab Results  Component Value Date   LDLCALC 57 03/30/2023   Stable, pt to continue current statin lipitor 40 mg every day, zetia  10 mg qd

## 2023-10-05 ENCOUNTER — Telehealth: Payer: Self-pay

## 2023-10-05 ENCOUNTER — Other Ambulatory Visit (HOSPITAL_COMMUNITY): Payer: Self-pay

## 2023-10-05 NOTE — Telephone Encounter (Signed)
 Pharmacy Patient Advocate Encounter   Received notification from CoverMyMeds that prior authorization for Farxiga  10MG  tablets is required/requested.   Insurance verification completed.   The patient is insured through [employer] .   Per test claim:  [facility], JARDIANCE  is preferred by the insurance.  If suggested medication is appropriate, Please send in a new RX and discontinue this one. If not, please advise as to why it's not appropriate so that we may request a Prior Authorization. Please note, some preferred medications may still require a PA.  If the suggested medications have not been trialed and there are no contraindications to their use, the PA will not be submitted, as it will not be approved.

## 2023-10-06 MED ORDER — EMPAGLIFLOZIN 25 MG PO TABS: 25.0000 mg | ORAL_TABLET | Freq: Every day | ORAL | 3 refills | Status: AC

## 2023-10-06 NOTE — Telephone Encounter (Signed)
 Ok for change farxiga  to jardiance  25 mg every day - done erx

## 2023-10-12 NOTE — Progress Notes (Signed)
 Remote PPM Transmission

## 2023-11-10 ENCOUNTER — Ambulatory Visit: Payer: Medicare (Managed Care)

## 2023-11-10 DIAGNOSIS — I495 Sick sinus syndrome: Secondary | ICD-10-CM | POA: Diagnosis not present

## 2023-11-11 ENCOUNTER — Telehealth: Payer: Self-pay | Admitting: Cardiology

## 2023-11-11 LAB — CUP PACEART REMOTE DEVICE CHECK
Battery Impedance: 5892 Ohm
Battery Remaining Longevity: 2 mo
Battery Voltage: 2.66 V
Brady Statistic AP VP Percent: 2 %
Brady Statistic AP VS Percent: 68 %
Brady Statistic AS VP Percent: 1 %
Brady Statistic AS VS Percent: 28 %
Date Time Interrogation Session: 20251031104406
Implantable Lead Connection Status: 753985
Implantable Lead Connection Status: 753985
Implantable Lead Implant Date: 20101101
Implantable Lead Implant Date: 20101101
Implantable Lead Location: 753859
Implantable Lead Location: 753860
Implantable Lead Model: 5076
Implantable Lead Model: 5076
Implantable Pulse Generator Implant Date: 20101101
Lead Channel Impedance Value: 557 Ohm
Lead Channel Impedance Value: 576 Ohm
Lead Channel Pacing Threshold Amplitude: 0.625 V
Lead Channel Pacing Threshold Amplitude: 1 V
Lead Channel Pacing Threshold Pulse Width: 0.4 ms
Lead Channel Pacing Threshold Pulse Width: 0.4 ms
Lead Channel Setting Pacing Amplitude: 2 V
Lead Channel Setting Pacing Amplitude: 2.5 V
Lead Channel Setting Pacing Pulse Width: 0.4 ms
Lead Channel Setting Sensing Sensitivity: 5.6 mV
Zone Setting Status: 755011
Zone Setting Status: 755011

## 2023-11-11 NOTE — Telephone Encounter (Signed)
 Patient stated he was returning call from Device team regarding yesterday's missed transmission.  Patient stated he will send transmission today (10/31).

## 2023-11-11 NOTE — Telephone Encounter (Addendum)
 Called and informed patient that we successfully received his scheduled transmission  Patient asked when he needs to be seen in clinic next  This RN informed the patient that his next in office visit is 04/23/24 with Jodie Passey, PA-C  All questions addressed during call  Patient appreciative of phone call

## 2023-11-15 ENCOUNTER — Ambulatory Visit: Payer: Self-pay | Admitting: Cardiology

## 2023-11-16 NOTE — Progress Notes (Signed)
 Remote PPM Transmission

## 2023-12-12 ENCOUNTER — Ambulatory Visit: Payer: Medicare (Managed Care) | Attending: Cardiology

## 2023-12-14 LAB — CUP PACEART REMOTE DEVICE CHECK
Battery Impedance: 6597 Ohm
Battery Remaining Longevity: 1 mo — CL
Battery Voltage: 2.62 V
Brady Statistic AP VP Percent: 2 %
Brady Statistic AP VS Percent: 66 %
Brady Statistic AS VP Percent: 1 %
Brady Statistic AS VS Percent: 31 %
Date Time Interrogation Session: 20251203125329
Implantable Lead Connection Status: 753985
Implantable Lead Connection Status: 753985
Implantable Lead Implant Date: 20101101
Implantable Lead Implant Date: 20101101
Implantable Lead Location: 753859
Implantable Lead Location: 753860
Implantable Lead Model: 5076
Implantable Lead Model: 5076
Implantable Pulse Generator Implant Date: 20101101
Lead Channel Impedance Value: 531 Ohm
Lead Channel Impedance Value: 550 Ohm
Lead Channel Pacing Threshold Amplitude: 0.625 V
Lead Channel Pacing Threshold Amplitude: 1.125 V
Lead Channel Pacing Threshold Pulse Width: 0.4 ms
Lead Channel Pacing Threshold Pulse Width: 0.4 ms
Lead Channel Setting Pacing Amplitude: 2 V
Lead Channel Setting Pacing Amplitude: 2.5 V
Lead Channel Setting Pacing Pulse Width: 0.4 ms
Lead Channel Setting Sensing Sensitivity: 5.6 mV
Zone Setting Status: 755011
Zone Setting Status: 755011

## 2024-01-12 ENCOUNTER — Ambulatory Visit: Payer: Medicare (Managed Care) | Attending: Internal Medicine

## 2024-01-18 LAB — CUP PACEART REMOTE DEVICE CHECK
Battery Impedance: 7301 Ohm
Battery Remaining Longevity: 1 mo — CL
Battery Voltage: 2.59 V
Brady Statistic AP VP Percent: 2 %
Brady Statistic AP VS Percent: 64 %
Brady Statistic AS VP Percent: 1 %
Brady Statistic AS VS Percent: 33 %
Date Time Interrogation Session: 20260106122642
Implantable Lead Connection Status: 753985
Implantable Lead Connection Status: 753985
Implantable Lead Implant Date: 20101101
Implantable Lead Implant Date: 20101101
Implantable Lead Location: 753859
Implantable Lead Location: 753860
Implantable Lead Model: 5076
Implantable Lead Model: 5076
Implantable Pulse Generator Implant Date: 20101101
Lead Channel Impedance Value: 519 Ohm
Lead Channel Impedance Value: 527 Ohm
Lead Channel Pacing Threshold Amplitude: 0.625 V
Lead Channel Pacing Threshold Amplitude: 1.125 V
Lead Channel Pacing Threshold Pulse Width: 0.4 ms
Lead Channel Pacing Threshold Pulse Width: 0.4 ms
Lead Channel Setting Pacing Amplitude: 2 V
Lead Channel Setting Pacing Amplitude: 2.5 V
Lead Channel Setting Pacing Pulse Width: 0.4 ms
Lead Channel Setting Sensing Sensitivity: 5.6 mV
Zone Setting Status: 755011
Zone Setting Status: 755011

## 2024-02-06 ENCOUNTER — Telehealth: Payer: Self-pay

## 2024-02-06 NOTE — Telephone Encounter (Signed)
 Copied from CRM [id number]. Topic: General - Other >> Feb 06, 2024  4:11 PM Rea C wrote: Reason for CRM: Pt would like to know if he can carry Mounjaro  on an airplane for his travels? He is not sure.    [phone] BENNIE)

## 2024-02-07 NOTE — Telephone Encounter (Signed)
 Called and spoke with Pt

## 2024-02-12 ENCOUNTER — Ambulatory Visit: Payer: Medicare (Managed Care)

## 2024-02-15 ENCOUNTER — Telehealth: Payer: Self-pay | Admitting: Cardiology

## 2024-02-15 NOTE — Telephone Encounter (Signed)
 Patient was called regarding missed remote transmisson.

## 2024-03-14 ENCOUNTER — Ambulatory Visit: Payer: Medicare (Managed Care)

## 2024-03-30 ENCOUNTER — Ambulatory Visit: Payer: Medicare (Managed Care) | Admitting: Internal Medicine

## 2024-04-06 ENCOUNTER — Ambulatory Visit: Payer: Medicare (Managed Care) | Admitting: Cardiology

## 2024-04-06 ENCOUNTER — Ambulatory Visit: Payer: Medicare (Managed Care) | Admitting: Internal Medicine

## 2024-04-11 ENCOUNTER — Encounter: Payer: Medicare (Managed Care) | Admitting: Physician Assistant

## 2024-04-14 ENCOUNTER — Ambulatory Visit: Payer: Medicare (Managed Care)

## 2024-05-15 ENCOUNTER — Ambulatory Visit: Payer: Medicare (Managed Care)

## 2024-09-19 ENCOUNTER — Ambulatory Visit: Payer: Medicare (Managed Care)
